# Patient Record
Sex: Female | Born: 1975
Health system: Southern US, Community
[De-identification: ages and names within clinical notes are randomized; demographics above are authoritative.]

## PROBLEM LIST (undated history)

## (undated) DIAGNOSIS — G43909 Migraine, unspecified, not intractable, without status migrainosus: Secondary | ICD-10-CM

## (undated) DIAGNOSIS — I509 Heart failure, unspecified: Secondary | ICD-10-CM

## (undated) DIAGNOSIS — K279 Peptic ulcer, site unspecified, unspecified as acute or chronic, without hemorrhage or perforation: Principal | ICD-10-CM

## (undated) DIAGNOSIS — E039 Hypothyroidism, unspecified: Secondary | ICD-10-CM

## (undated) DIAGNOSIS — J449 Chronic obstructive pulmonary disease, unspecified: Secondary | ICD-10-CM

## (undated) DIAGNOSIS — R569 Unspecified convulsions: Secondary | ICD-10-CM

## (undated) DIAGNOSIS — R5383 Other fatigue: Secondary | ICD-10-CM

## (undated) DIAGNOSIS — K5790 Diverticulosis of intestine, part unspecified, without perforation or abscess without bleeding: Secondary | ICD-10-CM

## (undated) DIAGNOSIS — F411 Generalized anxiety disorder: Secondary | ICD-10-CM

## (undated) DIAGNOSIS — D649 Anemia, unspecified: Secondary | ICD-10-CM

## (undated) DIAGNOSIS — E041 Nontoxic single thyroid nodule: Secondary | ICD-10-CM

## (undated) DIAGNOSIS — K219 Gastro-esophageal reflux disease without esophagitis: Secondary | ICD-10-CM

## (undated) DIAGNOSIS — Z9889 Other specified postprocedural states: Secondary | ICD-10-CM

## (undated) DIAGNOSIS — Z8639 Personal history of other endocrine, nutritional and metabolic disease: Secondary | ICD-10-CM

## (undated) DIAGNOSIS — I209 Angina pectoris, unspecified: Secondary | ICD-10-CM

## (undated) DIAGNOSIS — R112 Nausea with vomiting, unspecified: Secondary | ICD-10-CM

## (undated) DIAGNOSIS — R5381 Other malaise: Secondary | ICD-10-CM

## (undated) DIAGNOSIS — K760 Fatty (change of) liver, not elsewhere classified: Secondary | ICD-10-CM

## (undated) DIAGNOSIS — O903 Peripartum cardiomyopathy: Secondary | ICD-10-CM

## (undated) DIAGNOSIS — K5289 Other specified noninfective gastroenteritis and colitis: Secondary | ICD-10-CM

## (undated) DIAGNOSIS — N393 Stress incontinence (female) (male): Secondary | ICD-10-CM

## (undated) DIAGNOSIS — R0789 Other chest pain: Secondary | ICD-10-CM

## (undated) DIAGNOSIS — J45909 Unspecified asthma, uncomplicated: Secondary | ICD-10-CM

## (undated) DIAGNOSIS — E669 Obesity, unspecified: Secondary | ICD-10-CM

## (undated) DIAGNOSIS — R072 Precordial pain: Secondary | ICD-10-CM

## (undated) DIAGNOSIS — R609 Edema, unspecified: Secondary | ICD-10-CM

## (undated) DIAGNOSIS — Z720 Tobacco use: Secondary | ICD-10-CM

## (undated) DIAGNOSIS — M1990 Unspecified osteoarthritis, unspecified site: Secondary | ICD-10-CM

## (undated) DIAGNOSIS — R635 Abnormal weight gain: Secondary | ICD-10-CM

## (undated) HISTORY — DX: Generalized anxiety disorder: F41.1

## (undated) HISTORY — PX: APPENDECTOMY: SHX54

## (undated) HISTORY — DX: Migraine, unspecified, not intractable, without status migrainosus: G43.909

## (undated) HISTORY — DX: Edema, unspecified: R60.9

## (undated) HISTORY — DX: Unspecified convulsions: R56.9

## (undated) HISTORY — DX: Peripartum cardiomyopathy: O90.3

## (undated) HISTORY — PX: CHOLECYSTECTOMY: SHX55

## (undated) HISTORY — DX: Tobacco use: Z72.0

## (undated) HISTORY — DX: Other malaise: R53.81

## (undated) HISTORY — DX: Other fatigue: R53.83

## (undated) HISTORY — DX: Gastro-esophageal reflux disease without esophagitis: K21.9

## (undated) HISTORY — DX: Heart failure, unspecified: I50.9

## (undated) HISTORY — DX: Other specified noninfective gastroenteritis and colitis: K52.89

## (undated) HISTORY — PX: KNEE ARTHROSCOPY: SHX127

## (undated) HISTORY — DX: Abnormal weight gain: R63.5

## (undated) HISTORY — DX: Other chest pain: R07.89

## (undated) HISTORY — DX: Peptic ulcer, site unspecified, unspecified as acute or chronic, without hemorrhage or perforation: K27.9

## (undated) HISTORY — DX: Precordial pain: R07.2

## (undated) HISTORY — PX: ANKLE SURGERY: SHX546

---

## 1997-05-03 DIAGNOSIS — I509 Heart failure, unspecified: Secondary | ICD-10-CM

## 1997-05-03 HISTORY — DX: Heart failure, unspecified: I50.9

## 1997-08-12 ENCOUNTER — Inpatient Hospital Stay (HOSPITAL_COMMUNITY): Admission: AD | Admit: 1997-08-12 | Discharge: 1997-08-12 | Payer: Self-pay | Admitting: Obstetrics and Gynecology

## 1997-08-13 ENCOUNTER — Inpatient Hospital Stay (HOSPITAL_COMMUNITY): Admission: AD | Admit: 1997-08-13 | Discharge: 1997-08-15 | Payer: Self-pay | Admitting: Obstetrics and Gynecology

## 1997-09-01 ENCOUNTER — Inpatient Hospital Stay (HOSPITAL_COMMUNITY): Admission: AD | Admit: 1997-09-01 | Discharge: 1997-09-01 | Payer: Self-pay | Admitting: Obstetrics and Gynecology

## 1997-09-02 ENCOUNTER — Inpatient Hospital Stay (HOSPITAL_COMMUNITY): Admission: AD | Admit: 1997-09-02 | Discharge: 1997-09-10 | Payer: Self-pay | Admitting: *Deleted

## 1997-10-24 ENCOUNTER — Other Ambulatory Visit: Admission: RE | Admit: 1997-10-24 | Discharge: 1997-10-24 | Payer: Self-pay | Admitting: Obstetrics and Gynecology

## 1999-02-22 ENCOUNTER — Emergency Department (HOSPITAL_COMMUNITY): Admission: EM | Admit: 1999-02-22 | Discharge: 1999-02-22 | Payer: Self-pay | Admitting: Emergency Medicine

## 1999-02-27 ENCOUNTER — Encounter: Payer: Self-pay | Admitting: Cardiovascular Disease

## 1999-02-27 ENCOUNTER — Ambulatory Visit (HOSPITAL_COMMUNITY): Admission: RE | Admit: 1999-02-27 | Discharge: 1999-02-27 | Payer: Self-pay | Admitting: Cardiovascular Disease

## 1999-03-07 ENCOUNTER — Encounter: Payer: Self-pay | Admitting: Emergency Medicine

## 1999-03-08 ENCOUNTER — Inpatient Hospital Stay (HOSPITAL_COMMUNITY): Admission: EM | Admit: 1999-03-08 | Discharge: 1999-03-09 | Payer: Self-pay | Admitting: Emergency Medicine

## 2000-08-24 ENCOUNTER — Encounter: Payer: Self-pay | Admitting: Obstetrics and Gynecology

## 2000-08-24 ENCOUNTER — Encounter: Admission: RE | Admit: 2000-08-24 | Discharge: 2000-08-24 | Payer: Self-pay | Admitting: Obstetrics and Gynecology

## 2001-11-16 ENCOUNTER — Inpatient Hospital Stay (HOSPITAL_COMMUNITY): Admission: AD | Admit: 2001-11-16 | Discharge: 2001-11-16 | Payer: Self-pay

## 2002-01-25 ENCOUNTER — Ambulatory Visit (HOSPITAL_COMMUNITY): Admission: RE | Admit: 2002-01-25 | Discharge: 2002-01-25 | Payer: Self-pay

## 2002-02-15 ENCOUNTER — Ambulatory Visit (HOSPITAL_COMMUNITY): Admission: RE | Admit: 2002-02-15 | Discharge: 2002-02-15 | Payer: Self-pay

## 2002-05-31 ENCOUNTER — Inpatient Hospital Stay (HOSPITAL_COMMUNITY): Admission: AD | Admit: 2002-05-31 | Discharge: 2002-05-31 | Payer: Self-pay

## 2002-06-13 ENCOUNTER — Encounter (HOSPITAL_COMMUNITY): Admission: RE | Admit: 2002-06-13 | Discharge: 2002-06-20 | Payer: Self-pay

## 2002-06-27 ENCOUNTER — Inpatient Hospital Stay (HOSPITAL_COMMUNITY): Admission: AD | Admit: 2002-06-27 | Discharge: 2002-06-30 | Payer: Self-pay

## 2002-06-27 ENCOUNTER — Encounter (INDEPENDENT_AMBULATORY_CARE_PROVIDER_SITE_OTHER): Payer: Self-pay

## 2005-06-22 ENCOUNTER — Emergency Department (HOSPITAL_COMMUNITY): Admission: EM | Admit: 2005-06-22 | Discharge: 2005-06-22 | Payer: Self-pay | Admitting: Emergency Medicine

## 2005-06-23 ENCOUNTER — Emergency Department (HOSPITAL_COMMUNITY): Admission: EM | Admit: 2005-06-23 | Discharge: 2005-06-23 | Payer: Self-pay | Admitting: Emergency Medicine

## 2005-06-23 ENCOUNTER — Inpatient Hospital Stay (HOSPITAL_COMMUNITY): Admission: AD | Admit: 2005-06-23 | Discharge: 2005-06-25 | Payer: Self-pay | Admitting: Psychiatry

## 2005-06-24 ENCOUNTER — Ambulatory Visit: Payer: Self-pay | Admitting: Psychiatry

## 2005-07-07 ENCOUNTER — Ambulatory Visit (HOSPITAL_COMMUNITY): Payer: Self-pay | Admitting: Psychiatry

## 2005-09-16 ENCOUNTER — Emergency Department (HOSPITAL_COMMUNITY): Admission: EM | Admit: 2005-09-16 | Discharge: 2005-09-17 | Payer: Self-pay | Admitting: Emergency Medicine

## 2005-09-17 ENCOUNTER — Ambulatory Visit (HOSPITAL_COMMUNITY): Admission: RE | Admit: 2005-09-17 | Discharge: 2005-09-17 | Payer: Self-pay | Admitting: Family Medicine

## 2005-09-17 ENCOUNTER — Encounter: Payer: Self-pay | Admitting: Vascular Surgery

## 2005-09-24 ENCOUNTER — Ambulatory Visit: Payer: Self-pay | Admitting: Psychiatry

## 2005-09-24 ENCOUNTER — Inpatient Hospital Stay (HOSPITAL_COMMUNITY): Admission: RE | Admit: 2005-09-24 | Discharge: 2005-09-25 | Payer: Self-pay | Admitting: Psychiatry

## 2005-11-21 ENCOUNTER — Emergency Department (HOSPITAL_COMMUNITY): Admission: EM | Admit: 2005-11-21 | Discharge: 2005-11-21 | Payer: Self-pay | Admitting: Emergency Medicine

## 2006-07-28 ENCOUNTER — Ambulatory Visit (HOSPITAL_COMMUNITY): Payer: Self-pay | Admitting: Psychiatry

## 2006-08-12 ENCOUNTER — Ambulatory Visit (HOSPITAL_COMMUNITY): Payer: Self-pay | Admitting: Psychiatry

## 2006-10-14 ENCOUNTER — Ambulatory Visit (HOSPITAL_COMMUNITY): Payer: Self-pay | Admitting: Psychiatry

## 2006-11-12 ENCOUNTER — Emergency Department (HOSPITAL_COMMUNITY): Admission: EM | Admit: 2006-11-12 | Discharge: 2006-11-12 | Payer: Self-pay | Admitting: Emergency Medicine

## 2007-03-28 ENCOUNTER — Ambulatory Visit (HOSPITAL_COMMUNITY): Payer: Self-pay | Admitting: Psychiatry

## 2007-04-16 ENCOUNTER — Emergency Department (HOSPITAL_COMMUNITY): Admission: EM | Admit: 2007-04-16 | Discharge: 2007-04-16 | Payer: Self-pay | Admitting: Emergency Medicine

## 2007-04-18 ENCOUNTER — Ambulatory Visit (HOSPITAL_COMMUNITY): Payer: Self-pay | Admitting: Psychiatry

## 2007-05-18 ENCOUNTER — Ambulatory Visit (HOSPITAL_COMMUNITY): Payer: Self-pay | Admitting: Psychiatry

## 2007-06-14 ENCOUNTER — Ambulatory Visit (HOSPITAL_COMMUNITY): Payer: Self-pay | Admitting: Psychiatry

## 2007-09-28 ENCOUNTER — Ambulatory Visit: Payer: Self-pay | Admitting: Internal Medicine

## 2007-09-28 DIAGNOSIS — R5381 Other malaise: Secondary | ICD-10-CM

## 2007-09-28 DIAGNOSIS — R569 Unspecified convulsions: Secondary | ICD-10-CM

## 2007-09-28 DIAGNOSIS — R5383 Other fatigue: Secondary | ICD-10-CM

## 2007-09-28 DIAGNOSIS — K279 Peptic ulcer, site unspecified, unspecified as acute or chronic, without hemorrhage or perforation: Secondary | ICD-10-CM | POA: Insufficient documentation

## 2007-09-28 DIAGNOSIS — R635 Abnormal weight gain: Secondary | ICD-10-CM

## 2007-09-28 HISTORY — DX: Unspecified convulsions: R56.9

## 2007-09-28 HISTORY — DX: Other malaise: R53.81

## 2007-09-28 HISTORY — DX: Peptic ulcer, site unspecified, unspecified as acute or chronic, without hemorrhage or perforation: K27.9

## 2007-09-28 HISTORY — DX: Abnormal weight gain: R63.5

## 2007-09-28 LAB — CONVERTED CEMR LAB
Alkaline Phosphatase: 59 units/L (ref 39–117)
Bilirubin, Direct: 0.1 mg/dL (ref 0.0–0.3)
CO2: 27 meq/L (ref 19–32)
GFR calc Af Amer: 123 mL/min
Glucose, Bld: 83 mg/dL (ref 70–99)
Lymphocytes Relative: 26.4 % (ref 12.0–46.0)
Monocytes Absolute: 0.8 10*3/uL (ref 0.1–1.0)
Monocytes Relative: 11.8 % (ref 3.0–12.0)
Platelets: 238 10*3/uL (ref 150–400)
Potassium: 3.9 meq/L (ref 3.5–5.1)
RDW: 11.8 % (ref 11.5–14.6)
Sodium: 137 meq/L (ref 135–145)
Total Protein: 6.8 g/dL (ref 6.0–8.3)

## 2007-09-29 ENCOUNTER — Telehealth: Payer: Self-pay | Admitting: Internal Medicine

## 2007-10-04 ENCOUNTER — Ambulatory Visit: Payer: Self-pay | Admitting: Internal Medicine

## 2007-10-06 ENCOUNTER — Telehealth: Payer: Self-pay | Admitting: Internal Medicine

## 2007-10-12 ENCOUNTER — Ambulatory Visit: Payer: Self-pay | Admitting: Internal Medicine

## 2007-10-12 DIAGNOSIS — F419 Anxiety disorder, unspecified: Secondary | ICD-10-CM | POA: Insufficient documentation

## 2007-10-12 DIAGNOSIS — F411 Generalized anxiety disorder: Secondary | ICD-10-CM

## 2007-10-12 HISTORY — DX: Generalized anxiety disorder: F41.1

## 2007-10-12 LAB — CONVERTED CEMR LAB
BUN: 7 mg/dL (ref 6–23)
CO2: 29 meq/L (ref 19–32)
GFR calc Af Amer: 123 mL/min
Glucose, Bld: 88 mg/dL (ref 70–99)
Potassium: 3.6 meq/L (ref 3.5–5.1)

## 2008-05-12 ENCOUNTER — Emergency Department (HOSPITAL_COMMUNITY): Admission: EM | Admit: 2008-05-12 | Discharge: 2008-05-12 | Payer: Self-pay | Admitting: Emergency Medicine

## 2008-10-03 ENCOUNTER — Ambulatory Visit: Payer: Self-pay | Admitting: Internal Medicine

## 2008-10-03 DIAGNOSIS — K5289 Other specified noninfective gastroenteritis and colitis: Secondary | ICD-10-CM

## 2008-10-03 HISTORY — DX: Other specified noninfective gastroenteritis and colitis: K52.89

## 2009-12-03 ENCOUNTER — Ambulatory Visit: Payer: Self-pay | Admitting: Family Medicine

## 2010-01-30 ENCOUNTER — Encounter (INDEPENDENT_AMBULATORY_CARE_PROVIDER_SITE_OTHER): Payer: Self-pay | Admitting: Internal Medicine

## 2010-01-30 ENCOUNTER — Ambulatory Visit: Payer: Self-pay | Admitting: Cardiovascular Disease

## 2010-01-30 ENCOUNTER — Observation Stay (HOSPITAL_COMMUNITY): Admission: EM | Admit: 2010-01-30 | Discharge: 2010-01-30 | Payer: Self-pay | Admitting: Emergency Medicine

## 2010-02-02 ENCOUNTER — Encounter: Payer: Self-pay | Admitting: Cardiovascular Disease

## 2010-02-11 ENCOUNTER — Encounter: Payer: Self-pay | Admitting: Cardiology

## 2010-02-17 ENCOUNTER — Telehealth (INDEPENDENT_AMBULATORY_CARE_PROVIDER_SITE_OTHER): Payer: Self-pay | Admitting: *Deleted

## 2010-02-18 DIAGNOSIS — O903 Peripartum cardiomyopathy: Secondary | ICD-10-CM

## 2010-02-18 DIAGNOSIS — R0789 Other chest pain: Secondary | ICD-10-CM

## 2010-02-18 DIAGNOSIS — R609 Edema, unspecified: Secondary | ICD-10-CM

## 2010-02-18 HISTORY — DX: Edema, unspecified: R60.9

## 2010-02-18 HISTORY — DX: Peripartum cardiomyopathy: O90.3

## 2010-02-18 HISTORY — DX: Other chest pain: R07.89

## 2010-02-19 ENCOUNTER — Encounter: Payer: Self-pay | Admitting: Cardiovascular Disease

## 2010-02-19 ENCOUNTER — Ambulatory Visit: Payer: Self-pay | Admitting: Cardiovascular Disease

## 2010-02-19 DIAGNOSIS — R072 Precordial pain: Secondary | ICD-10-CM | POA: Insufficient documentation

## 2010-02-19 HISTORY — DX: Precordial pain: R07.2

## 2010-02-25 ENCOUNTER — Ambulatory Visit: Payer: Self-pay | Admitting: Cardiology

## 2010-02-25 ENCOUNTER — Telehealth: Payer: Self-pay | Admitting: Cardiovascular Disease

## 2010-02-25 ENCOUNTER — Inpatient Hospital Stay (HOSPITAL_COMMUNITY): Admission: EM | Admit: 2010-02-25 | Discharge: 2010-02-28 | Payer: Self-pay | Admitting: Emergency Medicine

## 2010-02-25 ENCOUNTER — Encounter: Payer: Self-pay | Admitting: Cardiovascular Disease

## 2010-03-02 ENCOUNTER — Ambulatory Visit (HOSPITAL_COMMUNITY): Admission: RE | Admit: 2010-03-02 | Discharge: 2010-03-02 | Payer: Self-pay | Admitting: Cardiology

## 2010-03-04 ENCOUNTER — Ambulatory Visit: Payer: Self-pay | Admitting: Internal Medicine

## 2010-03-04 DIAGNOSIS — G43909 Migraine, unspecified, not intractable, without status migrainosus: Secondary | ICD-10-CM

## 2010-03-04 HISTORY — DX: Migraine, unspecified, not intractable, without status migrainosus: G43.909

## 2010-03-06 ENCOUNTER — Encounter: Payer: Self-pay | Admitting: Internal Medicine

## 2010-03-12 ENCOUNTER — Inpatient Hospital Stay: Admission: RE | Admit: 2010-03-12 | Payer: Self-pay | Admitting: Cardiovascular Disease

## 2010-05-12 ENCOUNTER — Ambulatory Visit
Admission: RE | Admit: 2010-05-12 | Discharge: 2010-05-12 | Payer: Self-pay | Source: Home / Self Care | Attending: Licensed Clinical Social Worker | Admitting: Licensed Clinical Social Worker

## 2010-05-21 ENCOUNTER — Ambulatory Visit: Admit: 2010-05-21 | Payer: Self-pay | Admitting: Licensed Clinical Social Worker

## 2010-05-26 ENCOUNTER — Ambulatory Visit: Admit: 2010-05-26 | Payer: Self-pay | Admitting: Licensed Clinical Social Worker

## 2010-06-04 NOTE — Letter (Signed)
Summary: [employer] Description Form   [employer] Description Form   Imported By: Roderic Ovens 03/18/2010 11:39:20  _____________________________________________________________________  External Attachment:    Type:   Image     Comment:   External Document

## 2010-06-04 NOTE — Progress Notes (Signed)
Summary: pt having chest pressure  Phone Note Call from Patient Call back at Home Phone [phone]   Caller: Patient Reason for Call: Talk to Nurse, Talk to Doctor Summary of Call: pt is having pressure in her chest and has taken off work 3days and she is very dizzy pt also has tiggling in her hands pt has not taken any nitro Initial call taken by: Omer Jack,  February 25, 2010 10:46 AM  Follow-up for Phone Call        Pt having Chest Pressure on & off for the past 3 days radiating to lt.arm, upset stomach, dizziness usually when she stands up & she also has a bad H/A. She is stating she just doesn't "feel right." She does not have any SL Ntg. Dr. Clifton James saw her on Thursday for an abnormal stress test & is supposed to cath her on Nov.10th. I have advised pt. to go to the ER to get checked out. I have notified Trish & the ER. Whitney Maeola Sarah RN  February 25, 2010 10:59 AM  Follow-up by: Whitney Maeola Sarah RN,  February 25, 2010 10:54 AM

## 2010-06-04 NOTE — Letter (Signed)
Summary: ER Notification  [facility]ural [facility], Main Office  [address]   [place], [place] [address]   Phone: [phone]  Fax: [phone]    February 25, 2010 11:00 AM  [facility]  The above referenced patient has been advised to report directly to the Emergency Room. Please see below for more information:  Dx: CP x 3days, abnormal stress test   Private Vehicle  _______X________ or EMS:  ________________   Orders:  Yes ______ or No  ______X_   Notify upon arrival:     Trish [phone]- done         Thank you,    Forensic psychologist

## 2010-06-04 NOTE — Letter (Signed)
Summary: [facility]  [facility]   Imported By: Sherian Rein 03/17/2010 12:16:28  _____________________________________________________________________  External Attachment:    Type:   Image     Comment:   External Document

## 2010-06-04 NOTE — Assessment & Plan Note (Signed)
Summary: eph/per night message/lg   Visit Type:  Initial Consult/ eph Primary Provider:  Gordy Savers  MD  CC:  chest pressure and sob.  History of Present Illness: 35 yo WF with history GERD, famly history of CAD, tobacco abuse who was recently discharged from [facility] for chest pain. She ruled out for an MI with enzymes and had a normal echo. Her chest pain was felt to be secondary to GERD. She was discharged home with plans for an outpatient stress test. She went home and had recurrent chest pain and went to [facility] medical center where she was admitted for rule out. (records not available) She apparently had a stress echo that was normal. She was discharged to home with plans to follow up with me today. She continues to have left sided chest pressure with rest. No radiation of the pressure. No associated  diaphoresis, nausea. It does make her left fingers feel numb and makes her feel dyspneic. She overall feels fatigued.   Current Medications (verified): 1)  Aleve 220 Mg Tabs (Naproxen Sodium) .... 2 Tab As Needed  Allergies (verified): 1)  ! Ms Contin (Morphine Sulfate)  Past History:  Past Medical History: Pre-eclampsia in 2000 during pregnancy, pulmonary edema GERD Tobacco abuse  Past Surgical History: Reviewed history from 02/18/2010 and no changes required. gravida 3, para 3, abortus zero, with 3 C-sections Appendectomy Cholecystectomy Knee Arthroscopy Ankle surgery  Family History:  Father had a myocardial infarction at age  14 and recently died.   Mother is alive, had a cerebral aneurysm and  colon cancer.  2 sisters-both healthy 3 brothers-one died from muscular dystrophy, one died at age 65 and died from complications of diabetes, one died at age 37 from drunk diriver.   Social History: Divorced, 3 kids Works at [employer] Tobacco use- 1ppd for 20 years Alcohol Use - no Drug Use - no        Review of Systems       The patient  complains of fatigue, chest pain, and shortness of breath.  The patient denies malaise, fever, weight gain/loss, vision loss, decreased hearing, hoarseness, palpitations, prolonged cough, wheezing, sleep apnea, coughing up blood, abdominal pain, blood in stool, nausea, vomiting, diarrhea, heartburn, incontinence, blood in urine, muscle weakness, joint pain, leg swelling, rash, skin lesions, headache, fainting, dizziness, depression, anxiety, enlarged lymph nodes, easy bruising or bleeding, and environmental allergies.    Vital Signs:  Patient profile:   35 year old female Height:      66 inches Weight:      172 pounds BMI:     27.86 Pulse rate:   83 / minute BP sitting:   122 / 78  (left arm) Cuff size:   regular  Vitals Entered By: Burnett Kanaris, CNA (February 19, 2010 3:54 PM)  Physical Exam  General:  General: Well developed, well nourished, NAD HEENT: OP clear, mucus membranes moist SKIN: warm, dry Neuro: No focal deficits Musculoskeletal: Muscle strength 5/5 all ext Psychiatric: Mood and affect normal Neck: No JVD, no carotid bruits, no thyromegaly, no lymphadenopathy. Lungs:Clear bilaterally, no wheezes, rhonci, crackles CV: RRR no murmurs, gallops rubs Abdomen: soft, NT, ND, BS present Extremities: No edema, pulses 2+.    EKG  Procedure date:  02/19/2010  Findings:      Normal sinus rhythm  Echocardiogram  Procedure date:  01/30/2010  Findings:       Left ventricle: The cavity size was normal. Wall thickness was   normal.  Systolic function was normal. The estimated ejection   fraction was in the range of 60% to 65%. Wall motion was normal;   there were no regional wall motion abnormalities. Left ventricular   diastolic function parameters were normal.  Impression & Recommendations:  Problem # 1:  CHEST PAIN-PRECORDIAL (ICD-786.51) Her risk factors for CAD include tobacco abuse and a family history of CAD. She has had a recent stress echo at [facility] which showed no ischemia. Her chest pain is occurring daily, with rest and with exertion. I cannot exclude CAD without an angiogram. She is very interested in excluding CAD. I have arranged a diagnostic left heart cath for November 10th in the outpatient cath lab at [facility]. Risks and benefits reviewed. She wishes to proceed. Will check BMET, CBC and coags that week.   Tobacco cessation encouraged.   Other Orders: EKG w/ Interpretation (93000) Cardiac Catheterization (Cardiac Cath)  Patient Instructions: 1)  Your physician recommends that you schedule a follow-up appointment in: 5-6 weeks.  2)  Your physician recommends that you return for lab work on Nov.8th, 2011 @ any time after 8:30 am. 3)  Your physician recommends that you continue on your current medications as directed. Please refer to the Current Medication list given to you today. 4)  Your physician has requested that you have a cardiac catheterization on Nov.10th, 2011 @ 10:30am. Be there at 9:30am.  Cardiac catheterization is used to diagnose and/or treat various heart conditions. Doctors may recommend this procedure for a number of different reasons. The most common reason is to evaluate chest pain. Chest pain can be a symptom of coronary artery disease (CAD), and cardiac catheterization can show whether plaque is narrowing or blocking your heart's arteries. This procedure is also used to evaluate the valves, as well as measure the blood flow and oxygen levels in different parts of your heart.  For further information please visit [email or web address]  Please follow instruction sheet, as given.
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
## 2010-06-04 NOTE — Progress Notes (Signed)
  ROI faxed to [facility] @ [phone] [facility]  February 17, 2010 9:51 AM     Appended Document:  Received Stress form [facility] gave to [place] (covering for [place])

## 2010-06-04 NOTE — Letter (Signed)
Summary: [facility] - Exercise Stress Echo W/Doppler  [facility] - Exercise Stress Echo W/Doppler   Imported By: Marylou Mccoy 03/05/2010 13:08:47  _____________________________________________________________________  External Attachment:    Type:   Image     Comment:   External Document

## 2010-06-04 NOTE — Assessment & Plan Note (Signed)
Summary: post hos fup/njr   Vital Signs:  Patient profile:   35 year old female Weight:      168 pounds Temp:     98.2 degrees F oral BP sitting:   106 / 80  (left arm) Cuff size:   regular  Vitals Entered By: Duard Brady LPN (March 04, 2010 10:56 AM) CC: post hospital - heart Lisa Ryan - neuro still evaling - doing better Is Patient Diabetic? No   Primary Care Provider:  Gordy Savers  MD  CC:  post hospital - heart Lisa Ryan - neuro still evaling - doing better.  History of Present Illness: 35 -year-old patient who is seen today posthospital l discharge.  She was admitted for evaluation of atypical chest pain, and did have a heart catheterization, performed with normal results.  She also was seen by urology due to a severe headache and was felt to have a migraine syndrome.  Her headaches have largely resolved, and only has minor and improving headache pain.  She does have a family history of a first degree relative with cerebral  aneurysms and was quite concerned about this possibility.  A head CT without contrast was negative in an inpatient setting.  Subsequently, she has had a MRI without contrast that was fairly unremarkable.  She request a neurology follow up  Allergies: 1)  ! Ms Contin (Morphine Sulfate)  Past History:  Past Medical History: Pre-eclampsia in 2000 during pregnancy, pulmonary edema GERD Tobacco abuse migraine headaches atypical chest pain  Family History: Reviewed history from 02/19/2010 and no changes required.  Father had a myocardial infarction at age  52 and recently died.   Mother is alive, had a cerebral aneurysm and  colon cancer.  2 sisters-both healthy 3 brothers-one died from muscular dystrophy, one died at age 58 and died from complications of diabetes, one died at age 3 from drunk diriver.   Review of Systems       The patient complains of chest pain and headaches.  The patient denies anorexia, fever, weight loss, weight  gain, vision loss, decreased hearing, hoarseness, syncope, dyspnea on exertion, peripheral edema, prolonged cough, hemoptysis, abdominal pain, melena, hematochezia, severe indigestion/heartburn, hematuria, incontinence, genital sores, muscle weakness, suspicious skin lesions, transient blindness, difficulty walking, depression, unusual weight change, abnormal bleeding, enlarged lymph nodes, angioedema, and breast masses.    Physical Exam  General:  Well-developed,well-nourished,in no acute distress; alert,appropriate and cooperative throughout examination Head:  Normocephalic and atraumatic without obvious abnormalities. No apparent alopecia or balding. Mouth:  Oral mucosa and oropharynx without lesions or exudates.  Teeth in good repair. Neck:  No deformities, masses, or tenderness noted. Lungs:  Normal respiratory effort, chest expands symmetrically. Lungs are clear to auscultation, no crackles or wheezes. Heart:  Normal rate and regular rhythm. S1 and S2 normal without gallop, murmur, click, rub or other extra sounds. Psych:  anxious   Impression & Recommendations:  Problem # 1:  CHEST PAIN-PRECORDIAL (ICD-786.51)  Her updated medication list for this problem includes:    Aleve 220 Mg Tabs (Naproxen sodium) .Marland Kitchen... 2 tab as needed  Problem # 2:  MIGRAINE HEADACHE (ICD-346.90)  Her updated medication list for this problem includes:    Aleve 220 Mg Tabs (Naproxen sodium) .Marland Kitchen... 2 tab as needed  Problem # 3:  ANXIETY STATE, UNSPECIFIED (ICD-300.00)  Complete Medication List: 1)  Aleve 220 Mg Tabs (Naproxen sodium) .... 2 tab as needed 2)  Prednisone (pak) 5 Mg Tabs (Prednisone) .... As directed  Patient Instructions: 1)  Please schedule a follow-up appointment in 2 weeks. 2)  Neurology follow up as scheduled   Orders Added: 1)  Est. Patient Level IV [[address]]

## 2010-06-04 NOTE — Letter (Signed)
Summary: Cardiac Catheterization Instructions- [facility]  [employer], Main Office  [address]   [place], [place] [address]   Phone: [phone]  Fax: [phone]     02/19/2010 MRN: [id number]  [facility] [address] [place], [place]  [address]  Dear Ms. MCCRAY,   You are scheduled for a Cardiac Catheterization on Nov.10th, 2011 with Dr. Clifton James. Please arrive to the 1st floor of the Heart and Vascular Center at [facility] at 9:30 am on the day of your procedure. Please do not arrive before 6:30 a.m. Call the Heart and Vascular Center at [phone] if you are unable to make your appointmnet. The Code to get into the parking garage under the building is 2000. Take the elevators to the 1st floor. You must have someone to drive you home. Someone must be with you for the first 24 hours after you arrive home. Please wear clothes that are easy to get on and off and wear slip-on shoes. Do not eat or drink after midnight except water with your medications that morning. Bring all your medications and current insurance cards with you.  __X_ Make sure you take your aspirin.  _X__ You may take ALL of your medications with water that morning.   The usual length of stay after your procedure is 2 to 3 hours. This can vary.  If you have any questions, please call the office at the number listed above.   Whitney Maeola Sarah RN

## 2010-06-04 NOTE — Letter (Signed)
Summary: Out of Work  Adult nurse at [employer]  [address]   [place], [place] [address]   Phone: [phone]  Fax: [phone]    March 04, 2010   Employee:  KAAREN NASS [facility] [employer]    To Whom It May Concern:   For Medical reasons, please excuse the above named employee from work for the following dates:  Start:   02-23-10  End:   03-10-10  If you need additional information, please feel free to contact our office.         Sincerely,    Gordy Savers  MD

## 2010-07-15 LAB — CBC
HCT: 35.3 % — ABNORMAL LOW (ref 36.0–46.0)
HCT: 39.8 % (ref 36.0–46.0)
Hemoglobin: 12 g/dL (ref 12.0–15.0)
Hemoglobin: 13.6 g/dL (ref 12.0–15.0)
MCH: 33 pg (ref 26.0–34.0)
MCH: 33.2 pg (ref 26.0–34.0)
MCHC: 34 g/dL (ref 30.0–36.0)
MCHC: 34.2 g/dL (ref 30.0–36.0)
MCV: 97.1 fL (ref 78.0–100.0)

## 2010-07-15 LAB — CARDIAC PANEL(CRET KIN+CKTOT+MB+TROPI)
CK, MB: 0.6 ng/mL (ref 0.3–4.0)
Relative Index: INVALID (ref 0.0–2.5)
Total CK: 33 U/L (ref 7–177)
Total CK: 34 U/L (ref 7–177)
Troponin I: <0.01 ng/mL (ref 0.00–0.06)

## 2010-07-15 LAB — D-DIMER, QUANTITATIVE: D-Dimer, Quant: 0.22 ug/mL-FEU (ref 0.00–0.48)

## 2010-07-15 LAB — DIFFERENTIAL
Basophils Relative: 0 % (ref 0–1)
Eosinophils Absolute: 0.3 10*3/uL (ref 0.0–0.7)
Eosinophils Relative: 3 % (ref 0–5)
Neutrophils Relative %: 67 % (ref 43–77)

## 2010-07-15 LAB — BASIC METABOLIC PANEL
BUN: 7 mg/dL (ref 6–23)
Calcium: 8.5 mg/dL (ref 8.4–10.5)
GFR calc non Af Amer: >60 mL/min (ref >60)
Glucose, Bld: 84 mg/dL (ref 70–99)
Sodium: 139 mEq/L (ref 135–145)

## 2010-07-15 LAB — COMPREHENSIVE METABOLIC PANEL
ALT: 24 U/L (ref 0–35)
AST: 18 U/L (ref 0–37)
Alkaline Phosphatase: 80 U/L (ref 39–117)
CO2: 24 mEq/L (ref 19–32)
Chloride: 109 mEq/L (ref 96–112)
GFR calc non Af Amer: >60 mL/min (ref >60)
Glucose, Bld: 74 mg/dL (ref 70–99)
Potassium: 3.5 mEq/L (ref 3.5–5.1)
Sodium: 140 mEq/L (ref 135–145)
Total Bilirubin: 0.5 mg/dL (ref 0.3–1.2)

## 2010-07-15 LAB — CK TOTAL AND CKMB (NOT AT ARMC): Relative Index: INVALID (ref 0.0–2.5)

## 2010-07-15 LAB — PROTIME-INR: Prothrombin Time: 12.3 seconds (ref 11.6–15.2)

## 2010-07-16 LAB — CBC
HCT: 33.9 % — ABNORMAL LOW (ref 36.0–46.0)
MCH: 33 pg (ref 26.0–34.0)
MCHC: 33.9 g/dL (ref 30.0–36.0)
MCV: 94.7 fL (ref 78.0–100.0)
MCV: 95.5 fL (ref 78.0–100.0)
Platelets: 308 10*3/uL (ref 150–400)
RDW: 12.2 % (ref 11.5–15.5)
RDW: 12.3 % (ref 11.5–15.5)

## 2010-07-16 LAB — BASIC METABOLIC PANEL
GFR calc Af Amer: >60 mL/min (ref >60)
GFR calc non Af Amer: >60 mL/min (ref >60)
Potassium: 3.9 mEq/L (ref 3.5–5.1)
Sodium: 138 mEq/L (ref 135–145)

## 2010-07-16 LAB — COMPREHENSIVE METABOLIC PANEL
BUN: 5 mg/dL — ABNORMAL LOW (ref 6–23)
Calcium: 8.1 mg/dL — ABNORMAL LOW (ref 8.4–10.5)
Creatinine, Ser: 0.55 mg/dL (ref 0.4–1.2)
Glucose, Bld: 86 mg/dL (ref 70–99)
Sodium: 138 mEq/L (ref 135–145)
Total Protein: 5.1 g/dL — ABNORMAL LOW (ref 6.0–8.3)

## 2010-07-16 LAB — LIPID PANEL
Triglycerides: 45 mg/dL (ref <150)
VLDL: 9 mg/dL (ref 0–40)

## 2010-07-16 LAB — TROPONIN I: Troponin I: 0.01 ng/mL (ref 0.00–0.06)

## 2010-07-16 LAB — MAGNESIUM: Magnesium: 2 mg/dL (ref 1.5–2.5)

## 2010-07-16 LAB — POCT CARDIAC MARKERS
Myoglobin, poc: 45.7 ng/mL (ref 12–200)
Troponin i, poc: <0.05 ng/mL (ref 0.00–0.09)

## 2010-07-16 LAB — POCT I-STAT, CHEM 8
HCT: 42 % (ref 36.0–46.0)
Hemoglobin: 14.3 g/dL (ref 12.0–15.0)
Sodium: 141 mEq/L (ref 135–145)
TCO2: 27 mmol/L (ref 0–100)

## 2010-07-16 LAB — DIFFERENTIAL
Basophils Absolute: 0 10*3/uL (ref 0.0–0.1)
Basophils Relative: 0 % (ref 0–1)
Eosinophils Absolute: 0.4 10*3/uL (ref 0.0–0.7)
Eosinophils Relative: 4 % (ref 0–5)

## 2010-07-16 LAB — CARDIAC PANEL(CRET KIN+CKTOT+MB+TROPI)
CK, MB: 0.6 ng/mL (ref 0.3–4.0)
CK, MB: 0.6 ng/mL (ref 0.3–4.0)
Relative Index: INVALID (ref 0.0–2.5)
Total CK: 33 U/L (ref 7–177)
Total CK: 35 U/L (ref 7–177)
Troponin I: 0.01 ng/mL (ref 0.00–0.06)
Troponin I: 0.01 ng/mL (ref 0.00–0.06)
Troponin I: 0.02 ng/mL (ref 0.00–0.06)

## 2010-07-16 LAB — LIPASE, BLOOD: Lipase: 20 U/L (ref 11–59)

## 2010-07-16 LAB — CK TOTAL AND CKMB (NOT AT ARMC)
CK, MB: 0.7 ng/mL (ref 0.3–4.0)
Relative Index: INVALID (ref 0.0–2.5)

## 2010-09-18 NOTE — Op Note (Signed)
[name], Lisa Ryan                       ACCOUNT NO.:  [id number]   MEDICAL RECORD NO.:  [id number]                   PATIENT TYPE:  INP   LOCATION:  9105                                 FACILITY:  [facility]   PHYSICIAN:  Ronda Fairly. Galen Daft, M.D.              DATE OF BIRTH:  10/23/1975   DATE OF PROCEDURE:  DATE OF DISCHARGE:                                 OPERATIVE REPORT   PREOPERATIVE DIAGNOSES:  1. Prior cesarean section.  2. Desires elective sterilization.   POSTOPERATIVE DIAGNOSES:  1. Prior cesarean section.  2. Desires elective sterilization.   PROCEDURE:  1. Repeat low transverse cesarean section.  2. Bilateral tubal ligation, modified Pomeroy method.   SURGEON:  Ronda Fairly. Galen Daft, M.D.   ASSISTANT:  Rudy Jew. Ashley Royalty, M.D.   COMPLICATIONS:  None.   ANESTHESIA:  Epidural.   ESTIMATED BLOOD LOSS:  900 mL.   FINDINGS:  Female infant.  Weight of 8 pounds 4 ounces.  Vertex presentation.  There were midline scars on the uterus and normal tubes and ovaries.   PROCEDURE:  The patient was prepped and draped, identified, and Betadine  prep sterile technique was utilized.  Pfannenstiel incision was utilized for  abdominal access.  The abdomen was entered with care and caution.  There  were significant adhesions of the rectus muscle to the anterior uterine wall  at approximately midway up the uterus.  Otherwise, adhesions were relatively  free from any other structures of the uterus.  The adhesions had been lysed  and care was taken to avoid injury to bladder.  Bladder flap was developed  over the visceroperitoneum overlying the uterus and the uterus was entered  with a low cervical transverse uterine incision.  The baby was delivered in  a vertex presentation, 8 pound 4 ounce boy.  There were no complications.  The placenta was extracted and the uterus was swept free of any contents.  The uterus was then closed with 0 Vicryl in a running locking fashion  followed by an  imbrication with figure-of-eight suture.  There was complete  hemostasis noted.  Left tube was grasped with a Babcock, identified down to  its fimbriated end, ligated x2 using the 0 plain catgut.  The right tube was  identified in the same fashion.  Care was taken to avoid adjacent structures  and to identify the tubes down to their fimbriated end.  The ends that were  free were cauterized with Bovie cautery.  The areas were completely  hemostatic.  Irrigation and  suction was performed.  Uterus was placed back in the abdominal cavity and  the subfascial tissues were all hemostatic as the fascia was closed with  number 1 Vicryl.  All instrument, sponge, and needle counts were correct at  the end of the case.  The patient tolerated procedure well.  Left the  operating room in stable condition.  Ronda Fairly. Galen Daft, M.D.    NJT/MEDQ  D:  06/27/2002  T:  06/27/2002  Job:  [id number]   cc:   Fayrene Fearing A. Ashley Royalty, M.D.  [address]  [place], [place] [address]  Fax: [phone]   Artist Pais, M.D.  [address]  [place]  [place] [address]  Fax: [phone]
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
## 2010-09-18 NOTE — Discharge Summary (Signed)
   Lisa Ryan, Lisa Ryan                       ACCOUNT NO.:  [id number]   MEDICAL RECORD NO.:  [id number]                   PATIENT TYPE:  INP   LOCATION:  9105                                 FACILITY:  [facility]   PHYSICIAN:  Ronda Fairly. Galen Daft, M.D.              DATE OF BIRTH:  February 18, 1976   DATE OF ADMISSION:  06/27/2002  DATE OF DISCHARGE:  06/30/2002                                 DISCHARGE SUMMARY   ADMISSION DIAGNOSES:  Term pregnancy, prior cesarean section.   PRINCIPAL DIAGNOSIS:  Term pregnancy, prior cesarean section.  </COMPLICATIONS OF HOSPITALIZATION>  None.   PRINCIPAL PROCEDURE:  Repeat cesarean section.   SECONDARY PROCEDURE:  Bilateral sterilization elective.   CONDITION ON DISCHARGE:  Improved.   FINAL DIAGNOSES:  1. Term pregnancy.  2. Prior cesarean section.  3. Elective sterilization, desires sterilization, multiparity.   HOSPITAL COURSE:  The patient was admitted for repeat cesarean section and  bilateral tubal ligation which was carried out without difficulty.  The  estimated blood loss was 900 cc.  Surgery was performed on 06/27/02.  On the  first postoperative day she was doing very well, she had increased her diet  and she had some trouble with some p.o. analgesics, therefore we continue  with the epidural analgesic for the first day, this worked out very well for  her.  Her hemoglobin on the first postoperative day was 8.8 grams with a  white count of 8100.  She was doing very well on the second postoperative  day and everything was intact on her examination.  On the third  postoperative day she was given full instructions regarding activity limits,  wound care, follow up in the office and medications.   DISCHARGE MEDICATIONS:  She was discharged with prescriptions for Stadol  nasal spray and iron tablets and prenatal vitamins.   FOLLOW UP:  Follow up in the office in six weeks or earlier if there are any  problems.                   Ronda Fairly. Galen Daft, M.D.    NJT/MEDQ  D:  07/27/2002  T:  07/28/2002  Job:  [id number]
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
## 2011-02-16 ENCOUNTER — Telehealth: Payer: Self-pay | Admitting: Internal Medicine

## 2011-02-16 ENCOUNTER — Other Ambulatory Visit: Payer: Self-pay | Admitting: *Deleted

## 2011-02-16 ENCOUNTER — Ambulatory Visit (INDEPENDENT_AMBULATORY_CARE_PROVIDER_SITE_OTHER): Payer: 59 | Admitting: Internal Medicine

## 2011-02-16 ENCOUNTER — Encounter: Payer: Self-pay | Admitting: Internal Medicine

## 2011-02-16 DIAGNOSIS — K279 Peptic ulcer, site unspecified, unspecified as acute or chronic, without hemorrhage or perforation: Secondary | ICD-10-CM

## 2011-02-16 DIAGNOSIS — R109 Unspecified abdominal pain: Secondary | ICD-10-CM

## 2011-02-16 MED ORDER — OMEPRAZOLE 40 MG PO CPDR: 40.0000 mg | DELAYED_RELEASE_CAPSULE | Freq: Every day | ORAL | Status: DC

## 2011-02-16 NOTE — Patient Instructions (Signed)
Avoids foods high in acid such as tomatoes citrus juices, and spicy foods.  Avoid eating within two hours of lying down or before exercising.  Do not overheat.  Try smaller more frequent meals.  If symptoms persist, elevate the head of her bed 12 inches while sleeping.  Smoking tobacco is very bad for your health. You should stop smoking immediately.Peptic Ulcers Ulcers are small, open, and painful sores that develop in the stomach (gastric ulcer) or upper part of the small intestine (duodenal ulcer). The terms peptic ulcer describes both types of ulcers. They cause sharp or burning stomach pain. HOME CARE  Quit smoking if you smoke.   Avoid alcohol, aspirin, and other drugs that lessen puffiness and soreness (anti-inflammatory drugs).   Eat regular, healthy meals.   Avoid foods that bother you.   Take all medicine as directed by your doctor. If your doctor prescribed an antacid, do not switch brands without your doctor's approval.  GET HELP RIGHT AWAY IF:  You see bright red blood in your throw up (vomit).   You have bloody or tarry, black looking poop (stool).   You feel weak, tired, or pass out (faint).   You have sudden, intense belly (abdominal) pain.   You have intense pain and throw up repeatedly.  MAKE SURE YOU:    Understand these instructions.   Will watch your condition.   Will get help right away if you are not doing well or get worse.  Document Released: 07/14/2009   [facility] Patient Information 2011 [place], [place].

## 2011-02-16 NOTE — Telephone Encounter (Signed)
Pt would like samples of prilosec

## 2011-02-16 NOTE — Telephone Encounter (Signed)
Pt has been having epigastric pain x 3-4 days, and could not sleep last night due to the increased pain.  Will work in with Dr. Kirtland Bouchard today.

## 2011-02-16 NOTE — Progress Notes (Signed)
  Subjective:    Patient ID: Lisa Ryan, female    DOB: 12/05/1975, 35 y.o.   MRN: [id number]  HPI  35 year old patient who presents with a three-day history of crampy abdominal pain. Pain is maximally epigastric and left upper quadrant. Pain occasionally awakens her from sleep during the night. Pain is aggravated by deep inspiration. Associated symptoms include some nausea but no change in her bowel habits. No vomiting no fever or diarrhea. She has taking Zantac Pepto-Bismol and Mylanta without much benefit. She has a history of peptic ulcer disease in her 37s.    Review of Systems  Gastrointestinal: Positive for abdominal pain.       Objective:   Physical Exam  Constitutional: She is oriented to person, place, and time. She appears well-developed and well-nourished.  HENT:  Head: Normocephalic.  Right Ear: External ear normal.  Left Ear: External ear normal.  Mouth/Throat: Oropharynx is clear and moist.  Eyes: Conjunctivae and EOM are normal. Pupils are equal, round, and reactive to light.  Neck: Normal range of motion. Neck supple. No thyromegaly present.  Cardiovascular: Normal rate, regular rhythm, normal heart sounds and intact distal pulses.   Pulmonary/Chest: Effort normal and breath sounds normal.  Abdominal: Soft. Bowel sounds are normal. She exhibits no mass. There is tenderness.       Epigastric and left upper quadrant tenderness. No organomegaly  Musculoskeletal: Normal range of motion.  Lymphadenopathy:    She has no cervical adenopathy.  Neurological: She is alert and oriented to person, place, and time.  Skin: Skin is warm and dry. No rash noted.  Psychiatric: She has a normal mood and affect. Her behavior is normal.          Assessment & Plan:   Abdominal pain. History of peptic ulcer disease. Will place on a bland diet and PPI therapy for 30 days. If she fails to improve will need upper endoscopy Cessation of tobacco encouraged

## 2011-02-17 NOTE — Telephone Encounter (Signed)
Pt walked in requesting samples no samples available

## 2011-02-18 ENCOUNTER — Telehealth: Payer: Self-pay | Admitting: Internal Medicine

## 2011-02-18 NOTE — Telephone Encounter (Signed)
Patient was seen by Dr Amador Cunas on 02/16/11.  She was started on PPI for epigastric pain. She says pain is worsening.  She wants to be seen today.  I have advised her that I can schedule her with Mike Gip PA tomorrow am at 9:30.  She is asked to arrive at 9:15.  Patient states she has seen Dr Juanda Chance in the very remote past.  I have asked her to arrive at 9:15

## 2011-02-19 ENCOUNTER — Ambulatory Visit (HOSPITAL_COMMUNITY)
Admission: RE | Admit: 2011-02-19 | Discharge: 2011-02-19 | Disposition: A | Discharge status: Home / Self Care | Payer: 59 | Source: Ambulatory Visit | Attending: Physician Assistant | Admitting: Physician Assistant

## 2011-02-19 ENCOUNTER — Other Ambulatory Visit (INDEPENDENT_AMBULATORY_CARE_PROVIDER_SITE_OTHER): Payer: 59

## 2011-02-19 ENCOUNTER — Ambulatory Visit (INDEPENDENT_AMBULATORY_CARE_PROVIDER_SITE_OTHER): Payer: 59 | Admitting: Physician Assistant

## 2011-02-19 ENCOUNTER — Encounter: Payer: Self-pay | Admitting: Physician Assistant

## 2011-02-19 VITALS — BP 100/80 | HR 88 | Ht 67.5 in | Wt 160.0 lb

## 2011-02-19 DIAGNOSIS — R109 Unspecified abdominal pain: Secondary | ICD-10-CM

## 2011-02-19 DIAGNOSIS — R1010 Upper abdominal pain, unspecified: Secondary | ICD-10-CM

## 2011-02-19 DIAGNOSIS — N83209 Unspecified ovarian cyst, unspecified side: Secondary | ICD-10-CM | POA: Insufficient documentation

## 2011-02-19 DIAGNOSIS — K7689 Other specified diseases of liver: Secondary | ICD-10-CM | POA: Insufficient documentation

## 2011-02-19 DIAGNOSIS — E278 Other specified disorders of adrenal gland: Secondary | ICD-10-CM | POA: Insufficient documentation

## 2011-02-19 DIAGNOSIS — K275 Chronic or unspecified peptic ulcer, site unspecified, with perforation: Secondary | ICD-10-CM

## 2011-02-19 DIAGNOSIS — Q619 Cystic kidney disease, unspecified: Secondary | ICD-10-CM | POA: Insufficient documentation

## 2011-02-19 LAB — COMPREHENSIVE METABOLIC PANEL
ALT: 19 U/L (ref 0–35)
AST: 17 U/L (ref 0–37)
Calcium: 9 mg/dL (ref 8.4–10.5)
Chloride: 105 mEq/L (ref 96–112)
Creatinine, Ser: 0.6 mg/dL (ref 0.4–1.2)

## 2011-02-19 LAB — CBC WITH DIFFERENTIAL/PLATELET
Basophils Absolute: 0 10*3/uL (ref 0.0–0.1)
Eosinophils Absolute: 0.3 10*3/uL (ref 0.0–0.7)
Hemoglobin: 13.5 g/dL (ref 12.0–15.0)
Lymphocytes Relative: 28 % (ref 12.0–46.0)
MCHC: 34.2 g/dL (ref 30.0–36.0)
Neutro Abs: 3.8 10*3/uL (ref 1.4–7.7)
Neutrophils Relative %: 59.6 % (ref 43.0–77.0)
RDW: 12.9 % (ref 11.5–14.6)

## 2011-02-19 LAB — H. PYLORI ANTIBODY, IGG: H Pylori IgG: NEGATIVE

## 2011-02-19 MED ORDER — IOHEXOL 300 MG/ML  SOLN: 100.0000 mL | Freq: Once | INTRAMUSCULAR | Status: AC | PRN

## 2011-02-19 NOTE — Progress Notes (Signed)
  Subjective:    Patient ID: Lisa Ryan, female    DOB: Jul 20, 1975, 35 y.o.   MRN: [id number]  HPI Lisa Ryan is a 35 year old white female known remotely to Dr. Lina Sar from the 1980s at which time she had a perforated ulcer. By her report she had this oversewed. She is also status post appendectomy and cholecystectomy.. Patient comes in today as an urgent add-on with the onset of upper abdominal pain on Sunday-6 days ago. She says the pain has gradually gotten worse this week and is described as crampy and burning. This is located in the left upper quadrant and epigastrium radiating across her abdomen. She says the pain seems to be worse at night and is waking her from sleep. She has not had any fever or chills no diarrhea no melena or hematochezia she has been nauseated but has not been vomiting and her appetite has been poor. She started on Prilosec 2 days ago. She has not tried any over-the-counter medication for pain. She does take Aleve intermittently for headaches ,but she reports no more than a couple of times per week. She has not been on any recent antibiotics.    Review of Systems  Constitutional: Positive for appetite change.  HENT: Negative.   Eyes: Negative.   Respiratory: Negative.   Cardiovascular: Negative.   Gastrointestinal: Positive for nausea and abdominal pain.  Genitourinary: Negative.   Musculoskeletal: Negative.   Skin: Negative.   Neurological: Negative.   Hematological: Negative.   Psychiatric/Behavioral: Negative.    Outpatient Prescriptions Prior to Visit  Medication Sig Dispense Refill  . omeprazole (PRILOSEC) 40 MG capsule Take 1 capsule (40 mg total) by mouth daily.  30 capsule  1   No facility-administered medications prior to visit.       Allergies  Allergen Reactions  . Morphine    Patient Active Problem List  Diagnoses  . ANXIETY STATE, UNSPECIFIED  . MIGRAINE HEADACHE  . PEPTIC ULCER DISEASE  . SEIZURE DISORDER  . PEDAL EDEMA  .  WEIGHT GAIN    Objective:   Physical Exam Well-developed white female in no acute distress, pleasant, alert oriented x3 Blood pressure 100/80, pulse 88, HEENT ; normocephalic EOMI, PERRLA, sclera, anicteric,Neck; Supple no JVD, Cardiovascular; regular rate and rhythm with S1-S2 no murmur or gallop, Pulmonary; clear bilaterally, Abdomen; soft, tender in the left upper quadrant and epigastrium no guarding no rebound, no palpable mass or hepatosplenomegaly, bowel sounds present, Rectal; not done, extremities ;no clubbing cyanosis or edema; Skin warm and dry. Psych; mood and affect normal and appropriate        Assessment & Plan:  #42 35 year old female with a six-day history of left upper quadrant and epigastric pain ascribed as cramping and burning and associated with nausea. Rule out gastroenteritis. versus gastritis. versus recurrent peptic ulcer disease.  #2 Remote history of perforated ulcer 1980s requiring oversewing  #3 Status post appendectomy and cholecystectomy  Plan; Labs today, CBC ,CMET, amylase and lipase Schedule for CT scan of the abdomen and pelvis today Continue Prilosec 40 mg twice daily x2 weeks and then  decrease to 40 mg once daily  Depending on results of labs and CT, she may need upper endoscopy.

## 2011-02-19 NOTE — Patient Instructions (Addendum)
You have been given a separate informational sheet regarding your tobacco use, the importance of quitting and local resources to help you quit.  Please go to the basement level to have your labs drawn.   We have scheduled the CT scan at [facility].  Directions and contrast given.

## 2011-02-19 NOTE — Progress Notes (Signed)
I AGREE WITH ASSESSMENT AND PLANS...DRP

## 2011-02-22 ENCOUNTER — Telehealth: Payer: Self-pay | Admitting: Internal Medicine

## 2011-02-22 DIAGNOSIS — R1010 Upper abdominal pain, unspecified: Secondary | ICD-10-CM

## 2011-02-22 NOTE — Telephone Encounter (Signed)
Patient saw Mike Gip, PA on 02/19/11 for LUQ abdominal pain. She had a CT abd- negative and labs that were normal. She is calling to report she is continuing to have LUQ pain and nausea off and on. She could not work today. Wants to know if she can have an EGD. Please, advise.

## 2011-02-24 NOTE — Telephone Encounter (Signed)
OK to schedule EGD with me for next week.Lisa Ryan DB

## 2011-02-25 ENCOUNTER — Telehealth: Payer: Self-pay | Admitting: *Deleted

## 2011-02-25 NOTE — Telephone Encounter (Signed)
Spoke with patient and scheduled her EGD on 03/04/11 3:00 PM arrival for 4:00 PM. Pre visit scheduled on 03/01/11 at 8:00 AM.

## 2011-02-26 NOTE — Telephone Encounter (Signed)
Opened in error

## 2011-03-01 ENCOUNTER — Ambulatory Visit (AMBULATORY_SURGERY_CENTER): Payer: 59

## 2011-03-01 VITALS — Ht 67.0 in | Wt 160.0 lb

## 2011-03-01 DIAGNOSIS — R109 Unspecified abdominal pain: Secondary | ICD-10-CM

## 2011-03-04 ENCOUNTER — Encounter: Payer: Self-pay | Admitting: Internal Medicine

## 2011-03-04 ENCOUNTER — Ambulatory Visit (AMBULATORY_SURGERY_CENTER): Payer: 59 | Admitting: Internal Medicine

## 2011-03-04 VITALS — BP 110/82 | HR 91 | Temp 98.8°F | Resp 18 | Ht 67.0 in | Wt 160.0 lb

## 2011-03-04 DIAGNOSIS — K2940 Chronic atrophic gastritis without bleeding: Secondary | ICD-10-CM

## 2011-03-04 DIAGNOSIS — K2970 Gastritis, unspecified, without bleeding: Secondary | ICD-10-CM

## 2011-03-04 DIAGNOSIS — R109 Unspecified abdominal pain: Secondary | ICD-10-CM

## 2011-03-04 MED ORDER — DICYCLOMINE HCL 20 MG PO TABS: 20.0000 mg | ORAL_TABLET | Freq: Three times a day (TID) | ORAL | Status: DC | PRN

## 2011-03-04 MED ORDER — SODIUM CHLORIDE 0.9 % IV SOLN: 500.0000 mL | INTRAVENOUS | Status: DC

## 2011-03-04 NOTE — Progress Notes (Signed)
Around 1638, pt. C/o nausea. Never vomited. Zofran 4mg  IV ordered by Dr. Juanda Chance verbally. Within 5 minutes, patient was relaxed and verbalized relief.

## 2011-03-05 ENCOUNTER — Telehealth: Payer: Self-pay | Admitting: *Deleted

## 2011-03-05 NOTE — Telephone Encounter (Signed)
No answer, no ID on answering machine. No message left. 

## 2011-03-09 ENCOUNTER — Encounter: Payer: Self-pay | Admitting: Internal Medicine

## 2011-03-16 ENCOUNTER — Telehealth: Payer: Self-pay | Admitting: Internal Medicine

## 2011-03-16 NOTE — Telephone Encounter (Signed)
Patient states she has some issues with her FMLA paperwork and needs them fixed.  She will bring by the paperwork and the e-mail from her administrator with the corrections that need to be made.

## 2011-03-18 NOTE — Telephone Encounter (Signed)
Called patient and let her know paper work is completed. Faxed as requested by patient to [phone] Attn Keli Holtmeyer. Originals mailed to patient.

## 2011-03-22 ENCOUNTER — Telehealth: Payer: Self-pay | Admitting: Internal Medicine

## 2011-03-22 NOTE — Telephone Encounter (Signed)
Spoke with patient and she states her company read the FMLA form as she might be out every month with one episode for 2-5 consectitve days. Called and left a message for [facility] at [phone] that Dr. Juanda Chance meant 2-5 days/month. Patient aware that message was left with clarification

## 2011-03-22 NOTE — Telephone Encounter (Signed)
Unable to reach patient.

## 2011-04-03 HISTORY — PX: MOUTH LESION EXCISIONAL BIOPSY: SUR470

## 2011-07-06 ENCOUNTER — Telehealth: Payer: Self-pay | Admitting: Internal Medicine

## 2011-07-06 ENCOUNTER — Encounter: Payer: Self-pay | Admitting: Physician Assistant

## 2011-07-06 ENCOUNTER — Ambulatory Visit (INDEPENDENT_AMBULATORY_CARE_PROVIDER_SITE_OTHER): Payer: 59 | Admitting: Physician Assistant

## 2011-07-06 VITALS — BP 100/60 | HR 64 | Ht 67.5 in | Wt 144.8 lb

## 2011-07-06 DIAGNOSIS — K2970 Gastritis, unspecified, without bleeding: Secondary | ICD-10-CM | POA: Insufficient documentation

## 2011-07-06 DIAGNOSIS — K2990 Gastroduodenitis, unspecified, without bleeding: Secondary | ICD-10-CM

## 2011-07-06 DIAGNOSIS — Z800 Family history of malignant neoplasm of digestive organs: Secondary | ICD-10-CM | POA: Insufficient documentation

## 2011-07-06 MED ORDER — OMEPRAZOLE MAGNESIUM 20 MG PO TBEC: 20.0000 mg | DELAYED_RELEASE_TABLET | Freq: Every day | ORAL | Status: DC

## 2011-07-06 MED ORDER — GLYCOPYRROLATE 2 MG PO TABS: 2.0000 mg | ORAL_TABLET | Freq: Two times a day (BID) | ORAL | Status: DC

## 2011-07-06 MED ORDER — ALIGN 4 MG PO CAPS: 1.0000 | ORAL_CAPSULE | Freq: Every day | ORAL | Status: DC

## 2011-07-06 NOTE — Progress Notes (Signed)
Subjective:    Patient ID: Lisa Ryan, female    DOB: [birth year], 36 y.o.   MRN: [id number]  HPI Uyen is a 36 year old white female known to Dr. Lina Sar who has history of remote perforated ulcer in 1995 for which she underwent exploratory lap. She is status post cholecystectomy. She has history of gastritis and bile reflux. She did undergo upper endoscopy in November of 2012 after she had come in with complaints of upper abdominal pain and cramping in the setting of NSAID use. She was found to have evidence of gastritis and some bile reflux. She was treated with Bentyl twice daily and continued on her Prilosec.  Patient says that the bentyl did  not help and actually caused more abdominal pain. She has  continued on the Prilosec daily and very rarely takes an Advil or Aleve. She says over the past couple of months she has had mild flares of her symptoms which may last for a day or 2 with abdominal discomfort cramping and sometimes loose stools. She says usually she can manage these and may occasionally miss a day of work but over the past week she has had increased symptoms. She says she went out to eat on Sunday evening and ate a prime rib that was covered with a Peppersauce and then a couple of hours later developed more intense upper abdominal pain and what she describes as spasms and cramps. She says is bothered her for several hours at night and then started up again Monday morning. She describes it as a "cutting" type pain. She's had some mild nausea which she thinks is related to the pain no vomiting no reflux symptoms no burning. She has had some loose stools over the past couple of days as well as is usually Schiavone feels better for a little while after a bowel movement and then her symptoms increase again.  Patient has had prior colonoscopy which was done about 10 years ago. Today she mentions that she does have family history of colon cancer in her mother, maternal aunt ,and  maternal grandmother all diagnosed at age 42 or less    Review of Systems  Constitutional: Negative.   HENT: Negative.   Eyes: Negative.   Respiratory: Negative.   Cardiovascular: Negative.   Gastrointestinal: Positive for abdominal pain and diarrhea.  Genitourinary: Negative.   Musculoskeletal: Negative.   Neurological: Negative.   Hematological: Negative.   Psychiatric/Behavioral: Negative.    Outpatient Prescriptions Prior to Visit  Medication Sig Dispense Refill  . naproxen sodium (ANAPROX) 220 MG tablet Take 220 mg by mouth. Aleve as needed      . omeprazole (PRILOSEC) 40 MG capsule Take 1 capsule (40 mg total) by mouth daily.  30 capsule  1  . topiramate (TOPAMAX) 50 MG tablet 50 mg 2 (two) times daily.       Marland Kitchen dicyclomine (BENTYL) 20 MG tablet Take 1 tablet (20 mg total) by mouth 3 (three) times daily as needed.  60 tablet  1   Allergies  Allergen Reactions  . Morphine     hallunations       Patient Active Problem List  Diagnoses  . ANXIETY STATE, UNSPECIFIED  . MIGRAINE HEADACHE  . PEPTIC ULCER DISEASE  . SEIZURE DISORDER  . PEDAL EDEMA  . WEIGHT GAIN  . Gastritis    Objective:   Physical Exam well-developed white female in no acute distress, pleasant. Blood pressure 100/60 pulse 64. HEENT; nontraumatic, normocephalic, EOMI, PERRLA sclera anicteric Neck;,  Supple no JVD, Cardiovascular; regular rate and rhythm with S1-S2 no murmur gallop, Pulmonary; clear bilaterally Abdomen; soft she is tender in the epigastrium and left upper quadrant no guarding no rebound no palpable masses or hepatosplenomegaly bowel sounds are active, Rectal;not done, Extremities; no clubbing cyanosis or edema skin warm dry, Psych; mood and affect normal and appropriate        Assessment & Plan:  #80  36 year old female with history of gastritis and probably underlying IBS with periodic episodes of abdominal cramping and loose stools with exacerbation over the past few days. #2 family hx  of colon cancer  Plan; increase Prilosec to twice daily x2 weeks Avoid NSAIDs Add Align one daily  Will give a trial of Robinul Forte 2 mg by mouth twice daily. Patient needs a followup colonoscopy, given her family history and also to further evaluate her current GI symptoms. Patient and she was traumatized by a remote flexible sigmoidoscopy that she had without sedation and says it is very difficult for her to think about having a colonoscopy. I assured her that she could be deeply sedated with propofol with no chance of being awake during the procedure. She wants to think about this and will call back. I strongly encouraged her to call back and schedule colonoscopy given her significant family history of more than 1 relative with colon cancer.

## 2011-07-06 NOTE — Patient Instructions (Signed)
We sent a prescriptions to [facility] for the [employer]. We have given you samples of Align and Prilosec OTC.   Take the Prilosec OTC twice daily for 2 weeks.  Call us back to schedule the colonoscopy.

## 2011-07-06 NOTE — Telephone Encounter (Signed)
Spoke with patient and she states she has had stomach cramping for 2 days. States the cramping/spasms are below belly button. She is taking Omeprazole daily and Zantac prn. She states she has not taken Bentyl because it made her stomach upset. States she is having some diarrhea. Denies nausea, vomiting, reflux or fever. Last EGD 03/04/11- mild gastritis, bile reflux. Patient states she wants to be seen. Scheduled patient with Mike Gip, PA  Today at 1:30 PM.

## 2011-07-07 NOTE — Progress Notes (Signed)
I agree with assessment and plan.

## 2011-08-07 ENCOUNTER — Other Ambulatory Visit: Payer: Self-pay | Admitting: Internal Medicine

## 2011-08-24 ENCOUNTER — Telehealth: Payer: Self-pay | Admitting: Internal Medicine

## 2011-08-24 NOTE — Telephone Encounter (Signed)
Spoke with patient and

## 2011-08-24 NOTE — Telephone Encounter (Signed)
Spoke with patient and Karen Kitchens has called her and forms are ready for pick up

## 2011-11-01 ENCOUNTER — Other Ambulatory Visit: Payer: Self-pay | Admitting: Internal Medicine

## 2011-11-01 NOTE — Telephone Encounter (Signed)
I have advised patient that I will put a couple samples of Align up front for her. I have asked her again about scheduling colonoscopy (patient was to have this several months ago due to family history of colon cancer). She states that she will have it done sometime after August. I have advised her that we will send her a reminder letter.

## 2011-11-03 ENCOUNTER — Ambulatory Visit (INDEPENDENT_AMBULATORY_CARE_PROVIDER_SITE_OTHER): Payer: 59 | Admitting: Internal Medicine

## 2011-11-03 ENCOUNTER — Encounter: Payer: Self-pay | Admitting: Internal Medicine

## 2011-11-03 VITALS — BP 110/76

## 2011-11-03 DIAGNOSIS — R109 Unspecified abdominal pain: Secondary | ICD-10-CM

## 2011-11-03 DIAGNOSIS — R52 Pain, unspecified: Secondary | ICD-10-CM

## 2011-11-03 LAB — POCT URINALYSIS DIPSTICK
Bilirubin, UA: NEGATIVE
Blood, UA: NEGATIVE
Glucose, UA: NEGATIVE
Nitrite, UA: NEGATIVE

## 2011-11-03 NOTE — Patient Instructions (Signed)
Call or return to clinic prn if these symptoms worsen or fail to improve as anticipated.

## 2011-11-03 NOTE — Progress Notes (Signed)
  Subjective:    Patient ID: Lisa Ryan, female    DOB: 16-Jul-1975, 36 y.o.   MRN: [id number]  HPI  36 year old patient who presents with a 3 to four-day history of right lower cause her pain and diarrhea. She does have a history of IBS. She has had a remote appendectomy approximately 18 years ago. No nausea vomiting. Pain is minor; no urinary dysuria. No history of kidney stones. She did have an abdominal and pelvic CT scan in October that revealed very small left ovarian cysts. Gallbladder and appendix were surgically absent    Review of Systems  Constitutional: Negative.   HENT: Negative for hearing loss, congestion, sore throat, rhinorrhea, dental problem, sinus pressure and tinnitus.   Eyes: Negative for pain, discharge and visual disturbance.  Respiratory: Negative for cough and shortness of breath.   Cardiovascular: Negative for chest pain, palpitations and leg swelling.  Gastrointestinal: Positive for abdominal pain. Negative for nausea, vomiting, diarrhea, constipation, blood in stool and abdominal distention.  Genitourinary: Negative for dysuria, urgency, frequency, hematuria, flank pain, vaginal bleeding, vaginal discharge, difficulty urinating, vaginal pain and pelvic pain.  Musculoskeletal: Negative for joint swelling, arthralgias and gait problem.  Skin: Negative for rash.  Neurological: Negative for dizziness, syncope, speech difficulty, weakness, numbness and headaches.  Hematological: Negative for adenopathy.  Psychiatric/Behavioral: Negative for behavioral problems, dysphoric mood and agitation. The patient is not nervous/anxious.        Objective:   Physical Exam  Constitutional: She is oriented to person, place, and time. She appears well-developed and well-nourished.  HENT:  Head: Normocephalic.  Right Ear: External ear normal.  Left Ear: External ear normal.  Mouth/Throat: Oropharynx is clear and moist.  Eyes: Conjunctivae and EOM are normal. Pupils are  equal, round, and reactive to light.  Neck: Normal range of motion. Neck supple. No thyromegaly present.  Cardiovascular: Normal rate, regular rhythm, normal heart sounds and intact distal pulses.   Pulmonary/Chest: Effort normal and breath sounds normal.  Abdominal: Soft. Bowel sounds are normal. She exhibits no mass. There is tenderness.       Very mild right lower quadrant tenderness to deep palpation. No guarding or rebound. Bowel sounds normal  Musculoskeletal: Normal range of motion.  Lymphadenopathy:    She has no cervical adenopathy.  Neurological: She is alert and oriented to person, place, and time.  Skin: Skin is warm and dry. No rash noted.  Psychiatric: She has a normal mood and affect. Her behavior is normal.          Assessment & Plan:   Nonspecific abdominal pain diarrhea. We'll continue her present medical regimen and simply monitor at this time. She will report any worsening or new symptoms

## 2011-11-19 ENCOUNTER — Other Ambulatory Visit: Payer: Self-pay | Admitting: Physician Assistant

## 2012-03-07 ENCOUNTER — Ambulatory Visit (INDEPENDENT_AMBULATORY_CARE_PROVIDER_SITE_OTHER): Payer: 59 | Admitting: Internal Medicine

## 2012-03-07 ENCOUNTER — Telehealth: Payer: Self-pay | Admitting: Internal Medicine

## 2012-03-07 ENCOUNTER — Ambulatory Visit (INDEPENDENT_AMBULATORY_CARE_PROVIDER_SITE_OTHER)
Admission: RE | Admit: 2012-03-07 | Discharge: 2012-03-07 | Disposition: A | Discharge status: Home / Self Care | Payer: 59 | Source: Ambulatory Visit | Attending: Internal Medicine | Admitting: Internal Medicine

## 2012-03-07 ENCOUNTER — Encounter: Payer: Self-pay | Admitting: Internal Medicine

## 2012-03-07 VITALS — BP 110/72 | Temp 98.2°F | Wt 142.0 lb

## 2012-03-07 DIAGNOSIS — R232 Flushing: Secondary | ICD-10-CM

## 2012-03-07 DIAGNOSIS — R0789 Other chest pain: Secondary | ICD-10-CM | POA: Insufficient documentation

## 2012-03-07 DIAGNOSIS — R079 Chest pain, unspecified: Secondary | ICD-10-CM

## 2012-03-07 LAB — BASIC METABOLIC PANEL
CO2: 22 mEq/L (ref 19–32)
Chloride: 108 mEq/L (ref 96–112)
Potassium: 3.5 mEq/L (ref 3.5–5.1)
Sodium: 138 mEq/L (ref 135–145)

## 2012-03-07 LAB — CBC WITH DIFFERENTIAL/PLATELET
Basophils Relative: 0.5 % (ref 0.0–3.0)
Eosinophils Absolute: 0.1 10*3/uL (ref 0.0–0.7)
Eosinophils Relative: 0.8 % (ref 0.0–5.0)
HCT: 39.9 % (ref 36.0–46.0)
Hemoglobin: 13.3 g/dL (ref 12.0–15.0)
MCHC: 33.3 g/dL (ref 30.0–36.0)
MCV: 101 fl — ABNORMAL HIGH (ref 78.0–100.0)
Monocytes Absolute: 0.5 10*3/uL (ref 0.1–1.0)
Neutro Abs: 5.7 10*3/uL (ref 1.4–7.7)
RBC: 3.95 Mil/uL (ref 3.87–5.11)
WBC: 8.3 10*3/uL (ref 4.5–10.5)

## 2012-03-07 LAB — T4, FREE: Free T4: 1.05 ng/dL (ref 0.60–1.60)

## 2012-03-07 NOTE — Assessment & Plan Note (Signed)
36 year old white female complains of severe facial flushing of unclear etiology. We discussed small possibility of carcinoid syndrome. Obtain 24-hour urine collection for 5-HIAA.  Use over-the-counter Zyrtec 10 mg once daily for now.  Also check thyroid studies, LH, and FSH.

## 2012-03-07 NOTE — Telephone Encounter (Signed)
Caller: Cassidy/Patient; Patient Name: Demaris Callander; PCP: Eleonore Chiquito [facility]); Best Callback Phone Number: [phone]; Reason for call: Chest Pain/Chest Discomort; Onset 03/07/12 at approximately 9:00am. Reports mild chest pains to the right chest, flushed hot face, body is cold. Reports that the chest pain is mild and radiates to the right shoulder and neck. Reports these pains are intermittent and do not last longer than a few minutes each time. Triaged per Chest Pain Guideline, disposition: see provider within 24 hours for "One or more occurrences of unexplained pain in shoulders, neck, jaw, in one or both arms, stomach or back lasting more than a few minutes that has not been evaluated by a healthcare provider and has risk factors for cardiovascular disease." Offered appointment with Dr. Amador Cunas at 2:30pm, but patient refused. She requested to be seen as soon as possible by another provider. Appointment scheduled at 1:15pm 03/07/12 with Dr. Artist Pais. Care advice and strict call back parameters given per guideline. Patient verbalized understanding.

## 2012-03-07 NOTE — Patient Instructions (Addendum)
Take zyrtec 10 mg OTC once daily Follow up with your PCP within 1 week We will contact you re: blood test and chest x ray results.

## 2012-03-07 NOTE — Progress Notes (Signed)
Subjective:    Patient ID: Lisa Ryan, female    DOB: Sep 03, 1975, 36 y.o.   MRN: [id number]  HPI  36 year old white female with history of migraine headache and atypical chest pain complains of severe facial flushing. Her symptoms started this morning at work. There is no trigger for her symptoms. She felt like all her face was flushed and hot her body felt cold. She describes "internal heat" sensation. She also complains of mild right upper chest pain described as "indigestion pain". Severity is 3/10. She did not have any associated shortness of breath. No nausea. She has history of intermittent diarrhea. She denies any new medications or supplements. No unusual food intake yesterday.  Chart review performed. On September 2011, patient admitted for atypical chest pain. Echocardiogram noted to be normal with ejection fraction of 60-65%. She later had cardiac cath which showed normal coronary angiography and left ventricular motion. She has history of reflux she has been seen by Dr. Juanda Chance in the past. Her last EGD was 03/04/2011. Mild gastritis was found.  Review of Systems  Negative for shortness of breath,  Negative for epigastric pain  Past Medical History  Diagnosis Date  . Anxiety state, unspecified 10/12/2007  . CHEST PAIN, ATYPICAL 02/18/2010  . CHEST PAIN-PRECORDIAL 02/19/2010  . FATIGUE 09/28/2007  . GASTROENTERITIS 10/03/2008  . MIGRAINE HEADACHE 03/04/2010  . PEDAL EDEMA 02/18/2010  . PEPTIC ULCER DISEASE 09/28/2007  . 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 SEIZURE DISORDER 09/28/2007  . WEIGHT GAIN 09/28/2007  . Tobacco abuse   . GERD (gastroesophageal reflux disease)   . CHF (congestive heart failure)     During Pregnancy    History   Social History  . Marital Status: Divorced    Spouse Name: N/A    Number of Children: 3  . Years of Education: N/A   Occupational History  .  [employer]   Social History Main Topics  . Smoking status:  Current Every Day Smoker -- 1.0 packs/day    Types: Cigarettes  . Smokeless tobacco: Never Used     Comment: Gave pt sheet to quit smoking  . Alcohol Use: No  . Drug Use: No  . Sexually Active: Not on file   Other Topics Concern  . Not on file   Social History Narrative  . No narrative on file    Past Surgical History  Procedure Date  . Cholecystectomy   . Appendectomy   . Ankle surgery     left  . Knee arthroscopy     left  . Cesarean section     x3  . Mouth lesion excisional biopsy 04/2011    pre-cancerous, pallet of mouth    Family History  Problem Relation Age of Onset  . Cerebral aneurysm Mother   . Colon cancer Mother   . Heart disease Father   . Diabetes Brother 1    died age 35  . Liver disease Father   . Kidney disease Maternal Grandmother   . Colon cancer Maternal Aunt   . Colon cancer Maternal Grandmother   . Diabetes Sister     Allergies  Allergen Reactions  . Morphine     hallunations    Current Outpatient Prescriptions on File Prior to Visit  Medication Sig Dispense Refill  . glycopyrrolate (ROBINUL) 2 MG tablet TAKE 1 TABLET BY MOUTH TWICE DAILY  60 tablet  0  . naproxen sodium (ANAPROX) 220 MG tablet Take 220 mg by mouth. Aleve as needed      .  Probiotic Product (ALIGN) 4 MG CAPS Take 1 capsule by mouth daily.  21 capsule  0  . topiramate (TOPAMAX) 50 MG tablet 50 mg 2 (two) times daily.       . [DISCONTINUED] omeprazole (PRILOSEC) 40 MG capsule Take 1 capsule (40 mg total) by mouth daily.  30 capsule  1    BP 110/72  Temp 98.2 F (36.8 C) (Oral)  Wt 142 lb (64.411 kg)       Objective:   Physical Exam  Constitutional: She is oriented to person, place, and time. She appears well-developed and well-nourished.  HENT:  Head: Normocephalic and atraumatic.  Right Ear: External ear normal.  Left Ear: External ear normal.  Mouth/Throat: Oropharynx is clear and moist.       No facial flushing  Eyes: EOM are normal. Pupils are equal,  round, and reactive to light.  Neck: Neck supple. No thyromegaly present.  Cardiovascular: Normal rate, regular rhythm and normal heart sounds.   Pulmonary/Chest: Effort normal and breath sounds normal. She has no wheezes.       No dullness to percussion.  Mild chest wall tenderness right upper chest  Abdominal: Soft. Bowel sounds are normal. She exhibits no mass. There is no tenderness.  Musculoskeletal: She exhibits no edema.  Neurological: She is alert and oriented to person, place, and time. No cranial nerve deficit.  Skin: Skin is warm and dry.  Psychiatric:       Slightly anxious          Assessment & Plan:

## 2012-03-07 NOTE — Assessment & Plan Note (Signed)
Patient has mild unexplained right upper chest pain. Symptoms may be related to referred pain from gastritis. Obtain chest x-ray to rule out pulmonary lesion considering smoking history. EKG showed normal sinus rhythm at 81 beats her minute.

## 2012-03-08 ENCOUNTER — Other Ambulatory Visit: Payer: Self-pay | Admitting: Internal Medicine

## 2012-03-08 DIAGNOSIS — E0590 Thyrotoxicosis, unspecified without thyrotoxic crisis or storm: Secondary | ICD-10-CM

## 2012-03-17 LAB — 5 HIAA, QUANTITATIVE, URINE, 24 HOUR: 5-HIAA, 24 Hr Urine: 3.1 mg/24 h (ref <=6.0)

## 2012-03-22 ENCOUNTER — Ambulatory Visit (INDEPENDENT_AMBULATORY_CARE_PROVIDER_SITE_OTHER): Payer: 59 | Admitting: Internal Medicine

## 2012-03-22 ENCOUNTER — Encounter: Payer: Self-pay | Admitting: Internal Medicine

## 2012-03-22 VITALS — BP 100/62 | HR 76 | Temp 97.7°F | Resp 18 | Wt 140.0 lb

## 2012-03-22 DIAGNOSIS — R232 Flushing: Secondary | ICD-10-CM

## 2012-03-22 DIAGNOSIS — G4700 Insomnia, unspecified: Secondary | ICD-10-CM

## 2012-03-22 DIAGNOSIS — F411 Generalized anxiety disorder: Secondary | ICD-10-CM

## 2012-03-22 DIAGNOSIS — R5381 Other malaise: Secondary | ICD-10-CM

## 2012-03-22 MED ORDER — ZOLPIDEM TARTRATE 10 MG PO TABS: 10.0000 mg | ORAL_TABLET | Freq: Every evening | ORAL | Status: DC | PRN

## 2012-03-22 NOTE — Progress Notes (Signed)
Subjective:    Patient ID: Lisa Ryan, female    DOB: 10/14/1975, 36 y.o.   MRN: [id number]  HPI 36 year old patient who was seen earlier with complaints of flushing and atypical chest pain. Evaluation included thyroid tests in the revealed a suppressed TSH and a normal T4 -free. She is scheduled for thyroid scan and uptake. Today she complains of fatigue and severe insomnia she states that she is exhausted and has not been able to work over the past 7 days. She is asleep and poorly for several weeks. She also describes a weight loss from 175-140 in spite of a good appetite. She states that she will need short-term disability forms completed. She works for [employer]. She complains of some mild situational stress but nothing out of the ordinary  Past Medical History  Diagnosis Date  . Anxiety state, unspecified 10/12/2007  . CHEST PAIN, ATYPICAL 02/18/2010  . CHEST PAIN-PRECORDIAL 02/19/2010  . FATIGUE 09/28/2007  . GASTROENTERITIS 10/03/2008  . MIGRAINE HEADACHE 03/04/2010  . PEDAL EDEMA 02/18/2010  . PEPTIC ULCER DISEASE 09/28/2007  . 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 SEIZURE DISORDER 09/28/2007  . WEIGHT GAIN 09/28/2007  . Tobacco abuse   . GERD (gastroesophageal reflux disease)   . CHF (congestive heart failure)     During Pregnancy    History   Social History  . Marital Status: Divorced    Spouse Name: N/A    Number of Children: 3  . Years of Education: N/A   Occupational History  .  [employer]   Social History Main Topics  . Smoking status: Former Smoker -- 1.0 packs/day for 20 years    Types: Cigarettes    Quit date: 12/03/2011  . Smokeless tobacco: Never Used     Comment: Gave pt sheet to quit smoking  . Alcohol Use: No  . Drug Use: No  . Sexually Active: Not on file   Other Topics Concern  . Not on file   Social History Narrative  . No narrative on file    Past Surgical History  Procedure Date  . Cholecystectomy     . Appendectomy   . Ankle surgery     left  . Knee arthroscopy     left  . Cesarean section     x3  . Mouth lesion excisional biopsy 04/2011    pre-cancerous, pallet of mouth    Family History  Problem Relation Age of Onset  . Cerebral aneurysm Mother   . Colon cancer Mother   . Heart disease Father   . Diabetes Brother 1    died age 72  . Liver disease Father   . Kidney disease Maternal Grandmother   . Colon cancer Maternal Aunt   . Colon cancer Maternal Grandmother   . Diabetes Sister     Allergies  Allergen Reactions  . Morphine     hallunations    Current Outpatient Prescriptions on File Prior to Visit  Medication Sig Dispense Refill  . glycopyrrolate (ROBINUL) 2 MG tablet TAKE 1 TABLET BY MOUTH TWICE DAILY  60 tablet  0  . topiramate (TOPAMAX) 50 MG tablet 50 mg 2 (two) times daily.       Marland Kitchen omeprazole (PRILOSEC) 40 MG capsule Take 40 mg by mouth daily.        BP 100/62  Pulse 76  Temp 97.7 F (36.5 C) (Oral)  Resp 18  Wt 140 lb (63.504 kg)  LMP 03/19/2012  Review of Systems  HENT: Negative for hearing loss, congestion, sore throat, rhinorrhea, dental problem, sinus pressure and tinnitus.   Eyes: Negative for pain, discharge and visual disturbance.  Respiratory: Negative for cough and shortness of breath.   Cardiovascular: Negative for chest pain, palpitations and leg swelling.  Gastrointestinal: Negative for nausea, vomiting, abdominal pain, diarrhea, constipation, blood in stool and abdominal distention.  Genitourinary: Negative for dysuria, urgency, frequency, hematuria, flank pain, vaginal bleeding, vaginal discharge, difficulty urinating, vaginal pain and pelvic pain.  Musculoskeletal: Negative for joint swelling, arthralgias and gait problem.  Skin: Negative for rash.  Neurological: Positive for weakness. Negative for dizziness, syncope, speech difficulty, numbness and headaches.  Hematological: Negative for adenopathy.   Psychiatric/Behavioral: Positive for sleep disturbance and decreased concentration. Negative for behavioral problems, dysphoric mood and agitation. The patient is nervous/anxious.        Objective:   Physical Exam  Constitutional: She is oriented to person, place, and time. She appears well-developed and well-nourished.  HENT:  Head: Normocephalic.  Right Ear: External ear normal.  Left Ear: External ear normal.  Mouth/Throat: Oropharynx is clear and moist.  Eyes: Conjunctivae normal and EOM are normal. Pupils are equal, round, and reactive to light.  Neck: Normal range of motion. Neck supple. No thyromegaly present.       Normal thyroid exam  Cardiovascular: Normal rate, regular rhythm, normal heart sounds and intact distal pulses.   Pulmonary/Chest: Effort normal and breath sounds normal.  Abdominal: Soft. Bowel sounds are normal. She exhibits no mass. There is no tenderness.  Musculoskeletal: Normal range of motion.  Lymphadenopathy:    She has no cervical adenopathy.  Neurological: She is alert and oriented to person, place, and time.  Skin: Skin is warm and dry. No rash noted.  Psychiatric: She has a normal mood and affect. Her behavior is normal.       Mildly anxious no acute distress          Assessment & Plan:  Insomnia. Will let treat aggressively with medications and improve sleep hygiene. We'll recheck in 2 weeks Suppressed TSH. Insomnia and weight loss suggestive of hyperthyroidism but doubt. History of anxiety disorder

## 2012-03-22 NOTE — Patient Instructions (Addendum)

## 2012-03-27 ENCOUNTER — Encounter (HOSPITAL_COMMUNITY): Payer: 59

## 2012-03-28 ENCOUNTER — Encounter (HOSPITAL_COMMUNITY): Payer: 59

## 2012-04-03 ENCOUNTER — Ambulatory Visit (HOSPITAL_COMMUNITY): Payer: 59

## 2012-04-03 ENCOUNTER — Encounter (HOSPITAL_COMMUNITY)
Admission: RE | Admit: 2012-04-03 | Discharge: 2012-04-03 | Disposition: A | Discharge status: Home / Self Care | Payer: 59 | Source: Ambulatory Visit | Attending: Internal Medicine | Admitting: Internal Medicine

## 2012-04-03 ENCOUNTER — Encounter (HOSPITAL_COMMUNITY): Payer: Self-pay

## 2012-04-03 DIAGNOSIS — E0590 Thyrotoxicosis, unspecified without thyrotoxic crisis or storm: Secondary | ICD-10-CM

## 2012-04-03 DIAGNOSIS — R634 Abnormal weight loss: Secondary | ICD-10-CM | POA: Insufficient documentation

## 2012-04-03 MED ORDER — SODIUM IODIDE I 131 CAPSULE
10.0000 | Freq: Once | INTRAVENOUS | Status: AC | PRN
  Administered 2012-04-03: 10 via ORAL

## 2012-04-04 ENCOUNTER — Encounter (HOSPITAL_COMMUNITY): Payer: Self-pay

## 2012-04-04 ENCOUNTER — Other Ambulatory Visit (HOSPITAL_COMMUNITY): Payer: 59

## 2012-04-04 ENCOUNTER — Encounter (HOSPITAL_COMMUNITY)
Admission: RE | Admit: 2012-04-04 | Discharge: 2012-04-04 | Disposition: A | Discharge status: Home / Self Care | Payer: 59 | Source: Ambulatory Visit | Attending: Internal Medicine | Admitting: Internal Medicine

## 2012-04-04 MED ORDER — SODIUM PERTECHNETATE TC 99M INJECTION
10.0000 | Freq: Once | INTRAVENOUS | Status: AC | PRN
  Administered 2012-04-04: 9.5 via INTRAVENOUS

## 2012-04-05 ENCOUNTER — Encounter: Payer: Self-pay | Admitting: Internal Medicine

## 2012-04-05 ENCOUNTER — Other Ambulatory Visit: Payer: Self-pay | Admitting: Internal Medicine

## 2012-04-05 ENCOUNTER — Ambulatory Visit (INDEPENDENT_AMBULATORY_CARE_PROVIDER_SITE_OTHER): Payer: 59 | Admitting: Internal Medicine

## 2012-04-05 VITALS — BP 110/70 | HR 80 | Temp 98.2°F | Resp 18 | Wt 142.0 lb

## 2012-04-05 DIAGNOSIS — R634 Abnormal weight loss: Secondary | ICD-10-CM

## 2012-04-05 DIAGNOSIS — R232 Flushing: Secondary | ICD-10-CM

## 2012-04-05 DIAGNOSIS — E041 Nontoxic single thyroid nodule: Secondary | ICD-10-CM

## 2012-04-05 DIAGNOSIS — F411 Generalized anxiety disorder: Secondary | ICD-10-CM

## 2012-04-05 DIAGNOSIS — G43909 Migraine, unspecified, not intractable, without status migrainosus: Secondary | ICD-10-CM

## 2012-04-05 DIAGNOSIS — R5381 Other malaise: Secondary | ICD-10-CM

## 2012-04-05 MED ORDER — TRAZODONE HCL 100 MG PO TABS: 100.0000 mg | ORAL_TABLET | Freq: Every day | ORAL | Status: DC

## 2012-04-05 NOTE — Progress Notes (Signed)
Subjective:    Patient ID: Lisa Ryan, female    DOB: Dec 02, 1975, 35 y.o.   MRN: [id number]  HPI  36 year old patient who is seen today in followup. She has had a recent thyroid scan and uptake do to a slightly suppressed TSH which was normal.  A small left thyroid nodule cannot be excluded and a thyroid ultrasound was suggested which has been ordered. The patient continues to complain of fatigue and extreme insomnia and general sense of well-being. She was given a trial of Ambien 10 which was not helpful.  Her chief complaint continues to be what she describes as extreme warmth involving primarily the facial area. The face apparently does not become red or warm to touch. There has been some significant weight loss with a peak late in the 170 range. This has been stable over the past month She has a history of migraine headaches and has been on Topamax prophylaxis for a number of years. She is on no new medications. Her periods remain essentially normal but are perhaps slightly heavier than normal. She does have a history of anxiety disorder in the past but she is quite adamant  that  this is not a behavioral health issue. She denies any undue stress and feels her life is in order. She states she is thankful for her job the health of her children; she is engaged and in a happy relationship.   She remains quite concerned about a medical problem especially thyroid disorder. She remains on disability leave and states she is unable to work.   Wt Readings from Last 3 Encounters:  04/05/12 142 lb (64.411 kg)  03/22/12 140 lb (63.504 kg)  03/07/12 142 lb (64.411 kg)    Past Medical History  Diagnosis Date  . Anxiety state, unspecified 10/12/2007  . CHEST PAIN, ATYPICAL 02/18/2010  . CHEST PAIN-PRECORDIAL 02/19/2010  . FATIGUE 09/28/2007  . GASTROENTERITIS 10/03/2008  . MIGRAINE HEADACHE 03/04/2010  . PEDAL EDEMA 02/18/2010  . PEPTIC ULCER DISEASE 09/28/2007  . 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 SEIZURE DISORDER 09/28/2007  . WEIGHT GAIN 09/28/2007  . Tobacco abuse   . GERD (gastroesophageal reflux disease)   . CHF (congestive heart failure)     During Pregnancy    History   Social History  . Marital Status: Divorced    Spouse Name: N/A    Number of Children: 3  . Years of Education: N/A   Occupational History  .  [employer]   Social History Main Topics  . Smoking status: Former Smoker -- 1.0 packs/day for 20 years    Types: Cigarettes    Quit date: 12/03/2011  . Smokeless tobacco: Never Used     Comment: Gave pt sheet to quit smoking  . Alcohol Use: No  . Drug Use: No  . Sexually Active: Not on file   Other Topics Concern  . Not on file   Social History Narrative  . No narrative on file    Past Surgical History  Procedure Date  . Cholecystectomy   . Appendectomy   . Ankle surgery     left  . Knee arthroscopy     left  . Cesarean section     x3  . Mouth lesion excisional biopsy 04/2011    pre-cancerous, pallet of mouth    Family History  Problem Relation Age of Onset  . Cerebral aneurysm Mother   . Colon cancer Mother   . Heart disease Father   .  Diabetes Brother 1    died age 54  . Liver disease Father   . Kidney disease Maternal Grandmother   . Colon cancer Maternal Aunt   . Colon cancer Maternal Grandmother   . Diabetes Sister     Allergies  Allergen Reactions  . Morphine     hallunations    Current Outpatient Prescriptions on File Prior to Visit  Medication Sig Dispense Refill  . glycopyrrolate (ROBINUL) 2 MG tablet TAKE 1 TABLET BY MOUTH TWICE DAILY  60 tablet  0  . omeprazole (PRILOSEC) 40 MG capsule Take 40 mg by mouth daily.      Marland Kitchen topiramate (TOPAMAX) 50 MG tablet 50 mg 2 (two) times daily.       . traZODone (DESYREL) 100 MG tablet Take 1 tablet (100 mg total) by mouth at bedtime.  60 tablet  2  . zolpidem (AMBIEN) 10 MG tablet Take 1 tablet (10 mg total) by mouth at bedtime as  needed for sleep.  15 tablet  1   Current Facility-Administered Medications on File Prior to Visit  Medication Dose Route Frequency Provider Last Rate Last Dose  . [COMPLETED] sodium pertechnetate (26mTc04) injection 10 milli Curie  10 milli Curie Intravenous Once PRN Medication Radiologist, MD   9.5 milli Curie at 04/04/12 1315    BP 110/70  Pulse 80  Temp 98.2 F (36.8 C) (Oral)  Resp 18  Wt 142 lb (64.411 kg)  LMP 03/19/2012     Review of Systems  Constitutional: Positive for activity change, appetite change (appetite is described as poor) and fatigue.  HENT: Negative for hearing loss, congestion, sore throat, rhinorrhea, dental problem, sinus pressure and tinnitus.   Eyes: Negative for pain, discharge and visual disturbance.  Respiratory: Negative for cough and shortness of breath.   Cardiovascular: Negative for chest pain, palpitations and leg swelling.  Gastrointestinal: Negative for nausea, vomiting, abdominal pain, diarrhea, constipation, blood in stool and abdominal distention.  Genitourinary: Negative for dysuria, urgency, frequency, hematuria, flank pain, vaginal bleeding, vaginal discharge, difficulty urinating, vaginal pain and pelvic pain.  Musculoskeletal: Negative for joint swelling, arthralgias and gait problem.  Skin: Negative for rash.  Neurological: Positive for weakness. Negative for dizziness, syncope, speech difficulty, numbness and headaches.  Hematological: Negative for adenopathy.  Psychiatric/Behavioral: Positive for sleep disturbance. Negative for behavioral problems, dysphoric mood and agitation. The patient is not nervous/anxious.        Objective:   Physical Exam  Constitutional: She is oriented to person, place, and time. She appears well-developed and well-nourished.  HENT:  Head: Normocephalic.  Right Ear: External ear normal.  Left Ear: External ear normal.  Mouth/Throat: Oropharynx is clear and moist.  Eyes: Conjunctivae normal and EOM are  normal. Pupils are equal, round, and reactive to light.  Neck: Normal range of motion. Neck supple. No thyromegaly present.       No thyroid abnormality  Cardiovascular: Normal rate, regular rhythm, normal heart sounds and intact distal pulses.   Pulmonary/Chest: Effort normal and breath sounds normal.  Abdominal: Soft. Bowel sounds are normal. She exhibits no mass. There is no tenderness.  Musculoskeletal: Normal range of motion.  Lymphadenopathy:    She has no cervical adenopathy.  Neurological: She is alert and oriented to person, place, and time.       Reflexes normal  No tremor  Skin: Skin is warm and dry. No rash noted.       No rash or hyperpigmentation  Psychiatric: She has a normal mood and  affect. Her behavior is normal.          Assessment & Plan:   Excessive fatigue history of weight loss insomnia. Symptom complex includes a chief complaint of excessive facial warmth.  Laboratory workup has been unremarkable except for a slightly depressed TSH with a normal free T4. Thyroid uptake was normal. A thyroid ultrasound will be performed to exclude a small thyroid nodule. Options were discussed with the patient and she is quite adamant that this does not represent a behavioral health issue. We'll obtain consultation with endocrinology. We'll also check a sedimentation rate and cortisol level for completeness.

## 2012-04-05 NOTE — Patient Instructions (Signed)
Endocrinology consultation as discussed   

## 2012-04-06 DIAGNOSIS — Z0279 Encounter for issue of other medical certificate: Secondary | ICD-10-CM

## 2012-04-11 ENCOUNTER — Ambulatory Visit
Admission: RE | Admit: 2012-04-11 | Discharge: 2012-04-11 | Disposition: A | Discharge status: Home / Self Care | Payer: 59 | Source: Ambulatory Visit | Attending: Internal Medicine | Admitting: Internal Medicine

## 2012-04-11 DIAGNOSIS — E041 Nontoxic single thyroid nodule: Secondary | ICD-10-CM

## 2012-04-18 ENCOUNTER — Encounter: Payer: Self-pay | Admitting: Endocrinology

## 2012-04-18 ENCOUNTER — Ambulatory Visit (INDEPENDENT_AMBULATORY_CARE_PROVIDER_SITE_OTHER): Payer: 59 | Admitting: Endocrinology

## 2012-04-18 VITALS — BP 116/78 | HR 78 | Temp 97.0°F | Wt 142.0 lb

## 2012-04-18 DIAGNOSIS — E079 Disorder of thyroid, unspecified: Secondary | ICD-10-CM | POA: Insufficient documentation

## 2012-04-18 LAB — TSH: TSH: 0.461 u[IU]/mL (ref 0.350–4.500)

## 2012-04-18 NOTE — Progress Notes (Signed)
Subjective:    Patient ID: Lisa Ryan, female    DOB: 1976/03/28, 36 y.o.   MRN: [id number]  HPI Pt states 1 month of moderate flushing of the face, and assoc weight loss.   Past Medical History  Diagnosis Date  . Anxiety state, unspecified 10/12/2007  . CHEST PAIN, ATYPICAL 02/18/2010  . CHEST PAIN-PRECORDIAL 02/19/2010  . FATIGUE 09/28/2007  . GASTROENTERITIS 10/03/2008  . MIGRAINE HEADACHE 03/04/2010  . PEDAL EDEMA 02/18/2010  . PEPTIC ULCER DISEASE 09/28/2007  . 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 SEIZURE DISORDER 09/28/2007  . WEIGHT GAIN 09/28/2007  . Tobacco abuse   . GERD (gastroesophageal reflux disease)   . CHF (congestive heart failure)     During Pregnancy    Past Surgical History  Procedure Date  . Cholecystectomy   . Appendectomy   . Ankle surgery     left  . Knee arthroscopy     left  . Cesarean section     x3  . Mouth lesion excisional biopsy 04/2011    pre-cancerous, pallet of mouth    History   Social History  . Marital Status: Divorced    Spouse Name: N/A    Number of Children: 3  . Years of Education: N/A   Occupational History  .  [employer]   Social History Main Topics  . Smoking status: Former Smoker -- 1.0 packs/day for 20 years    Types: Cigarettes    Quit date: 12/03/2011  . Smokeless tobacco: Never Used     Comment: Gave pt sheet to quit smoking  . Alcohol Use: No  . Drug Use: No  . Sexually Active: Not on file   Other Topics Concern  . Not on file   Social History Narrative  . No narrative on file    Current Outpatient Prescriptions on File Prior to Visit  Medication Sig Dispense Refill  . topiramate (TOPAMAX) 50 MG tablet 50 mg 2 (two) times daily.       Marland Kitchen glycopyrrolate (ROBINUL) 2 MG tablet TAKE 1 TABLET BY MOUTH TWICE DAILY  60 tablet  0  . omeprazole (PRILOSEC) 40 MG capsule Take 40 mg by mouth daily.      . traZODone (DESYREL) 100 MG tablet Take 1 tablet (100 mg total) by mouth at  bedtime.  60 tablet  2  . zolpidem (AMBIEN) 10 MG tablet Take 1 tablet (10 mg total) by mouth at bedtime as needed for sleep.  15 tablet  1    Allergies  Allergen Reactions  . Morphine     hallunations    Family History  Problem Relation Age of Onset  . Cerebral aneurysm Mother   . Colon cancer Mother   . Heart disease Father   . Diabetes Brother 1    died age 63  . Liver disease Father   . Kidney disease Maternal Grandmother   . Colon cancer Maternal Aunt   . Colon cancer Maternal Grandmother   . Diabetes Sister   mother had thyroid cancer, uncertain type  BP 116/78  Pulse 78  Temp 97 F (36.1 C) (Oral)  Wt 142 lb (64.411 kg)  SpO2 98%  LMP 03/19/2012    Review of Systems denies fever, hoarseness, double vision, palpitations, sob, polyuria, excessive diaphoresis, numbness, hypoglycemia, and rhinorrhea.  She has insomnia, anxiety, easy bruising, sight tremor, neck, posterior neck pain, myalgias, diarrhea, difficulty with concentration, and heat intolerance. She says headaches are more often than usual recently.  Objective:   Physical Exam VS: see vs page GEN: no distress HEAD: head: no deformity eyes: no periorbital swelling, no proptosis external nose and ears are normal mouth: no lesion seen NECK: thyroid is not enlarged.  i cannot feel any thyroid nodule. CHEST WALL: no deformity LUNGS:  Clear to auscultation. CV: reg rate and rhythm, no murmur ABD: abdomen is soft, nontender.  no hepatosplenomegaly.  not distended.  no hernia. MUSCULOSKELETAL: muscle bulk and strength are grossly normal.  no obvious joint swelling.  gait is normal and steady.   EXTEMITIES: no deformity.  no ulcer on the feet.  feet are of normal color and temp.  no edema.   PULSES: dorsalis pedis intact bilat.  NEURO:  cn 2-12 grossly intact.   readily moves all 4's.  sensation is intact to touch on the feet.  No tremor. SKIN:  Normal texture and temperature.  No rash or suspicious lesion  is visible.   NODES:  None palpable at the neck.   PSYCH: alert, oriented x3.  Does not appear anxious nor depressed.  Lab Results  Component Value Date   TSH 0.461 04/18/2012      Assessment & Plan:  Mild hyperthyroidism, uncertain etiology, resolved.  In this situation, pts are at risk for recurrence of thyroid dysfunction. Migraine.  This limits interpretation of sxs. Insomnia, not thyroid-related.  i have done FMLA form until 05/01/12.  Pt is advised to see dr Kirtland Bouchard before then, to address sxs. Flushing, uncertain etiology.  She could have 24-hr urines for catecholamines, but pheo is unlikely dx.

## 2012-04-18 NOTE — Patient Instructions (Addendum)
Let's repeat the blood tests today.  We'll contact you with results. If your thyroid is overactive again, i'll prescribe for you a mediation to normalize the thyroid level. Please come back for a follow-up appointment for 1 month. if ever you have fever while taking methimazole, stop it and call us, because of the risk of a rare side-effect Let's also plan to recheck the ultrasound in 6-12 months.

## 2012-04-19 ENCOUNTER — Telehealth: Payer: Self-pay | Admitting: *Deleted

## 2012-04-19 ENCOUNTER — Encounter (HOSPITAL_COMMUNITY): Payer: 59

## 2012-04-19 NOTE — Telephone Encounter (Signed)
FORMS FOR FMLA FAXED TO [employer], [employer]

## 2012-04-20 ENCOUNTER — Encounter (HOSPITAL_COMMUNITY): Payer: 59

## 2012-04-27 ENCOUNTER — Encounter: Payer: Self-pay | Admitting: Endocrinology

## 2012-04-28 ENCOUNTER — Encounter: Payer: Self-pay | Admitting: Internal Medicine

## 2012-05-04 ENCOUNTER — Ambulatory Visit: Payer: 59 | Admitting: Internal Medicine

## 2012-05-05 ENCOUNTER — Encounter: Payer: Self-pay | Admitting: Internal Medicine

## 2012-05-05 ENCOUNTER — Ambulatory Visit: Payer: 59 | Admitting: Internal Medicine

## 2012-05-05 ENCOUNTER — Telehealth: Payer: Self-pay | Admitting: Endocrinology

## 2012-05-05 ENCOUNTER — Ambulatory Visit (INDEPENDENT_AMBULATORY_CARE_PROVIDER_SITE_OTHER): Payer: 59 | Admitting: Internal Medicine

## 2012-05-05 VITALS — BP 102/60 | HR 80 | Temp 97.4°F | Resp 18 | Wt 150.0 lb

## 2012-05-05 DIAGNOSIS — R5381 Other malaise: Secondary | ICD-10-CM

## 2012-05-05 DIAGNOSIS — E079 Disorder of thyroid, unspecified: Secondary | ICD-10-CM

## 2012-05-05 DIAGNOSIS — R5383 Other fatigue: Secondary | ICD-10-CM

## 2012-05-05 DIAGNOSIS — R232 Flushing: Secondary | ICD-10-CM

## 2012-05-05 DIAGNOSIS — G43909 Migraine, unspecified, not intractable, without status migrainosus: Secondary | ICD-10-CM

## 2012-05-05 NOTE — Telephone Encounter (Signed)
Huntley Dec from [employer] (fmla/short term disability) called to inquire why patient was written out of work through 05/01/12.  Huntley Dec from [employer] has reviewed the patient's office note and she needs clarification on why the patient was given out of work extension through 05/01/12. Please call Huntley Dec at [phone].

## 2012-05-05 NOTE — Patient Instructions (Signed)
It is important that you exercise regularly, at least 20 minutes 3 to 4 times per week.  If you develop chest pain or shortness of breath seek  medical attention.  Return in 3 months for follow-up  
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
## 2012-05-08 NOTE — Telephone Encounter (Signed)
The previous absence was to give time for pt to make this appt.  The 05/01/12 was to give pt time to get back to dr Amador Cunas.

## 2012-05-23 ENCOUNTER — Ambulatory Visit: Payer: 59 | Admitting: Endocrinology

## 2012-05-23 DIAGNOSIS — Z0289 Encounter for other administrative examinations: Secondary | ICD-10-CM

## 2012-06-17 ENCOUNTER — Other Ambulatory Visit: Payer: Self-pay

## 2012-08-03 ENCOUNTER — Ambulatory Visit: Payer: 59 | Admitting: Internal Medicine

## 2012-08-03 DIAGNOSIS — Z0289 Encounter for other administrative examinations: Secondary | ICD-10-CM

## 2012-08-10 ENCOUNTER — Encounter: Payer: Self-pay | Admitting: Internal Medicine

## 2012-10-22 ENCOUNTER — Other Ambulatory Visit: Payer: Self-pay | Admitting: Diagnostic Neuroimaging

## 2012-11-09 ENCOUNTER — Telehealth: Payer: Self-pay | Admitting: Internal Medicine

## 2012-11-09 MED ORDER — LORAZEPAM 0.5 MG PO TABS: 0.5000 mg | ORAL_TABLET | Freq: Two times a day (BID) | ORAL | Status: DC | PRN

## 2012-11-09 NOTE — Telephone Encounter (Signed)
Verdis  Call follows Recent office visit, anxiety is worse, using the prescription for sleep; wants prescription fro anxiety  called in to [facility].  Please call.

## 2012-11-09 NOTE — Telephone Encounter (Signed)
Lorazepam 0.5 #60 one twice a day when necessary 

## 2012-11-09 NOTE — Telephone Encounter (Signed)
Spoke to pt told her Rx for Lorazepam was called into pharmacy for her. Pt verbalized understanding.

## 2012-11-21 ENCOUNTER — Other Ambulatory Visit: Payer: Self-pay | Admitting: Orthopedic Surgery

## 2012-11-21 DIAGNOSIS — M25562 Pain in left knee: Secondary | ICD-10-CM

## 2012-11-25 ENCOUNTER — Other Ambulatory Visit: Payer: 59

## 2012-11-25 ENCOUNTER — Ambulatory Visit
Admission: RE | Admit: 2012-11-25 | Discharge: 2012-11-25 | Disposition: A | Discharge status: Home / Self Care | Payer: 59 | Source: Ambulatory Visit | Attending: Orthopedic Surgery | Admitting: Orthopedic Surgery

## 2012-11-25 DIAGNOSIS — M25562 Pain in left knee: Secondary | ICD-10-CM

## 2012-12-03 ENCOUNTER — Other Ambulatory Visit: Payer: Self-pay | Admitting: Internal Medicine

## 2012-12-29 ENCOUNTER — Other Ambulatory Visit: Payer: Self-pay | Admitting: Internal Medicine

## 2013-01-15 ENCOUNTER — Ambulatory Visit: Payer: 59 | Admitting: Internal Medicine

## 2013-01-15 ENCOUNTER — Telehealth: Payer: Self-pay | Admitting: Internal Medicine

## 2013-01-15 ENCOUNTER — Ambulatory Visit: Payer: Self-pay | Admitting: Internal Medicine

## 2013-01-15 NOTE — Telephone Encounter (Signed)
Patient Information:  Caller Name: Freeda  Phone: [phone]  Patient: Lisa Ryan, Lisa Ryan  Gender: Female  DOB: 03/30/1976  Age: 37 Years  PCP: Eleonore Chiquito (Family Practice > 86yrs old)  Pregnant: No  Office Follow Up:  Does the office need to follow up with this patient?: No  Instructions For The Office: N/A  RN Note:  Needs at least 1 hour to get to office.  No appointments remain within 4 hours at [facility] office so scheduled for 1015 on 01/15/13 with Dr Yetta Barre at [place] office.   Symptoms  Reason For Call & Symptoms: Productive cough with green/yellow phlem.  Reviewed Health History In EMR: Yes  Reviewed Medications In EMR: Yes  Reviewed Allergies In EMR: Yes  Reviewed Surgeries / Procedures: Yes  Date of Onset of Symptoms: 01/08/2013  Treatments Tried: Dayquil, Nyquil, Zyrtec, Benadryl  Treatments Tried Worked: No OB / GYN:  LMP: 01/01/2013  Guideline(s) Used:  Cough  Disposition Per Guideline:   Go to Office Now  Reason For Disposition Reached:   Wheezing is present  Advice Given:  Reassurance  Coughing is the way that our lungs remove irritants and mucus. It helps protect our lungs from getting pneumonia.  Cough Medicines:  Home Remedy - Honey: This old home remedy has been shown to help decrease coughing at night. The adult dosage is 2 teaspoons (10 ml) at bedtime. Honey should not be given to infants under one year of age.  Coughing Spasms:  Drink warm fluids. Inhale warm mist (Reason: both relax the airway and loosen up the phlegm).  Suck on cough drops or hard candy to coat the irritated throat.  Prevent Dehydration:  Drink adequate liquids.  This will help soothe an irritated or dry throat and loosen up the phlegm.  Call Back If:  Difficulty breathing  Cough lasts more than 3 weeks  Fever lasts > 3 days  You become worse.  Patient Will Follow Care Advice:  YES

## 2013-03-08 ENCOUNTER — Other Ambulatory Visit: Payer: Self-pay

## 2013-04-05 ENCOUNTER — Ambulatory Visit
Admission: RE | Admit: 2013-04-05 | Discharge: 2013-04-05 | Disposition: A | Discharge status: Home / Self Care | Payer: BC Managed Care – PPO | Source: Ambulatory Visit | Attending: Chiropractic Medicine | Admitting: Chiropractic Medicine

## 2013-04-05 ENCOUNTER — Other Ambulatory Visit: Payer: Self-pay | Admitting: Chiropractic Medicine

## 2013-04-05 DIAGNOSIS — S39012A Strain of muscle, fascia and tendon of lower back, initial encounter: Secondary | ICD-10-CM

## 2013-05-03 HISTORY — PX: CARDIAC CATHETERIZATION: SHX172

## 2013-07-07 ENCOUNTER — Other Ambulatory Visit: Payer: Self-pay | Admitting: Internal Medicine

## 2013-07-08 ENCOUNTER — Emergency Department (HOSPITAL_COMMUNITY): Payer: BC Managed Care – PPO

## 2013-07-08 ENCOUNTER — Encounter (HOSPITAL_COMMUNITY): Payer: Self-pay | Admitting: Emergency Medicine

## 2013-07-08 ENCOUNTER — Emergency Department (HOSPITAL_COMMUNITY)
Admission: EM | Admit: 2013-07-08 | Discharge: 2013-07-08 | Disposition: A | Discharge status: Home / Self Care | Payer: BC Managed Care – PPO | Attending: Emergency Medicine | Admitting: Emergency Medicine

## 2013-07-08 DIAGNOSIS — J9801 Acute bronchospasm: Secondary | ICD-10-CM | POA: Insufficient documentation

## 2013-07-08 DIAGNOSIS — Z87891 Personal history of nicotine dependence: Secondary | ICD-10-CM | POA: Insufficient documentation

## 2013-07-08 DIAGNOSIS — K219 Gastro-esophageal reflux disease without esophagitis: Secondary | ICD-10-CM | POA: Insufficient documentation

## 2013-07-08 DIAGNOSIS — G40909 Epilepsy, unspecified, not intractable, without status epilepticus: Secondary | ICD-10-CM | POA: Insufficient documentation

## 2013-07-08 DIAGNOSIS — R0789 Other chest pain: Secondary | ICD-10-CM | POA: Insufficient documentation

## 2013-07-08 DIAGNOSIS — I509 Heart failure, unspecified: Secondary | ICD-10-CM | POA: Insufficient documentation

## 2013-07-08 DIAGNOSIS — Z8711 Personal history of peptic ulcer disease: Secondary | ICD-10-CM | POA: Insufficient documentation

## 2013-07-08 DIAGNOSIS — Z79899 Other long term (current) drug therapy: Secondary | ICD-10-CM | POA: Insufficient documentation

## 2013-07-08 DIAGNOSIS — G43909 Migraine, unspecified, not intractable, without status migrainosus: Secondary | ICD-10-CM | POA: Insufficient documentation

## 2013-07-08 DIAGNOSIS — F411 Generalized anxiety disorder: Secondary | ICD-10-CM | POA: Insufficient documentation

## 2013-07-08 DIAGNOSIS — R079 Chest pain, unspecified: Secondary | ICD-10-CM

## 2013-07-08 LAB — COMPREHENSIVE METABOLIC PANEL
ALT: 20 U/L (ref 0–35)
AST: 16 U/L (ref 0–37)
Albumin: 3.6 g/dL (ref 3.5–5.2)
Alkaline Phosphatase: 80 U/L (ref 39–117)
BUN: 12 mg/dL (ref 6–23)
CALCIUM: 9.2 mg/dL (ref 8.4–10.5)
CO2: 23 meq/L (ref 19–32)
Chloride: 100 mEq/L (ref 96–112)
Creatinine, Ser: 0.63 mg/dL (ref 0.50–1.10)
GFR calc Af Amer: >90 mL/min (ref >90)
GFR calc non Af Amer: >90 mL/min (ref >90)
Glucose, Bld: 93 mg/dL (ref 70–99)
Potassium: 3.9 mEq/L (ref 3.7–5.3)
SODIUM: 135 meq/L — AB (ref 137–147)
TOTAL PROTEIN: 6.4 g/dL (ref 6.0–8.3)
Total Bilirubin: <0.2 mg/dL — ABNORMAL LOW (ref 0.3–1.2)

## 2013-07-08 LAB — CBC WITH DIFFERENTIAL/PLATELET
BASOS ABS: 0.1 10*3/uL (ref 0.0–0.1)
Basophils Relative: 1 % (ref 0–1)
EOS ABS: 0.4 10*3/uL (ref 0.0–0.7)
EOS PCT: 4 % (ref 0–5)
HCT: 37.9 % (ref 36.0–46.0)
Hemoglobin: 13.2 g/dL (ref 12.0–15.0)
LYMPHS PCT: 29 % (ref 12–46)
Lymphs Abs: 3 10*3/uL (ref 0.7–4.0)
MCH: 33.2 pg (ref 26.0–34.0)
MCHC: 34.8 g/dL (ref 30.0–36.0)
MCV: 95.2 fL (ref 78.0–100.0)
Monocytes Absolute: 0.8 10*3/uL (ref 0.1–1.0)
Monocytes Relative: 7 % (ref 3–12)
Neutro Abs: 6.2 10*3/uL (ref 1.7–7.7)
Neutrophils Relative %: 59 % (ref 43–77)
PLATELETS: 297 10*3/uL (ref 150–400)
RBC: 3.98 MIL/uL (ref 3.87–5.11)
RDW: 12.6 % (ref 11.5–15.5)
WBC: 10.5 10*3/uL (ref 4.0–10.5)

## 2013-07-08 LAB — TROPONIN I: Troponin I: <0.3 ng/mL (ref <0.30)

## 2013-07-08 MED ORDER — ALBUTEROL SULFATE (2.5 MG/3ML) 0.083% IN NEBU
5.0000 mg | INHALATION_SOLUTION | Freq: Once | RESPIRATORY_TRACT | Status: AC
  Administered 2013-07-08: 5 mg via RESPIRATORY_TRACT
  Filled 2013-07-08: qty 6

## 2013-07-08 MED ORDER — IPRATROPIUM BROMIDE 0.02 % IN SOLN
0.5000 mg | Freq: Once | RESPIRATORY_TRACT | Status: AC
  Administered 2013-07-08: 0.5 mg via RESPIRATORY_TRACT
  Filled 2013-07-08: qty 2.5

## 2013-07-08 MED ORDER — PREDNISONE 20 MG PO TABS
60.0000 mg | ORAL_TABLET | Freq: Once | ORAL | Status: AC
  Administered 2013-07-08: 60 mg via ORAL
  Filled 2013-07-08: qty 3

## 2013-07-08 MED ORDER — ALBUTEROL SULFATE HFA 108 (90 BASE) MCG/ACT IN AERS
2.0000 | INHALATION_SPRAY | Freq: Once | RESPIRATORY_TRACT | Status: AC
  Administered 2013-07-08: 2 via RESPIRATORY_TRACT
  Filled 2013-07-08: qty 6.7

## 2013-07-08 MED ORDER — PREDNISONE 20 MG PO TABS: ORAL_TABLET | ORAL | Status: DC

## 2013-07-08 NOTE — Discharge Instructions (Signed)
Bronchospasm, Adult A bronchospasm is a spasm or tightening of the airways going into the lungs. During a bronchospasm breathing becomes more difficult because the airways get smaller. When this happens there can be coughing, a whistling sound when breathing (wheezing), and difficulty breathing. Bronchospasm is often associated with asthma, but not all patients who experience a bronchospasm have asthma. CAUSES  A bronchospasm is caused by inflammation or irritation of the airways. The inflammation or irritation may be triggered by:   Allergies (such as to animals, pollen, food, or mold). Allergens that cause bronchospasm may cause wheezing immediately after exposure or many hours later.   Infection. Viral infections are believed to be the most common cause of bronchospasm.   Exercise.   Irritants (such as pollution, cigarette smoke, strong odors, aerosol sprays, and paint fumes).   Weather changes. Winds increase molds and pollens in the air. Rain refreshes the air by washing irritants out. Cold air may cause inflammation.   Stress and emotional upset.  SIGNS AND SYMPTOMS   Wheezing.   Excessive nighttime coughing.   Frequent or severe coughing with a simple cold.   Chest tightness.   Shortness of breath.  DIAGNOSIS  Bronchospasm is usually diagnosed through a history and physical exam. Tests, such as chest X-rays, are sometimes done to look for other conditions. TREATMENT   Inhaled medicines can be given to open up your airways and help you breathe. The medicines can be given using either an inhaler or a nebulizer machine.  Corticosteroid medicines may be given for severe bronchospasm, usually when it is associated with asthma. HOME CARE INSTRUCTIONS   Always have a plan prepared for seeking medical care. Know when to call your health care provider and local emergency services (911 in the [place] Know where you can access local emergency care.  Only take medicines as  directed by your health care provider.  If you were prescribed an inhaler or nebulizer machine, ask your health care provider to explain how to use it correctly. Always use a spacer with your inhaler if you were given one.  It is necessary to remain calm during an attack. Try to relax and breathe more slowly.  Control your home environment in the following ways:   Change your heating and air conditioning filter at least once a month.   Limit your use of fireplaces and wood stoves.  Do not smoke and do not allow smoking in your home.   Avoid exposure to perfumes and fragrances.   Get rid of pests (such as roaches and mice) and their droppings.   Throw away plants if you see mold on them.   Keep your house clean and dust free.   Replace carpet with wood, tile, or vinyl flooring. Carpet can trap dander and dust.   Use allergy-proof pillows, mattress covers, and box spring covers.   Wash bed sheets and blankets every week in hot water and dry them in a dryer.   Use blankets that are made of polyester or cotton.   Wash hands frequently. SEEK MEDICAL CARE IF:   You have muscle aches.   You have chest pain.   The sputum changes from clear or white to yellow, green, gray, or bloody.   The sputum you cough up gets thicker.   There are problems that may be related to the medicine you are given, such as a rash, itching, swelling, or trouble breathing.  SEEK IMMEDIATE MEDICAL CARE IF:   You have worsening wheezing and coughing  even after taking your prescribed medicines.   You have increased difficulty breathing.   You develop severe chest pain. MAKE SURE YOU:   Understand these instructions.  Will watch your condition.  Will get help right away if you are not doing well or get worse. Document Released: 04/22/2003 Document Revised: 12/20/2012 Document Reviewed: 10/09/2012 Exit[employer] Patient Information 2014 [employer].  

## 2013-07-08 NOTE — ED Notes (Signed)
Patient is alert and orientedx4.  Patient was explained discharge instructions and they understood them with no questions.   

## 2013-07-08 NOTE — ED Provider Notes (Signed)
CSN: [id number]     Arrival date & time 07/08/13  0106 History   First MD Initiated Contact with Patient 07/08/13 0448     Chief Complaint  Patient presents with  . Chest Pain     (Consider location/radiation/quality/duration/timing/severity/associated sxs/prior Treatment) HPI Patient is a 38 yo woman with a history of atypical chest pain, approx 30 pack year smoking history, history of peripartum cardiomyopathy.   She presents after developing chest tightness with non productive cough, wheezing and SOB. This began just prior to her arrival to the ED. Sx started while she was watching TV. Patient says she had significant SOB when she arrived to the ED via POV.  She was treated with an Albuterol SVN which, she says, completely resolved her sx.   No recent fever. Chest discomfort resolved with Albuterol SVN. Patient reports that she has no history of CAD and had a cardiac cath performed in 2013 which was negative for CAD. She was a 2 ppd smoker until she quit 2 years ago.   Past Medical History  Diagnosis Date  . Anxiety state, unspecified 10/12/2007  . CHEST PAIN, ATYPICAL 02/18/2010  . CHEST PAIN-PRECORDIAL 02/19/2010  . FATIGUE 09/28/2007  . GASTROENTERITIS 10/03/2008  . MIGRAINE HEADACHE 03/04/2010  . PEDAL EDEMA 02/18/2010  . PEPTIC ULCER DISEASE 09/28/2007  . 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 SEIZURE DISORDER 09/28/2007  . WEIGHT GAIN 09/28/2007  . Tobacco abuse   . GERD (gastroesophageal reflux disease)   . CHF (congestive heart failure)     During Pregnancy   Past Surgical History  Procedure Laterality Date  . Cholecystectomy    . Appendectomy    . Ankle surgery      left  . Knee arthroscopy      left  . Cesarean section      x3  . Mouth lesion excisional biopsy  04/2011    pre-cancerous, pallet of mouth   Family History  Problem Relation Age of Onset  . Cerebral aneurysm Mother   . Colon cancer Mother   . Heart disease Father   . Diabetes  Brother 1    died age 75  . Liver disease Father   . Kidney disease Maternal Grandmother   . Colon cancer Maternal Aunt   . Colon cancer Maternal Grandmother   . Diabetes Sister    History  Substance Use Topics  . Smoking status: Former Smoker -- 1.00 packs/day for 20 years    Types: Cigarettes    Quit date: 12/03/2011  . Smokeless tobacco: Never Used     Comment: Gave pt sheet to quit smoking  . Alcohol Use: No   OB History   Grav Para Term Preterm Abortions TAB SAB Ect Mult Living                 Review of Systems Ten point review of symptoms performed and is negative with the exception of symptoms noted above.     Allergies  Morphine  Home Medications   Current Outpatient Rx  Name  Route  Sig  Dispense  Refill  . glycopyrrolate (ROBINUL) 2 MG tablet      TAKE 1 TABLET BY MOUTH TWICE DAILY   60 tablet   0   . LORazepam (ATIVAN) 0.5 MG tablet      TAKE 1 TABLET BY MOUTH TWICE A DAY AS NEEDED FOR ANXIETY   60 tablet   0   . EXPIRED: omeprazole (PRILOSEC) 40 MG capsule   Oral  Take 40 mg by mouth daily.         Marland Kitchen topiramate (TOPAMAX) 50 MG tablet      TAKE 1 TABLET BY MOUTH EVERY MORNING AND 2 TABLETS BY MOUTH EVERY EVENING   90 tablet   0     PLEASE SCHEDULE APPT   . traZODone (DESYREL) 100 MG tablet   Oral   Take 1 tablet (100 mg total) by mouth at bedtime.   60 tablet   2    BP 106/60  Pulse 76  Temp(Src) 97.5 F (36.4 C) (Oral)  Resp 23  Ht 5\' 7"  (1.702 m)  Wt 170 lb (77.111 kg)  BMI 26.62 kg/m2  SpO2 100%  LMP 06/13/2013 Physical Exam Gen: well developed and well nourished appearing Head: NCAT Eyes: PERL, EOMI Nose: no epistaixis or rhinorrhea Mouth/throat: mucosa is moist and pink Neck: supple, no stridor Lungs: CTA B, no wheezing, rhonchi or rales CV: RRR, no murmur, extremities appear well perfused.  Abd: soft, notender, nondistended Back: no ttp, no cva ttp Skin: warm and dry Ext: normal to inspection, no dependent  edema Neuro: CN ii-xii grossly intact, no focal deficits Psyche; normal affect,  calm and cooperative.   ED Course  Procedures (including critical care time) Labs Review  Results for orders placed during the hospital encounter of 07/08/13 (from the past 24 hour(s))  CBC WITH DIFFERENTIAL     Status: None   Collection Time    07/08/13  1:21 AM      Result Value Ref Range   WBC 10.5  4.0 - 10.5 K/uL   RBC 3.98  3.87 - 5.11 MIL/uL   Hemoglobin 13.2  12.0 - 15.0 g/dL   HCT 26.7  12.4 - 58.0 %   MCV 95.2  78.0 - 100.0 fL   MCH 33.2  26.0 - 34.0 pg   MCHC 34.8  30.0 - 36.0 g/dL   RDW 99.8  33.8 - 25.0 %   Platelets 297  150 - 400 K/uL   Neutrophils Relative % 59  43 - 77 %   Neutro Abs 6.2  1.7 - 7.7 K/uL   Lymphocytes Relative 29  12 - 46 %   Lymphs Abs 3.0  0.7 - 4.0 K/uL   Monocytes Relative 7  3 - 12 %   Monocytes Absolute 0.8  0.1 - 1.0 K/uL   Eosinophils Relative 4  0 - 5 %   Eosinophils Absolute 0.4  0.0 - 0.7 K/uL   Basophils Relative 1  0 - 1 %   Basophils Absolute 0.1  0.0 - 0.1 K/uL  COMPREHENSIVE METABOLIC PANEL     Status: Abnormal   Collection Time    07/08/13  1:21 AM      Result Value Ref Range   Sodium 135 (*) 137 - 147 mEq/L   Potassium 3.9  3.7 - 5.3 mEq/L   Chloride 100  96 - 112 mEq/L   CO2 23  19 - 32 mEq/L   Glucose, Bld 93  70 - 99 mg/dL   BUN 12  6 - 23 mg/dL   Creatinine, Ser 5.39  0.50 - 1.10 mg/dL   Calcium 9.2  8.4 - 76.7 mg/dL   Total Protein 6.4  6.0 - 8.3 g/dL   Albumin 3.6  3.5 - 5.2 g/dL   AST 16  0 - 37 U/L   ALT 20  0 - 35 U/L   Alkaline Phosphatase 80  39 - 117 U/L   Total  Bilirubin <0.2 (*) 0.3 - 1.2 mg/dL   GFR calc non Af Amer >90  >90 mL/min   GFR calc Af Amer >90  >90 mL/min  TROPONIN I     Status: None   Collection Time    07/08/13  1:21 AM      Result Value Ref Range   Troponin I <0.30  <0.30 ng/mL     Imaging Review Dg Chest 2 View  07/08/2013   CLINICAL DATA:  Chest pain  EXAM: CHEST  2 VIEW  COMPARISON:  Prior  radiograph from 03/07/2012  FINDINGS: The cardiac and mediastinal silhouettes are stable in size and contour, and remain within normal limits.  The lungs are normally inflated. No airspace consolidation, pleural effusion, or pulmonary edema is identified. There is no pneumothorax.  No acute osseous abnormality identified.  IMPRESSION: No active cardiopulmonary disease.   Electronically Signed   By: Jeannine Boga M.D.   On: 07/08/2013 03:59    EKG: nsr, no acute ischemic changes, normal intervals, normal axis, normal qrs complex  MDM   DDX: pneumonia, bronchospasm, acs, pleural effusion, ptx.    Patient's sx resolved after single Albuterol neb and she has remained asymptomatic. Her ED work up is non-diagnostic. She is feeling better and wants to go home. We will tx with prednisone prior to discharge.     Elyn Peers, MD 07/08/13 [phone]

## 2013-07-08 NOTE — ED Notes (Signed)
The pt is c/o chest tightness with wheezing.  She has had pneumonia oct 2014.  Her symptoms started around 2-3 hours ago

## 2013-07-19 ENCOUNTER — Telehealth: Payer: Self-pay | Admitting: Internal Medicine

## 2013-07-19 NOTE — Telephone Encounter (Signed)
Pt would like to switch to dr Regis Bill. Pt stated she prefer a female

## 2013-07-19 NOTE — Telephone Encounter (Signed)
ok 

## 2013-08-02 NOTE — Telephone Encounter (Signed)
Ok to change

## 2013-08-02 NOTE — Telephone Encounter (Signed)
lmom for pt to sch appt °

## 2013-08-07 NOTE — Telephone Encounter (Signed)
lmom for pt to sch appt with dr Regis Bill

## 2013-10-05 ENCOUNTER — Encounter (HOSPITAL_COMMUNITY): Payer: Self-pay | Admitting: Emergency Medicine

## 2013-10-05 ENCOUNTER — Inpatient Hospital Stay (HOSPITAL_COMMUNITY)
Admission: EM | Admit: 2013-10-05 | Discharge: 2013-10-09 | DRG: 192 | Disposition: A | Discharge status: Home / Self Care | Payer: Commercial Managed Care - PPO | Attending: Internal Medicine | Admitting: Internal Medicine

## 2013-10-05 ENCOUNTER — Emergency Department (HOSPITAL_COMMUNITY): Payer: Commercial Managed Care - PPO

## 2013-10-05 DIAGNOSIS — R609 Edema, unspecified: Secondary | ICD-10-CM

## 2013-10-05 DIAGNOSIS — J45909 Unspecified asthma, uncomplicated: Secondary | ICD-10-CM

## 2013-10-05 DIAGNOSIS — K279 Peptic ulcer, site unspecified, unspecified as acute or chronic, without hemorrhage or perforation: Secondary | ICD-10-CM

## 2013-10-05 DIAGNOSIS — K2970 Gastritis, unspecified, without bleeding: Secondary | ICD-10-CM

## 2013-10-05 DIAGNOSIS — E079 Disorder of thyroid, unspecified: Secondary | ICD-10-CM

## 2013-10-05 DIAGNOSIS — R05 Cough: Secondary | ICD-10-CM

## 2013-10-05 DIAGNOSIS — R059 Cough, unspecified: Secondary | ICD-10-CM

## 2013-10-05 DIAGNOSIS — Z800 Family history of malignant neoplasm of digestive organs: Secondary | ICD-10-CM

## 2013-10-05 DIAGNOSIS — R0789 Other chest pain: Secondary | ICD-10-CM

## 2013-10-05 DIAGNOSIS — K219 Gastro-esophageal reflux disease without esophagitis: Secondary | ICD-10-CM | POA: Diagnosis present

## 2013-10-05 DIAGNOSIS — R569 Unspecified convulsions: Secondary | ICD-10-CM | POA: Diagnosis present

## 2013-10-05 DIAGNOSIS — J40 Bronchitis, not specified as acute or chronic: Secondary | ICD-10-CM

## 2013-10-05 DIAGNOSIS — R9431 Abnormal electrocardiogram [ECG] [EKG]: Secondary | ICD-10-CM

## 2013-10-05 DIAGNOSIS — Z87891 Personal history of nicotine dependence: Secondary | ICD-10-CM

## 2013-10-05 DIAGNOSIS — J441 Chronic obstructive pulmonary disease with (acute) exacerbation: Principal | ICD-10-CM | POA: Diagnosis present

## 2013-10-05 DIAGNOSIS — R000 Tachycardia, unspecified: Secondary | ICD-10-CM

## 2013-10-05 DIAGNOSIS — Z79899 Other long term (current) drug therapy: Secondary | ICD-10-CM

## 2013-10-05 DIAGNOSIS — R635 Abnormal weight gain: Secondary | ICD-10-CM

## 2013-10-05 DIAGNOSIS — R232 Flushing: Secondary | ICD-10-CM

## 2013-10-05 DIAGNOSIS — J45901 Unspecified asthma with (acute) exacerbation: Principal | ICD-10-CM

## 2013-10-05 DIAGNOSIS — F411 Generalized anxiety disorder: Secondary | ICD-10-CM | POA: Diagnosis present

## 2013-10-05 DIAGNOSIS — G43909 Migraine, unspecified, not intractable, without status migrainosus: Secondary | ICD-10-CM

## 2013-10-05 LAB — CBC WITH DIFFERENTIAL/PLATELET
Basophils Absolute: 0 10*3/uL (ref 0.0–0.1)
Basophils Relative: 0 % (ref 0–1)
Eosinophils Absolute: 0 10*3/uL (ref 0.0–0.7)
Eosinophils Relative: 0 % (ref 0–5)
HEMATOCRIT: 40.6 % (ref 36.0–46.0)
Hemoglobin: 13.6 g/dL (ref 12.0–15.0)
LYMPHS PCT: 11 % — AB (ref 12–46)
Lymphs Abs: 0.8 10*3/uL (ref 0.7–4.0)
MCH: 32.5 pg (ref 26.0–34.0)
MCHC: 33.5 g/dL (ref 30.0–36.0)
MCV: 96.9 fL (ref 78.0–100.0)
MONO ABS: 0.2 10*3/uL (ref 0.1–1.0)
Monocytes Relative: 3 % (ref 3–12)
NEUTROS PCT: 86 % — AB (ref 43–77)
Neutro Abs: 5.9 10*3/uL (ref 1.7–7.7)
Platelets: 275 10*3/uL (ref 150–400)
RBC: 4.19 MIL/uL (ref 3.87–5.11)
RDW: 12.8 % (ref 11.5–15.5)
WBC: 6.8 10*3/uL (ref 4.0–10.5)

## 2013-10-05 LAB — BASIC METABOLIC PANEL
BUN: 13 mg/dL (ref 6–23)
CO2: 17 meq/L — AB (ref 19–32)
CREATININE: 0.58 mg/dL (ref 0.50–1.10)
Calcium: 9.7 mg/dL (ref 8.4–10.5)
Chloride: 101 mEq/L (ref 96–112)
GFR calc Af Amer: >90 mL/min (ref >90)
GFR calc non Af Amer: >90 mL/min (ref >90)
Glucose, Bld: 176 mg/dL — ABNORMAL HIGH (ref 70–99)
Potassium: 4.3 mEq/L (ref 3.7–5.3)
Sodium: 140 mEq/L (ref 137–147)

## 2013-10-05 LAB — TROPONIN I: Troponin I: <0.3 ng/mL (ref <0.30)

## 2013-10-05 LAB — D-DIMER, QUANTITATIVE (NOT AT ARMC): D-Dimer, Quant: 0.27 ug/mL-FEU (ref 0.00–0.48)

## 2013-10-05 LAB — PRO B NATRIURETIC PEPTIDE: PRO B NATRI PEPTIDE: 117.1 pg/mL (ref 0–125)

## 2013-10-05 MED ORDER — ONDANSETRON HCL 4 MG/2ML IJ SOLN
4.0000 mg | Freq: Once | INTRAMUSCULAR | Status: AC
  Administered 2013-10-05: 4 mg via INTRAVENOUS
  Filled 2013-10-05: qty 2

## 2013-10-05 MED ORDER — HYDROMORPHONE HCL PF 1 MG/ML IJ SOLN
1.0000 mg | Freq: Once | INTRAMUSCULAR | Status: AC
  Administered 2013-10-05: 1 mg via INTRAVENOUS
  Filled 2013-10-05: qty 1

## 2013-10-05 MED ORDER — ENOXAPARIN SODIUM 40 MG/0.4ML ~~LOC~~ SOLN
40.0000 mg | SUBCUTANEOUS | Status: DC
  Administered 2013-10-05 – 2013-10-08 (×4): 40 mg via SUBCUTANEOUS
  Filled 2013-10-05 (×5): qty 0.4

## 2013-10-05 MED ORDER — DIPHENHYDRAMINE HCL 25 MG PO CAPS
25.0000 mg | ORAL_CAPSULE | Freq: Every evening | ORAL | Status: DC | PRN
  Administered 2013-10-05: 50 mg via ORAL
  Filled 2013-10-05: qty 2

## 2013-10-05 MED ORDER — SODIUM CHLORIDE 0.9 % IV SOLN
INTRAVENOUS | Status: DC
  Administered 2013-10-05 – 2013-10-06 (×2): via INTRAVENOUS

## 2013-10-05 MED ORDER — IPRATROPIUM-ALBUTEROL 0.5-2.5 (3) MG/3ML IN SOLN
3.0000 mL | Freq: Once | RESPIRATORY_TRACT | Status: AC
  Administered 2013-10-05: 3 mL via RESPIRATORY_TRACT
  Filled 2013-10-05: qty 3

## 2013-10-05 MED ORDER — NAPROXEN 250 MG PO TABS
250.0000 mg | ORAL_TABLET | Freq: Two times a day (BID) | ORAL | Status: DC | PRN
  Administered 2013-10-05: 250 mg via ORAL
  Filled 2013-10-05: qty 1

## 2013-10-05 MED ORDER — LORAZEPAM 1 MG PO TABS
1.0000 mg | ORAL_TABLET | Freq: Four times a day (QID) | ORAL | Status: DC | PRN
  Administered 2013-10-05 – 2013-10-07 (×6): 1 mg via ORAL
  Filled 2013-10-05 (×6): qty 1

## 2013-10-05 MED ORDER — LEVALBUTEROL HCL 0.63 MG/3ML IN NEBU
0.6300 mg | INHALATION_SOLUTION | RESPIRATORY_TRACT | Status: DC | PRN
  Administered 2013-10-05: 0.63 mg via RESPIRATORY_TRACT
  Filled 2013-10-05 (×2): qty 3

## 2013-10-05 MED ORDER — PREDNISONE 20 MG PO TABS
60.0000 mg | ORAL_TABLET | Freq: Once | ORAL | Status: AC
  Administered 2013-10-05: 60 mg via ORAL
  Filled 2013-10-05: qty 3

## 2013-10-05 MED ORDER — ACETAMINOPHEN 325 MG PO TABS: 650.0000 mg | ORAL_TABLET | Freq: Four times a day (QID) | ORAL | Status: DC | PRN

## 2013-10-05 MED ORDER — LEVALBUTEROL HCL 0.63 MG/3ML IN NEBU
0.6300 mg | INHALATION_SOLUTION | RESPIRATORY_TRACT | Status: DC | PRN
  Administered 2013-10-05 – 2013-10-08 (×11): 0.63 mg via RESPIRATORY_TRACT
  Filled 2013-10-05 (×9): qty 3

## 2013-10-05 MED ORDER — ALBUTEROL (5 MG/ML) CONTINUOUS INHALATION SOLN
15.0000 mg | INHALATION_SOLUTION | Freq: Once | RESPIRATORY_TRACT | Status: AC
  Administered 2013-10-05: 15 mg via RESPIRATORY_TRACT
  Filled 2013-10-05: qty 20

## 2013-10-05 MED ORDER — LEVALBUTEROL HCL 0.63 MG/3ML IN NEBU
0.6300 mg | INHALATION_SOLUTION | Freq: Four times a day (QID) | RESPIRATORY_TRACT | Status: DC
  Administered 2013-10-05 – 2013-10-06 (×2): 0.63 mg via RESPIRATORY_TRACT
  Filled 2013-10-05 (×6): qty 3

## 2013-10-05 MED ORDER — ACETAMINOPHEN 650 MG RE SUPP: 650.0000 mg | Freq: Four times a day (QID) | RECTAL | Status: DC | PRN

## 2013-10-05 MED ORDER — SODIUM CHLORIDE 0.9 % IV SOLN: INTRAVENOUS | Status: DC

## 2013-10-05 MED ORDER — NAPROXEN 500 MG PO TABS
500.0000 mg | ORAL_TABLET | Freq: Two times a day (BID) | ORAL | Status: DC | PRN
  Filled 2013-10-05: qty 1

## 2013-10-05 MED ORDER — MORPHINE SULFATE 4 MG/ML IJ SOLN
4.0000 mg | Freq: Once | INTRAMUSCULAR | Status: DC
  Filled 2013-10-05: qty 1

## 2013-10-05 MED ORDER — ONDANSETRON HCL 4 MG/2ML IJ SOLN
4.0000 mg | Freq: Four times a day (QID) | INTRAMUSCULAR | Status: DC | PRN
  Administered 2013-10-05 – 2013-10-09 (×9): 4 mg via INTRAVENOUS
  Filled 2013-10-05 (×9): qty 2

## 2013-10-05 MED ORDER — METHYLPREDNISOLONE SODIUM SUCC 125 MG IJ SOLR
60.0000 mg | Freq: Two times a day (BID) | INTRAMUSCULAR | Status: DC
  Administered 2013-10-05: 60 mg via INTRAVENOUS
  Filled 2013-10-05 (×4): qty 0.96

## 2013-10-05 MED ORDER — NAPROXEN 250 MG PO TABS: 250.0000 mg | ORAL_TABLET | Freq: Two times a day (BID) | ORAL | Status: DC | PRN

## 2013-10-05 MED ORDER — IPRATROPIUM-ALBUTEROL 0.5-2.5 (3) MG/3ML IN SOLN
3.0000 mL | Freq: Four times a day (QID) | RESPIRATORY_TRACT | Status: DC
  Administered 2013-10-05: 3 mL via RESPIRATORY_TRACT
  Filled 2013-10-05 (×2): qty 3

## 2013-10-05 MED ORDER — LEVOFLOXACIN IN D5W 500 MG/100ML IV SOLN
500.0000 mg | INTRAVENOUS | Status: DC
  Administered 2013-10-05: 500 mg via INTRAVENOUS
  Filled 2013-10-05 (×2): qty 100

## 2013-10-05 MED ORDER — ONDANSETRON HCL 4 MG PO TABS
4.0000 mg | ORAL_TABLET | Freq: Four times a day (QID) | ORAL | Status: DC | PRN
  Administered 2013-10-07: 4 mg via ORAL
  Filled 2013-10-05: qty 1

## 2013-10-05 MED ORDER — GUAIFENESIN ER 600 MG PO TB12
600.0000 mg | ORAL_TABLET | Freq: Two times a day (BID) | ORAL | Status: DC
  Administered 2013-10-05 – 2013-10-08 (×6): 600 mg via ORAL
  Filled 2013-10-05 (×9): qty 1

## 2013-10-05 MED ORDER — TOPIRAMATE 100 MG PO TABS
100.0000 mg | ORAL_TABLET | Freq: Two times a day (BID) | ORAL | Status: DC
  Administered 2013-10-05 – 2013-10-09 (×8): 100 mg via ORAL
  Filled 2013-10-05 (×11): qty 1

## 2013-10-05 MED ORDER — HYDROCODONE-ACETAMINOPHEN 5-325 MG PO TABS
1.0000 | ORAL_TABLET | ORAL | Status: DC | PRN
  Administered 2013-10-05 – 2013-10-08 (×9): 1 via ORAL
  Filled 2013-10-05 (×11): qty 1

## 2013-10-05 MED ORDER — LORAZEPAM 2 MG/ML IJ SOLN
1.0000 mg | Freq: Once | INTRAMUSCULAR | Status: AC
  Administered 2013-10-05: 1 mg via INTRAVENOUS
  Filled 2013-10-05: qty 1

## 2013-10-05 NOTE — ED Notes (Signed)
Transporting [id number]

## 2013-10-05 NOTE — H&P (Addendum)
[facility] History and Physical  Lisa Ryan YDX:[id number] DOB: [birth year] DOA: 10/05/2013  Referring physician: EDP PCP: Nyoka Cowden, MD   Chief Complaint: shortness of breath and cough  HPI: Lisa Ryan is a 38 y.o. female with PMH of recurrent bronchitis, Migraine, former heavy smoker (2-2.5PPD for >20years) presents to the ER with the above complaints. She reports sore throat and sneezing from Monday, this progressed to cough, congestion, wheezing and shortness of breath. She went to the urgent care yesterday and was given a steroid shot and started on on levaquin and cough medicines, felt a little better after taking this yesterday but this morning started having severe bouts of cough with shortness of breath and came to the ER, noted to be tachypneic and tachycardic.   Review of Systems:  Constitutional:  No weight loss, night sweats, Fevers, chills, fatigue.  HEENT:  No headaches, Difficulty swallowing,Tooth/dental problems,Sore throat,  No sneezing, itching, ear ache, nasal congestion, post nasal drip,  Cardio-vascular:  No chest pain, Orthopnea, PND, swelling in lower extremities, anasarca, dizziness, palpitations  GI:  No heartburn, indigestion, abdominal pain, nausea, vomiting, diarrhea, change in bowel habits, loss of appetite  Resp:  No shortness of breath with exertion or at rest. No excess mucus, no productive cough, No non-productive cough, No coughing up of blood.No change in color of mucus.No wheezing.No chest wall deformity  Skin:  no rash or lesions.  GU:  no dysuria, change in color of urine, no urgency or frequency. No flank pain.  Musculoskeletal:  No joint pain or swelling. No decreased range of motion. No back pain.  Psych:  No change in mood or affect. No depression or anxiety. No memory loss.   Past Medical History  Diagnosis Date  . Anxiety state, unspecified 10/12/2007  . CHEST PAIN, ATYPICAL 02/18/2010  . CHEST  PAIN-PRECORDIAL 02/19/2010  . FATIGUE 09/28/2007  . GASTROENTERITIS 10/03/2008  . MIGRAINE HEADACHE 03/04/2010  . PEDAL EDEMA 02/18/2010  . PEPTIC ULCER DISEASE 09/28/2007  . 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 SEIZURE DISORDER 09/28/2007  . WEIGHT GAIN 09/28/2007  . Tobacco abuse   . GERD (gastroesophageal reflux disease)   . CHF (congestive heart failure)     During Pregnancy   Past Surgical History  Procedure Laterality Date  . Cholecystectomy    . Appendectomy    . Ankle surgery      left  . Knee arthroscopy      left  . Cesarean section      x3  . Mouth lesion excisional biopsy  04/2011    pre-cancerous, pallet of mouth   Social History:  reports that she quit smoking about 22 months ago. Her smoking use included Cigarettes. She has a 20 pack-year smoking history. She has never used smokeless tobacco. She reports that she does not drink alcohol or use illicit drugs.  Allergies  Allergen Reactions  . Morphine     hallunations    Family History  Problem Relation Age of Onset  . Cerebral aneurysm Mother   . Colon cancer Mother   . Heart disease Father   . Diabetes Brother 1    died age 52  . Liver disease Father   . Kidney disease Maternal Grandmother   . Colon cancer Maternal Aunt   . Colon cancer Maternal Grandmother   . Diabetes Sister      Prior to Admission medications   Medication Sig Start Date End Date Taking? Authorizing Provider  benzonatate (TESSALON) 100 MG  capsule Take 100 mg by mouth 3 (three) times daily as needed for cough.   Yes Historical Provider, MD  fluticasone-salmeterol (ADVAIR HFA) 115-21 MCG/ACT inhaler Inhale 2 puffs into the lungs 2 (two) times daily.   Yes Historical Provider, MD  levofloxacin (LEVAQUIN) 500 MG tablet Take 500 mg by mouth daily.   Yes Historical Provider, MD   Physical Exam: Filed Vitals:   10/05/13 1220  BP: 105/66  Pulse: 124  Temp: 98.2 F (36.8 C)  Resp: 20    BP 105/66  Pulse 124   Temp(Src) 98.2 F (36.8 C) (Oral)  Resp 20  Wt 81.647 kg (180 lb)  SpO2 98%  LMP 09/28/2013  General:  Appears calm and comfortable Eyes: PERRL, normal lids, irises & conjunctiva ENT: grossly normal hearing, lips & tongue Neck: no LAD, masses or thyromegaly Cardiovascular: RRR, no m/r/g. No LE edema. Respiratory: poor air movement B/l, scattered exp wheezes in upper lung zones Abdomen: soft, ntnd Skin: no rash or induration seen on limited exam Musculoskeletal: grossly normal tone BUE/BLE Psychiatric: grossly normal mood and affect, speech fluent and appropriate Neurologic: grossly non-focal.          Labs on Admission:  Basic Metabolic Panel:  Recent Labs Lab 10/05/13 0730  NA 140  K 4.3  CL 101  CO2 17*  GLUCOSE 176*  BUN 13  CREATININE 0.58  CALCIUM 9.7   Liver Function Tests: No results found for this basename: AST, ALT, ALKPHOS, BILITOT, PROT, ALBUMIN,  in the last 168 hours No results found for this basename: LIPASE, AMYLASE,  in the last 168 hours No results found for this basename: AMMONIA,  in the last 168 hours CBC:  Recent Labs Lab 10/05/13 0730  WBC 6.8  NEUTROABS 5.9  HGB 13.6  HCT 40.6  MCV 96.9  PLT 275   Cardiac Enzymes:  Recent Labs Lab 10/05/13 0730  TROPONINI <0.30    BNP (last 3 results)  Recent Labs  10/05/13 0730  PROBNP 117.1   CBG: No results found for this basename: GLUCAP,  in the last 168 hours  Radiological Exams on Admission: Dg Chest Port 1 View  10/05/2013   CLINICAL DATA:  pain  EXAM: PORTABLE CHEST - 1 VIEW  COMPARISON:  Two view chest dated 07/08/2013  FINDINGS: Low lung volumes. The heart size and mediastinal contours are within normal limits. Both lungs are clear. The visualized skeletal structures are unremarkable.  IMPRESSION: No active disease.   Electronically Signed   By: Margaree Mackintosh M.D.   On: 10/05/2013 08:04    EKG: Independently reviewed. NSR, minimal ST depression diffuse  leads  Assessment/Plan   1. Bronchitis/ Reactive airway disease -suspect she may have underling COPD, smoked 2-2.5PPD for over 20years, quit 2 years back, needs outpt PFTS, this was d/w pt -CXR clear -IV solumedrol, nebs, levaquin -mucinex  2. Nonspecific T wave changes on EKG -no chest pain -will cycle cardiac enzymes  DVT proph: lovenox  Code Status: Full Code Family Communication: none at bedside Disposition Plan: home tomorrow  Time spent: 74min  Tashawna Thom [facility] Pager [phone]  **Disclaimer: This note may have been dictated with voice recognition software. Similar sounding words can inadvertently be transcribed and this note may contain transcription errors which may not have been corrected upon publication of note.**

## 2013-10-05 NOTE — Progress Notes (Signed)
RT called to pts bedside at 1515 for neb tx. Rn did not say pt had increased WOB. RT came to eval pt for neb tx. RT pulled scheduled med duoneb. RT eval RR 28, Dry NPC, RA 98%, BBS clr, HR 130. Pt states that ED MD was going to order xopenex. RT logged in to chk order and returned duoneb and got the xopenex PRN neb. Pt became very agitated and told family member to get out of room due to  The ED MD didn't make her schedule nebs xopenex. Pt tol well . ReN aware of pts agitation-ativan given. Pt and family members say that they told five people about needing a neb tx before RT was called. Xopenex neb given with not complications. BBS clr, NPC, sats 96 on RA. Pt in no distress. Pt states she feels better now. She states she is sorry for getting agitated with RN and RT at bedside. RT will cont to follow.

## 2013-10-05 NOTE — ED Notes (Signed)
RT attempted peak flow check.  Pt was unable to perform procedure.  Notified Dr Eulis Foster.

## 2013-10-05 NOTE — Progress Notes (Signed)
Text page MD Broadus John to let let her know patient has arrived to floor.

## 2013-10-05 NOTE — ED Provider Notes (Signed)
CSN: [id number]     Arrival date & time 10/05/13  1572 History   First MD Initiated Contact with Patient 10/05/13 0701     Chief Complaint  Patient presents with  . Shortness of Breath     (Consider location/radiation/quality/duration/timing/severity/associated sxs/prior Treatment) HPI  Lisa Ryan is a 38 y.o. female who complains of shortness of breath for several days, associated with cough, productive of green sputum. This is similar to problems. She has had numerous times in the past. She saw her primary care provider yesterday and was started on treatment for bronchitis. Her symptoms worsened today, as she was showering, so she came here for evaluation. She is using her medicines as prescribed, without relief. She history of CHF during pregnancy, but does not have chronic cardiac problems and has had a cardiac catheterization. She denies sick contacts. She has not had a documented fever. She does not have ongoing chest pain. She denies nausea, vomiting, back, pain, weakness, or dizziness. There are no other known modifying factors.   Past Medical History  Diagnosis Date  . Anxiety state, unspecified 10/12/2007  . CHEST PAIN, ATYPICAL 02/18/2010  . CHEST PAIN-PRECORDIAL 02/19/2010  . FATIGUE 09/28/2007  . GASTROENTERITIS 10/03/2008  . MIGRAINE HEADACHE 03/04/2010  . PEDAL EDEMA 02/18/2010  . PEPTIC ULCER DISEASE 09/28/2007  . 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 SEIZURE DISORDER 09/28/2007  . WEIGHT GAIN 09/28/2007  . Tobacco abuse   . GERD (gastroesophageal reflux disease)   . CHF (congestive heart failure)     During Pregnancy   Past Surgical History  Procedure Laterality Date  . Cholecystectomy    . Appendectomy    . Ankle surgery      left  . Knee arthroscopy      left  . Cesarean section      x3  . Mouth lesion excisional biopsy  04/2011    pre-cancerous, pallet of mouth   Family History  Problem Relation Age of Onset  . Cerebral aneurysm  Mother   . Colon cancer Mother   . Heart disease Father   . Diabetes Brother 1    died age 38  . Liver disease Father   . Kidney disease Maternal Grandmother   . Colon cancer Maternal Aunt   . Colon cancer Maternal Grandmother   . Diabetes Sister    History  Substance Use Topics  . Smoking status: Former Smoker -- 1.00 packs/day for 20 years    Types: Cigarettes    Quit date: 12/03/2011  . Smokeless tobacco: Never Used     Comment: Gave pt sheet to quit smoking  . Alcohol Use: No   OB History   Grav Para Term Preterm Abortions TAB SAB Ect Mult Living                 Review of Systems  All other systems reviewed and are negative.     Allergies  Morphine  Home Medications   Prior to Admission medications   Medication Sig Start Date End Date Taking? Authorizing Provider  benzonatate (TESSALON) 100 MG capsule Take 100 mg by mouth 3 (three) times daily as needed for cough.   Yes Historical Provider, MD  fluticasone-salmeterol (ADVAIR HFA) 115-21 MCG/ACT inhaler Inhale 2 puffs into the lungs 2 (two) times daily.   Yes Historical Provider, MD  levofloxacin (LEVAQUIN) 500 MG tablet Take 500 mg by mouth daily.   Yes Historical Provider, MD   BP 118/52  Pulse 147  Temp(Src) 97.9  F (36.6 C) (Oral)  Resp 34  Wt 180 lb (81.647 kg)  SpO2 97%  LMP 09/28/2013 Physical Exam  Nursing note and vitals reviewed. Constitutional: She is oriented to person, place, and time. She appears well-developed and well-nourished.  HENT:  Head: Normocephalic and atraumatic.  Eyes: Conjunctivae and EOM are normal. Pupils are equal, round, and reactive to light.  Neck: Normal range of motion and phonation normal. Neck supple.  Cardiovascular: Normal rate, regular rhythm and intact distal pulses.   Pulmonary/Chest: Effort normal. She exhibits no tenderness.  Persistent cough. Decreased air movement bilaterally. Few scattered wheezes.  Abdominal: Soft. She exhibits no distension. There is no  tenderness. There is no guarding.  Musculoskeletal: Normal range of motion. She exhibits no edema and no tenderness.  Neurological: She is alert and oriented to person, place, and time. She exhibits normal muscle tone.  Skin: Skin is warm and dry.  Psychiatric: She has a normal mood and affect. Her behavior is normal. Judgment and thought content normal.    ED Course  Procedures (including critical care time)  Initial examination reveals abdominal exam, so suspect as respiratory as primary entity. Will evaluate for cardiac and metabolic abnormalities. R/O PE.  Medications  ipratropium-albuterol (DUONEB) 0.5-2.5 (3) MG/3ML nebulizer solution 3 mL (3 mLs Nebulization Given 10/05/13 0706)  predniSONE (DELTASONE) tablet 60 mg (60 mg Oral Given 10/05/13 0723)  albuterol (PROVENTIL,VENTOLIN) solution continuous neb (15 mg Nebulization Given 10/05/13 0721)    Patient Vitals for the past 24 hrs:  BP Temp Temp src Pulse Resp SpO2 Weight  10/05/13 0949 118/52 mmHg - - 147 34 97 % -  10/05/13 0930 118/52 mmHg - - 120 30 97 % -  10/05/13 0915 122/62 mmHg - - 124 20 96 % -  10/05/13 0900 131/62 mmHg - - 119 28 95 % -  10/05/13 0845 120/59 mmHg - - 115 26 96 % -  10/05/13 0830 121/57 mmHg - - 121 31 96 % -  10/05/13 0815 121/61 mmHg - - 120 19 99 % -  10/05/13 0800 119/56 mmHg - - 116 29 98 % -  10/05/13 0721 - - - - - 98 % -  10/05/13 0715 112/61 mmHg - - 110 37 100 % -  10/05/13 0700 129/69 mmHg - - 113 33 99 % -  10/05/13 0653 120/57 mmHg 97.9 F (36.6 C) Oral 108 21 98 % 180 lb (81.647 kg)    10:30 AM Reevaluation with update and discussion. After initial assessment and treatment, an updated evaluation reveals on ambulation trial, she was unable to stand up because of significant coughing, and elevation of heart rate to 140, with the effort of sitting. [place] Review Labs Reviewed  CBC WITH DIFFERENTIAL - Abnormal; Notable for the following:    Neutrophils Relative % 86 (*)     Lymphocytes Relative 11 (*)    All other components within normal limits  BASIC METABOLIC PANEL - Abnormal; Notable for the following:    CO2 17 (*)    Glucose, Bld 176 (*)    All other components within normal limits  PRO B NATRIURETIC PEPTIDE  TROPONIN I  D-DIMER, QUANTITATIVE    Imaging Review Dg Chest Port 1 View  10/05/2013   CLINICAL DATA:  pain  EXAM: PORTABLE CHEST - 1 VIEW  COMPARISON:  Two view chest dated 07/08/2013  FINDINGS: Low lung volumes. The heart size and mediastinal contours are within normal limits. Both lungs are clear.  The visualized skeletal structures are unremarkable.  IMPRESSION: No active disease.   Electronically Signed   By: Margaree Mackintosh M.D.   On: 10/05/2013 08:04     EKG Interpretation   Date/Time:  Friday October 05 2013 08:10:59 EDT Ventricular Rate:  122 PR Interval:  128 QRS Duration: 75 QT Interval:  344 QTC Calculation: 490 R Axis:   75 Text Interpretation:  Sinus tachycardia Ventricular premature complex  Minimal ST depression, diffuse leads Borderline prolonged QT interval  Since last tracing QT has lengthened and  Nonspecific ST abnormality is  new Confirmed by Deryl Ports  MD, Bhavya Eschete ([id number]) on 10/05/2013 8:52:51 AM      MDM   Final diagnoses:  Bronchitis  Abnormal EKG  Tachycardia    Bronchitis, without evidence for pneumonia or PE. Patient has significant distress, and cannot be managed as an outpatient, at this time. Abnormal EKG, is not indicative of a specific cardiac abnormality, but may need followup, as an outpatient.  Nursing Notes Reviewed/ Care Coordinated, and agree without changes. Applicable Imaging Reviewed.  Interpretation of Laboratory Data incorporated into ED treatment  Plan: Admit  Richarda Blade, MD 10/06/13 2354

## 2013-10-05 NOTE — ED Notes (Signed)
Unable to ambulate patient at this time.  Sat at side of bed and coughing uncontrollably.  Heart rate up to 147.

## 2013-10-05 NOTE — ED Notes (Signed)
Pt arrives from home with c/o [facility]. DX with bronchitis yesterday, had breathing tx, steroid shot and advair inhaler. Levofloxacin PO QD. Got up this morning for work and couldn't breathe

## 2013-10-05 NOTE — ED Notes (Signed)
Family at bedside.pt.undress in gown,&on monitor five lead

## 2013-10-05 NOTE — Progress Notes (Signed)
Pt attempted peak flow, frequent coughing and unable to register meter.

## 2013-10-05 NOTE — Progress Notes (Signed)
Patient from home with husband. Patient oriented to room and unit policy. Patient bed alarm set due to just receiving Dilaudid in ED.

## 2013-10-06 DIAGNOSIS — J45909 Unspecified asthma, uncomplicated: Secondary | ICD-10-CM

## 2013-10-06 DIAGNOSIS — G43909 Migraine, unspecified, not intractable, without status migrainosus: Secondary | ICD-10-CM

## 2013-10-06 LAB — CBC
HCT: 36.6 % (ref 36.0–46.0)
Hemoglobin: 12 g/dL (ref 12.0–15.0)
MCH: 32.2 pg (ref 26.0–34.0)
MCHC: 32.8 g/dL (ref 30.0–36.0)
MCV: 98.1 fL (ref 78.0–100.0)
PLATELETS: 250 10*3/uL (ref 150–400)
RBC: 3.73 MIL/uL — AB (ref 3.87–5.11)
RDW: 13.1 % (ref 11.5–15.5)
WBC: 14.8 10*3/uL — ABNORMAL HIGH (ref 4.0–10.5)

## 2013-10-06 LAB — BASIC METABOLIC PANEL
BUN: 11 mg/dL (ref 6–23)
CO2: 20 meq/L (ref 19–32)
CREATININE: 0.64 mg/dL (ref 0.50–1.10)
Calcium: 9.1 mg/dL (ref 8.4–10.5)
Chloride: 108 mEq/L (ref 96–112)
GFR calc Af Amer: >90 mL/min (ref >90)
Glucose, Bld: 146 mg/dL — ABNORMAL HIGH (ref 70–99)
Potassium: 4.3 mEq/L (ref 3.7–5.3)
Sodium: 140 mEq/L (ref 137–147)

## 2013-10-06 MED ORDER — METHYLPREDNISOLONE SODIUM SUCC 125 MG IJ SOLR
60.0000 mg | Freq: Three times a day (TID) | INTRAMUSCULAR | Status: DC
  Administered 2013-10-06 – 2013-10-08 (×6): 60 mg via INTRAVENOUS
  Filled 2013-10-06 (×2): qty 0.96
  Filled 2013-10-06: qty 2
  Filled 2013-10-06 (×3): qty 0.96
  Filled 2013-10-06: qty 2
  Filled 2013-10-06 (×4): qty 0.96

## 2013-10-06 MED ORDER — HYDROCOD POLST-CHLORPHEN POLST 10-8 MG/5ML PO LQCR
5.0000 mL | Freq: Two times a day (BID) | ORAL | Status: DC
  Administered 2013-10-06 – 2013-10-07 (×2): 5 mL via ORAL
  Filled 2013-10-06 (×2): qty 5

## 2013-10-06 MED ORDER — OXYMETAZOLINE HCL 0.05 % NA SOLN
1.0000 | Freq: Two times a day (BID) | NASAL | Status: AC
  Administered 2013-10-06 – 2013-10-08 (×6): 1 via NASAL
  Filled 2013-10-06: qty 15

## 2013-10-06 MED ORDER — LORATADINE 10 MG PO TABS
10.0000 mg | ORAL_TABLET | Freq: Every day | ORAL | Status: DC
  Administered 2013-10-06 – 2013-10-08 (×3): 10 mg via ORAL
  Filled 2013-10-06 (×3): qty 1

## 2013-10-06 MED ORDER — FLUTICASONE PROPIONATE 50 MCG/ACT NA SUSP
2.0000 | Freq: Every day | NASAL | Status: DC
Start: 2013-10-06 — End: 2013-10-09
  Administered 2013-10-06 – 2013-10-09 (×4): 2 via NASAL
  Filled 2013-10-06: qty 16

## 2013-10-06 MED ORDER — KETOROLAC TROMETHAMINE 30 MG/ML IJ SOLN
30.0000 mg | Freq: Four times a day (QID) | INTRAMUSCULAR | Status: DC | PRN
  Administered 2013-10-06 – 2013-10-08 (×4): 30 mg via INTRAVENOUS
  Filled 2013-10-06 (×4): qty 1

## 2013-10-06 MED ORDER — LEVOFLOXACIN IN D5W 750 MG/150ML IV SOLN
750.0000 mg | INTRAVENOUS | Status: DC
  Administered 2013-10-06 – 2013-10-08 (×3): 750 mg via INTRAVENOUS
  Filled 2013-10-06 (×3): qty 150

## 2013-10-06 MED ORDER — BENZONATATE 100 MG PO CAPS
200.0000 mg | ORAL_CAPSULE | Freq: Three times a day (TID) | ORAL | Status: DC | PRN
  Administered 2013-10-07 – 2013-10-08 (×2): 200 mg via ORAL
  Filled 2013-10-06 (×2): qty 2

## 2013-10-06 MED ORDER — PANTOPRAZOLE SODIUM 40 MG PO TBEC
40.0000 mg | DELAYED_RELEASE_TABLET | Freq: Every day | ORAL | Status: DC
  Administered 2013-10-06 – 2013-10-08 (×3): 40 mg via ORAL
  Filled 2013-10-06 (×3): qty 1

## 2013-10-06 MED ORDER — LEVALBUTEROL HCL 0.63 MG/3ML IN NEBU
0.6300 mg | INHALATION_SOLUTION | Freq: Four times a day (QID) | RESPIRATORY_TRACT | Status: DC
  Administered 2013-10-06 – 2013-10-08 (×9): 0.63 mg via RESPIRATORY_TRACT
  Filled 2013-10-06 (×17): qty 3

## 2013-10-06 MED ORDER — HYDROCOD POLST-CHLORPHEN POLST 10-8 MG/5ML PO LQCR
5.0000 mL | Freq: Two times a day (BID) | ORAL | Status: DC | PRN
  Administered 2013-10-06 – 2013-10-08 (×3): 5 mL via ORAL
  Filled 2013-10-06 (×4): qty 5

## 2013-10-06 NOTE — Progress Notes (Signed)
PATIENT DETAILS Name: Lisa Ryan Age: 38 y.o. Sex: female Date of Birth: [birth year] Admit Date: 10/05/2013 Admitting Physician Domenic Polite, MD GEZ:[id number],KPTWS Pilar Plate, MD  Subjective: Still coughing-SOB essentially unchanged  Assessment/Plan: Acute Asthmatic Bronchitis -essentially unchanged since admission.Moving air-but wheezing-but not in acute distress -change IV Solumedrol to 60 mg TID, change Levaquin to 750 mg. Change nebs to every 6 hours -claims to have nasal congestion-?provoking symptoms-start Afrin/Flonase and Claritin -c/w supportive care with Mucinex, add Tussionex prn -anxiety playing a role in her symptoms as well-c/w Lorazepam  Migraine headache -claims "stress" provoking headaches-prn Toradol, c/w Topamax  Non specific ST T changes in EKG -trop's negative, no chest pain, SOB from above.  Disposition: Remain inpatient  DVT Prophylaxis: Prophylactic Lovenox   Code Status: Full code   Family Communication Husband at bedside  Procedures:  None  CONSULTS:  None  Time spent 40 minutes-which includes 50% of the time with face-to-face with patient/ family and coordinating care related to the above assessment and plan.    MEDICATIONS: Scheduled Meds: . chlorpheniramine-HYDROcodone  5 mL Oral Q12H  . enoxaparin (LOVENOX) injection  40 mg Subcutaneous Q24H  . fluticasone  2 spray Each Nare Daily  . guaiFENesin  600 mg Oral BID  . levalbuterol  0.63 mg Nebulization Q6H  . levofloxacin (LEVAQUIN) IV  750 mg Intravenous Q24H  . loratadine  10 mg Oral Daily  . methylPREDNISolone (SOLU-MEDROL) injection  60 mg Intravenous 3 times per day  . oxymetazoline  1 spray Each Nare BID  . pantoprazole  40 mg Oral Q1200  . topiramate  100 mg Oral BID   Continuous Infusions:  PRN Meds:.acetaminophen, acetaminophen, benzonatate, HYDROcodone-acetaminophen, ketorolac, levalbuterol, LORazepam, ondansetron (ZOFRAN) IV,  ondansetron  Antibiotics: Anti-infectives   Start     Dose/Rate Route Frequency Ordered Stop   10/06/13 1400  levofloxacin (LEVAQUIN) IVPB 750 mg     750 mg 100 mL/hr over 90 Minutes Intravenous Every 24 hours 10/06/13 0850     10/05/13 1400  levofloxacin (LEVAQUIN) IVPB 500 mg  Status:  Discontinued     500 mg 100 mL/hr over 60 Minutes Intravenous Every 24 hours 10/05/13 1215 10/06/13 0850       PHYSICAL EXAM: Vital signs in last 24 hours: Filed Vitals:   10/05/13 2105 10/06/13 0500 10/06/13 0853 10/06/13 1306  BP: 112/72 103/65    Pulse: 98 83    Temp: 98.1 F (36.7 C) 97.9 F (36.6 C)    TempSrc: Oral Oral    Resp: 25 18    Height:      Weight:      SpO2: 97% 98% 97% 98%    Weight change:  Filed Weights   10/05/13 0653  Weight: 81.647 kg (180 lb)   Body mass index is 29.07 kg/(m^2).   Gen Exam: Awake and alert with clear speech.   Neck: Supple, No JVD.   Chest: Moving air bilaterally-+rhonchi  CVS: S1 S2 Regular, no murmurs.  Abdomen: soft, BS +, non tender, non distended.  Extremities: no edema, lower extremities warm to touch. Neurologic: Non Focal.   Skin: No Rash.   Wounds: N/A.   Intake/Output from previous day:  Intake/Output Summary (Last 24 hours) at 10/06/13 1318 Last data filed at 10/06/13 0952  Gross per 24 hour  Intake    360 ml  Output    600 ml  Net   -240 ml     LAB RESULTS: CBC  Recent Labs  Lab 10/05/13 0730 10/06/13 0600  WBC 6.8 14.8*  HGB 13.6 12.0  HCT 40.6 36.6  PLT 275 250  MCV 96.9 98.1  MCH 32.5 32.2  MCHC 33.5 32.8  RDW 12.8 13.1  LYMPHSABS 0.8  --   MONOABS 0.2  --   EOSABS 0.0  --   BASOSABS 0.0  --     Chemistries   Recent Labs Lab 10/05/13 0730 10/06/13 0600  NA 140 140  K 4.3 4.3  CL 101 108  CO2 17* 20  GLUCOSE 176* 146*  BUN 13 11  CREATININE 0.58 0.64  CALCIUM 9.7 9.1    CBG: No results found for this basename: GLUCAP,  in the last 168 hours  GFR Estimated Creatinine Clearance:  100.6 ml/min (by C-G formula based on Cr of 0.64).  Coagulation profile No results found for this basename: INR, PROTIME,  in the last 168 hours  Cardiac Enzymes  Recent Labs Lab 10/05/13 0730 10/05/13 1240 10/05/13 1842  TROPONINI <0.30 <0.30 <0.30    No components found with this basename: POCBNP,   Recent Labs  10/05/13 0730  DDIMER 0.27   No results found for this basename: HGBA1C,  in the last 72 hours No results found for this basename: CHOL, HDL, LDLCALC, TRIG, CHOLHDL, LDLDIRECT,  in the last 72 hours No results found for this basename: TSH, T4TOTAL, FREET3, T3FREE, THYROIDAB,  in the last 72 hours No results found for this basename: VITAMINB12, FOLATE, FERRITIN, TIBC, IRON, RETICCTPCT,  in the last 72 hours No results found for this basename: LIPASE, AMYLASE,  in the last 72 hours  Urine Studies No results found for this basename: UACOL, UAPR, USPG, UPH, UTP, UGL, UKET, UBIL, UHGB, UNIT, UROB, ULEU, UEPI, UWBC, URBC, UBAC, CAST, CRYS, UCOM, BILUA,  in the last 72 hours  MICROBIOLOGY: No results found for this or any previous visit (from the past 240 hour(s)).  RADIOLOGY STUDIES/RESULTS: Dg Chest Port 1 View  10/05/2013   CLINICAL DATA:  pain  EXAM: PORTABLE CHEST - 1 VIEW  COMPARISON:  Two view chest dated 07/08/2013  FINDINGS: Low lung volumes. The heart size and mediastinal contours are within normal limits. Both lungs are clear. The visualized skeletal structures are unremarkable.  IMPRESSION: No active disease.   Electronically Signed   By: Margaree Mackintosh M.D.   On: 10/05/2013 08:04    Shaun Zuccaro Kristeen Mans, MD  [facility] Pager:[phone]  If 7PM-7AM, please contact night-coverage [email or web address] Password [email or web address] 10/06/2013, 1:18 PM   LOS: 1 day   **Disclaimer: This note may have been dictated with voice recognition software. Similar sounding words can inadvertently be transcribed and this note may contain transcription errors which may not have been  corrected upon publication of note.**

## 2013-10-06 NOTE — Progress Notes (Signed)
RN observed that when pt has activity she had coughing episodes and HR increases to >100bpm. Given PRN meds per MD order. O2 sat stable above >95%. We will continue to monitor.

## 2013-10-07 ENCOUNTER — Inpatient Hospital Stay (HOSPITAL_COMMUNITY): Payer: Commercial Managed Care - PPO

## 2013-10-07 DIAGNOSIS — F411 Generalized anxiety disorder: Secondary | ICD-10-CM

## 2013-10-07 LAB — BASIC METABOLIC PANEL
BUN: 17 mg/dL (ref 6–23)
CALCIUM: 9.2 mg/dL (ref 8.4–10.5)
CO2: 20 mEq/L (ref 19–32)
CREATININE: 0.78 mg/dL (ref 0.50–1.10)
Chloride: 108 mEq/L (ref 96–112)
GFR calc Af Amer: >90 mL/min (ref >90)
Glucose, Bld: 150 mg/dL — ABNORMAL HIGH (ref 70–99)
POTASSIUM: 4.7 meq/L (ref 3.7–5.3)
Sodium: 140 mEq/L (ref 137–147)

## 2013-10-07 LAB — PRO B NATRIURETIC PEPTIDE: Pro B Natriuretic peptide (BNP): 1087 pg/mL — ABNORMAL HIGH (ref 0–125)

## 2013-10-07 MED ORDER — ALPRAZOLAM 0.25 MG PO TABS
0.2500 mg | ORAL_TABLET | Freq: Three times a day (TID) | ORAL | Status: DC | PRN
  Administered 2013-10-07 – 2013-10-08 (×2): 0.25 mg via ORAL
  Filled 2013-10-07 (×2): qty 1

## 2013-10-07 MED ORDER — CLONAZEPAM 0.5 MG PO TABS
0.5000 mg | ORAL_TABLET | Freq: Two times a day (BID) | ORAL | Status: DC
  Administered 2013-10-07 – 2013-10-09 (×5): 0.5 mg via ORAL
  Filled 2013-10-07 (×5): qty 1

## 2013-10-07 MED ORDER — FUROSEMIDE 10 MG/ML IJ SOLN
20.0000 mg | Freq: Once | INTRAMUSCULAR | Status: AC
  Administered 2013-10-07: 20 mg via INTRAVENOUS
  Filled 2013-10-07: qty 2

## 2013-10-07 NOTE — Progress Notes (Signed)
PATIENT DETAILS Name: Lisa Ryan Age: 38 y.o. Sex: female Date of Birth: [birth year] Admit Date: 10/05/2013 Admitting Physician Domenic Polite, MD SMO:[id number],EFEOF FRANK, MD  Subjective: Still coughing-but some improvement today. Less SOB  Assessment/Plan: Acute Asthmatic Bronchitis -mild improvement today.Moving air-hardly any wheezing, comfortably lying down in bed -c/w IV Solumedrol,Levaquin and nebs -claims to have nasal congestion-?provoking symptoms-started Afrin/Flonase and Claritin-better today -c/w supportive care with Mucinex, add Tussionex prn -anxiety playing a role in her symptoms as well-change to scheduled Klonopin and add prn Xanax  Migraine headache -claims "stress" provoking headaches-prn Toradol, c/w Topamax  Non specific ST T changes in EKG -trop's negative, no chest pain, SOB from above.  Disposition: Remain inpatient  DVT Prophylaxis: Prophylactic Lovenox   Code Status: Full code   Family Communication Husband at bedside  Procedures:  None  CONSULTS:  None   MEDICATIONS: Scheduled Meds: . chlorpheniramine-HYDROcodone  5 mL Oral Q12H  . enoxaparin (LOVENOX) injection  40 mg Subcutaneous Q24H  . fluticasone  2 spray Each Nare Daily  . guaiFENesin  600 mg Oral BID  . levalbuterol  0.63 mg Nebulization Q6H  . levofloxacin (LEVAQUIN) IV  750 mg Intravenous Q24H  . loratadine  10 mg Oral Daily  . methylPREDNISolone (SOLU-MEDROL) injection  60 mg Intravenous 3 times per day  . oxymetazoline  1 spray Each Nare BID  . pantoprazole  40 mg Oral Q1200  . topiramate  100 mg Oral BID   Continuous Infusions:  PRN Meds:.acetaminophen, acetaminophen, benzonatate, chlorpheniramine-HYDROcodone, HYDROcodone-acetaminophen, ketorolac, levalbuterol, LORazepam, ondansetron (ZOFRAN) IV, ondansetron  Antibiotics: Anti-infectives   Start     Dose/Rate Route Frequency Ordered Stop   10/06/13 1400  levofloxacin (LEVAQUIN) IVPB 750 mg     750 mg 100 mL/hr over 90 Minutes Intravenous Every 24 hours 10/06/13 0850     10/05/13 1400  levofloxacin (LEVAQUIN) IVPB 500 mg  Status:  Discontinued     500 mg 100 mL/hr over 60 Minutes Intravenous Every 24 hours 10/05/13 1215 10/06/13 0850       PHYSICAL EXAM: Vital signs in last 24 hours: Filed Vitals:   10/07/13 0210 10/07/13 0445 10/07/13 0721 10/07/13 1014  BP:  105/67  122/71  Pulse: 66 69  81  Temp:  97.9 F (36.6 C)  97 F (36.1 C)  TempSrc:  Oral  Oral  Resp: 16 16  16   Height:      Weight:      SpO2:  94% 96% 94%    Weight change:  Filed Weights   10/05/13 0653  Weight: 81.647 kg (180 lb)   Body mass index is 29.07 kg/(m^2).   Gen Exam: Awake and alert with clear speech.   Neck: Supple, No JVD.   Chest: Moving air bilaterally-only a few scattered rhonchi CVS: S1 S2 Regular, no murmurs.  Abdomen: soft, BS +, non tender, non distended.  Extremities: no edema, lower extremities warm to touch. Neurologic: Non Focal.   Skin: No Rash.   Wounds: N/A.   Intake/Output from previous day:  Intake/Output Summary (Last 24 hours) at 10/07/13 1127 Last data filed at 10/06/13 1800  Gross per 24 hour  Intake    150 ml  Output      0 ml  Net    150 ml     LAB RESULTS: CBC  Recent Labs Lab 10/05/13 0730 10/06/13 0600  WBC 6.8 14.8*  HGB 13.6 12.0  HCT 40.6 36.6  PLT 275 250  MCV 96.9 98.1  MCH 32.5 32.2  MCHC 33.5 32.8  RDW 12.8 13.1  LYMPHSABS 0.8  --   MONOABS 0.2  --   EOSABS 0.0  --   BASOSABS 0.0  --     Chemistries   Recent Labs Lab 10/05/13 0730 10/06/13 0600 10/07/13 0441  NA 140 140 140  K 4.3 4.3 4.7  CL 101 108 108  CO2 17* 20 20  GLUCOSE 176* 146* 150*  BUN 13 11 17   CREATININE 0.58 0.64 0.78  CALCIUM 9.7 9.1 9.2    CBG: No results found for this basename: GLUCAP,  in the last 168 hours  GFR Estimated Creatinine Clearance: 100.6 ml/min (by C-G formula based on Cr of 0.78).  Coagulation profile No results found for  this basename: INR, PROTIME,  in the last 168 hours  Cardiac Enzymes  Recent Labs Lab 10/05/13 0730 10/05/13 1240 10/05/13 1842  TROPONINI <0.30 <0.30 <0.30    No components found with this basename: POCBNP,   Recent Labs  10/05/13 0730  DDIMER 0.27   No results found for this basename: HGBA1C,  in the last 72 hours No results found for this basename: CHOL, HDL, LDLCALC, TRIG, CHOLHDL, LDLDIRECT,  in the last 72 hours No results found for this basename: TSH, T4TOTAL, FREET3, T3FREE, THYROIDAB,  in the last 72 hours No results found for this basename: VITAMINB12, FOLATE, FERRITIN, TIBC, IRON, RETICCTPCT,  in the last 72 hours No results found for this basename: LIPASE, AMYLASE,  in the last 72 hours  Urine Studies No results found for this basename: UACOL, UAPR, USPG, UPH, UTP, UGL, UKET, UBIL, UHGB, UNIT, UROB, ULEU, UEPI, UWBC, URBC, UBAC, CAST, CRYS, UCOM, BILUA,  in the last 72 hours  MICROBIOLOGY: No results found for this or any previous visit (from the past 240 hour(s)).  RADIOLOGY STUDIES/RESULTS: Dg Chest Port 1 View  10/05/2013   CLINICAL DATA:  pain  EXAM: PORTABLE CHEST - 1 VIEW  COMPARISON:  Two view chest dated 07/08/2013  FINDINGS: Low lung volumes. The heart size and mediastinal contours are within normal limits. Both lungs are clear. The visualized skeletal structures are unremarkable.  IMPRESSION: No active disease.   Electronically Signed   By: Margaree Mackintosh M.D.   On: 10/05/2013 08:04    Necie Wilcoxson Kristeen Mans, MD  [facility] Pager:[phone]  If 7PM-7AM, please contact night-coverage [email or web address] Password [email or web address] 10/07/2013, 11:27 AM   LOS: 2 days   **Disclaimer: This note may have been dictated with voice recognition software. Similar sounding words can inadvertently be transcribed and this note may contain transcription errors which may not have been corrected upon publication of note.**

## 2013-10-07 NOTE — Progress Notes (Signed)
Patient ambulated to bathroom with NT and spouse.  NT called me to room when patient became SOB, dizzy, extremely weak.  NT states she had to assist patient to sitting position to keep her from falling.  Upon entering room, patient was sitting on side of bed panting with husband comforting her.  Obtained VS.  VSS.  Patient coughing.  Assisted patient lay down.  Patient c/o migraine coming on requesting something for pain.  Norco given at [phone], and since patient just had the symptoms above, told patient to rest and see if the headache got any better.  Will continue to monitor patient.  Brita Romp, RN

## 2013-10-08 DIAGNOSIS — R059 Cough, unspecified: Secondary | ICD-10-CM

## 2013-10-08 DIAGNOSIS — R05 Cough: Secondary | ICD-10-CM

## 2013-10-08 DIAGNOSIS — R0602 Shortness of breath: Secondary | ICD-10-CM

## 2013-10-08 MED ORDER — HYDROCOD POLST-CHLORPHEN POLST 10-8 MG/5ML PO LQCR
5.0000 mL | Freq: Two times a day (BID) | ORAL | Status: DC
  Administered 2013-10-08 – 2013-10-09 (×2): 5 mL via ORAL
  Filled 2013-10-08 (×2): qty 5

## 2013-10-08 MED ORDER — TRAMADOL HCL 50 MG PO TABS
50.0000 mg | ORAL_TABLET | Freq: Three times a day (TID) | ORAL | Status: DC
  Administered 2013-10-08 – 2013-10-09 (×4): 50 mg via ORAL
  Filled 2013-10-08 (×4): qty 1

## 2013-10-08 MED ORDER — FUROSEMIDE 10 MG/ML IJ SOLN
20.0000 mg | Freq: Once | INTRAMUSCULAR | Status: AC
  Administered 2013-10-08: 20 mg via INTRAVENOUS
  Filled 2013-10-08: qty 2

## 2013-10-08 MED ORDER — DIPHENHYDRAMINE HCL 25 MG PO CAPS
25.0000 mg | ORAL_CAPSULE | Freq: Once | ORAL | Status: AC
  Administered 2013-10-08: 25 mg via ORAL
  Filled 2013-10-08: qty 1

## 2013-10-08 MED ORDER — PREDNISONE 20 MG PO TABS: 20.0000 mg | ORAL_TABLET | Freq: Every day | ORAL | Status: DC

## 2013-10-08 MED ORDER — METHYLPREDNISOLONE SODIUM SUCC 40 MG IJ SOLR
40.0000 mg | Freq: Three times a day (TID) | INTRAMUSCULAR | Status: DC
  Administered 2013-10-08: 40 mg via INTRAVENOUS
  Filled 2013-10-08 (×3): qty 1

## 2013-10-08 MED ORDER — PREDNISONE 50 MG PO TABS
60.0000 mg | ORAL_TABLET | Freq: Every day | ORAL | Status: DC
  Administered 2013-10-09: 60 mg via ORAL
  Filled 2013-10-08 (×2): qty 1

## 2013-10-08 MED ORDER — AZITHROMYCIN 500 MG PO TABS
500.0000 mg | ORAL_TABLET | Freq: Every day | ORAL | Status: AC
  Administered 2013-10-08: 500 mg via ORAL
  Filled 2013-10-08: qty 1

## 2013-10-08 MED ORDER — AZITHROMYCIN 250 MG PO TABS
250.0000 mg | ORAL_TABLET | Freq: Every day | ORAL | Status: DC
  Administered 2013-10-09: 250 mg via ORAL
  Filled 2013-10-08: qty 1

## 2013-10-08 MED ORDER — PREDNISONE 20 MG PO TABS: 40.0000 mg | ORAL_TABLET | Freq: Every day | ORAL | Status: DC

## 2013-10-08 MED ORDER — KETOROLAC TROMETHAMINE 15 MG/ML IJ SOLN
15.0000 mg | Freq: Once | INTRAMUSCULAR | Status: AC
  Administered 2013-10-08: 15 mg via INTRAVENOUS
  Filled 2013-10-08: qty 1

## 2013-10-08 MED ORDER — PANTOPRAZOLE SODIUM 40 MG PO TBEC
40.0000 mg | DELAYED_RELEASE_TABLET | Freq: Two times a day (BID) | ORAL | Status: DC
  Administered 2013-10-08 – 2013-10-09 (×2): 40 mg via ORAL
  Filled 2013-10-08 (×2): qty 1

## 2013-10-08 NOTE — Progress Notes (Signed)
CARE MANAGEMENT NOTE 10/08/2013  Patient:  Lisa Ryan, Lisa Ryan   Account Number:  [id number]  Date Initiated:  10/08/2013  Documentation initiated by:  Tomi Bamberger  Subjective/Objective Assessment:   dx sob  admit- lives with spouse.     Action/Plan:   Anticipated DC Date:  10/09/2013   Anticipated DC Plan:  [place]  CM consult      Choice offered to / List presented to:             Status of service:  In process, will continue to follow Medicare Important Message given?   (If response is "NO", the following Medicare IM given date fields will be blank) Date Medicare IM given:   Date Additional Medicare IM given:    Discharge Disposition:    Per UR Regulation:  Reviewed for med. necessity/level of care/duration of stay  If discussed at [place] of Stay Meetings, dates discussed:    Comments:

## 2013-10-08 NOTE — Progress Notes (Signed)
  Echocardiogram 2D Echocardiogram has been performed.  Lisa Ryan 10/08/2013, 8:47 AM

## 2013-10-08 NOTE — Progress Notes (Signed)
PATIENT DETAILS Name: Lisa Ryan Age: 38 y.o. Sex: female Date of Birth: 11-21-1975 Admit Date: 10/05/2013 Admitting Physician Domenic Polite, MD INO:[id number],GGEZM FRANK, MD  Subjective: Still coughing-but breathing better. Anxious!  Assessment/Plan: Acute Asthmatic Bronchitis -not much improvement today.Moving air-hardly any wheezing, comfortably lying down in bed, but starts coughing immediately on sitting and standing! -c/w IV Solumedrol-but taper,Levaquin-stop date 6/9 and nebs -claimed to have nasal congestion-?provoking symptoms-started Afrin/Flonase and Claritin -c/w supportive care with Mucinex, add Tussionex prn -anxiety playing a BIG role in her symptoms as well-c/w scheduled Klonopin and add prn Xanax. When offered Psych eval-she has refused today -requesting Pul consult-claims has seen Dr Joya Gaskins "before"-have asked PCCM to provide second opinion  Migraine headache -claims "stress" provoking headaches-prn Toradol, c/w Topamax  Non specific ST T changes in EKG -trop's negative, no chest pain, SOB from above.  Disposition: Remain inpatient  DVT Prophylaxis: Prophylactic Lovenox   Code Status: Full code   Family Communication Niece at bedside  Procedures:  None  CONSULTS:  None   MEDICATIONS: Scheduled Meds: . clonazePAM  0.5 mg Oral BID  . enoxaparin (LOVENOX) injection  40 mg Subcutaneous Q24H  . fluticasone  2 spray Each Nare Daily  . furosemide  20 mg Intravenous Once  . guaiFENesin  600 mg Oral BID  . levalbuterol  0.63 mg Nebulization Q6H  . levofloxacin (LEVAQUIN) IV  750 mg Intravenous Q24H  . loratadine  10 mg Oral Daily  . methylPREDNISolone (SOLU-MEDROL) injection  40 mg Intravenous 3 times per day  . oxymetazoline  1 spray Each Nare BID  . pantoprazole  40 mg Oral Q1200  . topiramate  100 mg Oral BID  . traMADol  50 mg Oral TID   Continuous Infusions:  PRN Meds:.acetaminophen, acetaminophen, ALPRAZolam, benzonatate,  chlorpheniramine-HYDROcodone, HYDROcodone-acetaminophen, ketorolac, levalbuterol, ondansetron (ZOFRAN) IV, ondansetron  Antibiotics: Anti-infectives   Start     Dose/Rate Route Frequency Ordered Stop   10/06/13 1400  levofloxacin (LEVAQUIN) IVPB 750 mg     750 mg 100 mL/hr over 90 Minutes Intravenous Every 24 hours 10/06/13 0850     10/05/13 1400  levofloxacin (LEVAQUIN) IVPB 500 mg  Status:  Discontinued     500 mg 100 mL/hr over 60 Minutes Intravenous Every 24 hours 10/05/13 1215 10/06/13 0850       PHYSICAL EXAM: Vital signs in last 24 hours: Filed Vitals:   10/08/13 0213 10/08/13 0400 10/08/13 1042 10/08/13 1311  BP:  105/68  101/67  Pulse: 73 70  70  Temp:  98 F (36.7 C)  98.2 F (36.8 C)  TempSrc:  Oral  Oral  Resp: 16 16  16   Height:      Weight:      SpO2: 95% 96% 94% 96%    Weight change:  Filed Weights   10/05/13 0653  Weight: 81.647 kg (180 lb)   Body mass index is 29.07 kg/(m^2).   Gen Exam: Awake and alert with clear speech.  Comfortable when lying-starts vigorous coughing spells immediately on sitting and standing Neck: Supple, No JVD.   Chest: Moving air bilaterally-only a few scattered rhonchi CVS: S1 S2 Regular, no murmurs.  Abdomen: soft, BS +, non tender, non distended.  Extremities: no edema, lower extremities warm to touch. Neurologic: Non Focal.   Skin: No Rash.   Wounds: N/A.   Intake/Output from previous day:  Intake/Output Summary (Last 24 hours) at 10/08/13 1415 Last data filed at 10/08/13 0525  Gross per  24 hour  Intake    240 ml  Output      0 ml  Net    240 ml     LAB RESULTS: CBC  Recent Labs Lab 10/05/13 0730 10/06/13 0600  WBC 6.8 14.8*  HGB 13.6 12.0  HCT 40.6 36.6  PLT 275 250  MCV 96.9 98.1  MCH 32.5 32.2  MCHC 33.5 32.8  RDW 12.8 13.1  LYMPHSABS 0.8  --   MONOABS 0.2  --   EOSABS 0.0  --   BASOSABS 0.0  --     Chemistries   Recent Labs Lab 10/05/13 0730 10/06/13 0600 10/07/13 0441  NA 140 140  140  K 4.3 4.3 4.7  CL 101 108 108  CO2 17* 20 20  GLUCOSE 176* 146* 150*  BUN 13 11 17   CREATININE 0.58 0.64 0.78  CALCIUM 9.7 9.1 9.2    CBG: No results found for this basename: GLUCAP,  in the last 168 hours  GFR Estimated Creatinine Clearance: 100.6 ml/min (by C-G formula based on Cr of 0.78).  Coagulation profile No results found for this basename: INR, PROTIME,  in the last 168 hours  Cardiac Enzymes  Recent Labs Lab 10/05/13 0730 10/05/13 1240 10/05/13 1842  TROPONINI <0.30 <0.30 <0.30    No components found with this basename: POCBNP,  No results found for this basename: DDIMER,  in the last 72 hours No results found for this basename: HGBA1C,  in the last 72 hours No results found for this basename: CHOL, HDL, LDLCALC, TRIG, CHOLHDL, LDLDIRECT,  in the last 72 hours No results found for this basename: TSH, T4TOTAL, FREET3, T3FREE, THYROIDAB,  in the last 72 hours No results found for this basename: VITAMINB12, FOLATE, FERRITIN, TIBC, IRON, RETICCTPCT,  in the last 72 hours No results found for this basename: LIPASE, AMYLASE,  in the last 72 hours  Urine Studies No results found for this basename: UACOL, UAPR, USPG, UPH, UTP, UGL, UKET, UBIL, UHGB, UNIT, UROB, ULEU, UEPI, UWBC, URBC, UBAC, CAST, CRYS, UCOM, BILUA,  in the last 72 hours  MICROBIOLOGY: No results found for this or any previous visit (from the past 240 hour(s)).  RADIOLOGY STUDIES/RESULTS: Dg Chest Port 1 View  10/05/2013   CLINICAL DATA:  pain  EXAM: PORTABLE CHEST - 1 VIEW  COMPARISON:  Two view chest dated 07/08/2013  FINDINGS: Low lung volumes. The heart size and mediastinal contours are within normal limits. Both lungs are clear. The visualized skeletal structures are unremarkable.  IMPRESSION: No active disease.   Electronically Signed   By: Margaree Mackintosh M.D.   On: 10/05/2013 08:04    Shanker Kristeen Mans, MD  [facility] Pager:[phone]  If 7PM-7AM, please contact  night-coverage [email or web address] Password [email or web address] 10/08/2013, 2:15 PM   LOS: 3 days   **Disclaimer: This note may have been dictated with voice recognition software. Similar sounding words can inadvertently be transcribed and this note may contain transcription errors which may not have been corrected upon publication of note.**

## 2013-10-08 NOTE — Plan of Care (Cosign Needed)
Paged by RN- pt with right neck,mid chest pain-also requesting sleep aid-Toradol x 1 given as well as Benadryl x 1- EKG pending-previous EKG/TNI this admission for similar c/o nonischemic noting pt admitted for acute bronchitis and suspect this is chest wall pain  Erin Hearing, ANP

## 2013-10-08 NOTE — Consult Note (Signed)
Name: Lisa Ryan MRN: [id number] DOB: 16-Jan-1976    ADMISSION DATE:  10/05/2013 CONSULTATION DATE:  10/08/13  REFERRING MD :  Dr. Sloan Leiter PRIMARY SERVICE:  TRH  CHIEF COMPLAINT:  Cough  BRIEF PATIENT DESCRIPTION: 38 y/o F   SIGNIFICANT EVENTS / STUDIES:  6/05 - Admit with cough, SOB, sore throat, sneezing, congestion.   6/08 - Pulmonary consult for cough with SOB & cough to the point of pt feeling pre-syncopal 6/08 - ECHO >> nml LV, EF 60-65%, no WMA, no diastolic dysfunction  ANTIBIOTICS: Levaquin 6/5 >>  HISTORY OF PRESENT ILLNESS:  38 y/o F, former smoker,  with PMH of PUD with remote perforation requiring oversewing (80's), Gastritis related to NSAID usage (EGD in 2013), Migraines, Seizure Disorder (not on medications), Atypical Chest Pain (neg heart cath in 2011 per Dr. Angelena Form), and Anxiety with prior [facility] admissions who presented to [facility] ER on 6/5 with a one week history of cough, sore throat, sneezing, nasal congestion, urination on self with sneezing.  She reports having seasonal allergies and only takes meds "when she needs them".  She also relays cough with green / yellow sputum production & fever up to 104 at home (afebrile on presentation).  Patient was seen in an urgent care on [address] and was treated with a steroid injection, 7 days of oral abx and cough suppression.   She took one day of antibiotics on 6/4 and was unable to go to work on 6/5 and presented to ED for further evaluation.  Patient was admitted per [facility] for bronchitis, cough & SOB.  She was treated with antibiotics, cough suppression, IV steroids, PPI, nasal steroid / hygiene, antihistamine and PPI.  Patient continued to have coughing episodes while inpatient to the point of feeling like she would pass out.  She indicates no improvement in her symptoms and is tearful when she relays her symptoms.  "I feel like I can't catch my breath and I urinate on myself.  That's not me".     Diagnostic work up notable for  cxr with bronchitic changes, proBNP 1087, negative troponin, mild elevation of WBC (post steroids, was normal on admit), negative ECHO and a negative D-Dimer.  PCCM called for pulmonary evaluation.     PAST MEDICAL HISTORY :  Past Medical History  Diagnosis Date  . Anxiety state, unspecified 10/12/2007  . CHEST PAIN, ATYPICAL 02/18/2010  . CHEST PAIN-PRECORDIAL 02/19/2010  . FATIGUE 09/28/2007  . GASTROENTERITIS 10/03/2008  . MIGRAINE HEADACHE 03/04/2010  . PEDAL EDEMA 02/18/2010  . PEPTIC ULCER DISEASE 09/28/2007  . 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 SEIZURE DISORDER 09/28/2007  . WEIGHT GAIN 09/28/2007  . Tobacco abuse   . GERD (gastroesophageal reflux disease)   . CHF (congestive heart failure)     During Pregnancy   Past Surgical History  Procedure Laterality Date  . Cholecystectomy    . Appendectomy    . Ankle surgery      left  . Knee arthroscopy      left  . Cesarean section      x3  . Mouth lesion excisional biopsy  04/2011    pre-cancerous, pallet of mouth   Prior to Admission medications   Medication Sig Start Date End Date Taking? Authorizing Provider  benzonatate (TESSALON) 100 MG capsule Take 100 mg by mouth 3 (three) times daily as needed for cough.   Yes Historical Provider, MD  fluticasone-salmeterol (ADVAIR HFA) 115-21 MCG/ACT inhaler Inhale 2 puffs into the lungs 2 (two)  times daily.   Yes Historical Provider, MD  levofloxacin (LEVAQUIN) 500 MG tablet Take 500 mg by mouth daily.   Yes Historical Provider, MD   Allergies  Allergen Reactions  . Albuterol Anxiety and Other (See Comments)    Increased HR 130s  . Morphine     hallunations    FAMILY HISTORY:  Family History  Problem Relation Age of Onset  . Cerebral aneurysm Mother   . Colon cancer Mother   . Heart disease Father   . Diabetes Brother 1    died age 90  . Liver disease Father   . Kidney disease Maternal Grandmother   . Colon cancer Maternal Aunt   . Colon cancer  Maternal Grandmother   . Diabetes Sister    SOCIAL HISTORY:  reports that she quit smoking about 22 months ago. Her smoking use included Cigarettes. She has a 20 pack-year smoking history. She has never used smokeless tobacco. She reports that she does not drink alcohol or use illicit drugs.  REVIEW OF SYSTEMS:   Constitutional: Negative for chills, weight loss, malaise/fatigue and diaphoresis. Reports fevers.  HENT: Negative for hearing loss, ear pain, nosebleeds, congestion, sore throat, neck pain, tinnitus and ear discharge.   Eyes: Negative for blurred vision, double vision, photophobia, pain, discharge and redness.  Respiratory: Negative for hemoptysis,  wheezing and stridor.  Reports cough with sputum production, shortness of breath Cardiovascular: Negative for chest pain, palpitations, orthopnea, claudication, leg swelling and PND.  Gastrointestinal: Negative for heartburn, nausea, vomiting, abdominal pain, diarrhea, constipation, blood in stool and melena.  Genitourinary: Negative for dysuria, urgency, frequency, hematuria and flank pain.  Reports urinary incontinence with coughing  Musculoskeletal: Negative for myalgias, back pain, joint pain and falls.  Skin: Negative for itching and rash.  Neurological: Negative for dizziness, tingling, tremors, sensory change, speech change, focal weakness, seizures, loss of consciousness, weakness and headaches.  Endo/Heme/Allergies: Negative for environmental allergies and polydipsia. Does not bruise/bleed easily.  SUBJECTIVE:   VITAL SIGNS: Temp:  [98 F (36.7 C)-98.2 F (36.8 C)] 98.2 F (36.8 C) (06/08 1311) Pulse Rate:  [70-93] 70 (06/08 1311) Resp:  [16-18] 16 (06/08 1311) BP: (101-108)/(66-70) 101/67 mmHg (06/08 1311) SpO2:  [94 %-98 %] 96 % (06/08 1311)  PHYSICAL EXAMINATION: General:  wdwn adult female in NAD Neuro:  AAOx4, MAE HEENT:  Mm pink/moist, no jvd Cardiovascular:  s1s2 rrr, no m/r/g Lungs:  resp's even/non-labored,  lungs bilaterally clear  Abdomen:  Obese, soft, bsx4 active  Musculoskeletal:  No acute deformities Skin:  Warm/dry, no edema   Recent Labs Lab 10/05/13 0730 10/06/13 0600 10/07/13 0441  NA 140 140 140  K 4.3 4.3 4.7  CL 101 108 108  CO2 17* 20 20  BUN 13 11 17   CREATININE 0.58 0.64 0.78  GLUCOSE 176* 146* 150*    Recent Labs Lab 10/05/13 0730 10/06/13 0600  HGB 13.6 12.0  HCT 40.6 36.6  WBC 6.8 14.8*  PLT 275 250   Dg Chest Port 1 View  10/07/2013   CLINICAL DATA:  Cough and shortness of Breath.  EXAM: PORTABLE CHEST - 1 VIEW  COMPARISON:  10/05/2013.  FINDINGS: The cardiac silhouette, mediastinal and hilar contours are within normal limits and stable. Moderate bronchitic changes may suggest bronchitis. Very low lung volumes with vascular crowding and atelectasis. Could not exclude a small left effusion.  IMPRESSION: Bronchitic lung changes along with low lung volumes and vascular crowding atelectasis.   Electronically Signed   By: Kalman Jewels  M.D.   On: 10/07/2013 06:50    ASSESSMENT / PLAN:  Cyclic Cough - No infiltrate on CXR, clear film.  Negative cardiac work up.  Acute Bronchitis - ? Allergic component given presenting symptoms.  No infiltrate on CXR, clear film.  Negative cardiac work up.   Plan: -change abx to cover pertussis, Azithro 500 mg x1, then 250mg  QD x4 days -cough suppression: tussionex BID, tessalon pearls PRN -BID PPI -steroid taper to off > 60mg  x 3, 40mg  x 3, 20mg  x 3 then stop -follow up with pulmonary in office  -stop mucinex -encourage conscious cough suppression / more gentle cough -Consider follow up with Dr. Melvyn Novas after NP visit given smoking history (appt arranged)  Noe Gens, NP-C [facility] Pgr: [phone] or [phone]  10/08/2013, 1:49 PM  PCCM ATTENDING: I have interviewed and examined the patient and reviewed the database. I have formulated the assessment and plan as reflected in the note above with  amendments made by me.   This appears to be cyclical cough with a significant psychogenic overlay. Recommend the following  1) change abx to Z pak 2) Change steroids to PO pred with taper as ordered 3) Inhaled steroid/LABA combination for 3-4 wks and reassess need @ that time 4) Aggressive medical cough suppression as ordered - Scheduled Tussionex X 7 days, then PRN. PRN Tessalon Perles 5) Max acid suppression with PPI 6) Voice rest X 3-4 days 7) Conscious suppression of cough as able 8) She may be discharged @ any time from Pulmonary perspective 9) we have scheduled F/U in [place] office with NP @ end of this week  If she is still in hospital tomorrow, I will check on her again   Merton Border, MD;  PCCM service; Mobile [phone]

## 2013-10-08 NOTE — Progress Notes (Signed)
NP on call paged about pt CO right neck,mid chest pain-also requesting sleep aid.  NP placed orders for EKG.  EKG done, as well as VS; NP reviewed EKG.  VS WNL.  NP placed orders for toradol and benadryl.  Will continue to monitor patient.

## 2013-10-09 ENCOUNTER — Telehealth: Payer: Self-pay | Admitting: Internal Medicine

## 2013-10-09 MED ORDER — AZITHROMYCIN 250 MG PO TABS: ORAL_TABLET | ORAL | Status: DC

## 2013-10-09 MED ORDER — LEVALBUTEROL HCL 0.63 MG/3ML IN NEBU: 0.6300 mg | INHALATION_SOLUTION | RESPIRATORY_TRACT | Status: DC | PRN

## 2013-10-09 MED ORDER — FLUTICASONE-SALMETEROL 115-21 MCG/ACT IN AERO: 2.0000 | INHALATION_SPRAY | Freq: Two times a day (BID) | RESPIRATORY_TRACT | Status: DC

## 2013-10-09 MED ORDER — FLUTICASONE PROPIONATE 50 MCG/ACT NA SUSP: 2.0000 | Freq: Every day | NASAL | Status: DC

## 2013-10-09 MED ORDER — PREDNISONE 20 MG PO TABS: 20.0000 mg | ORAL_TABLET | Freq: Every day | ORAL | Status: DC

## 2013-10-09 MED ORDER — TOPIRAMATE 100 MG PO TABS: 100.0000 mg | ORAL_TABLET | Freq: Two times a day (BID) | ORAL | Status: DC

## 2013-10-09 MED ORDER — CLONAZEPAM 0.5 MG PO TABS: 0.5000 mg | ORAL_TABLET | Freq: Two times a day (BID) | ORAL | Status: DC | PRN

## 2013-10-09 MED ORDER — PANTOPRAZOLE SODIUM 40 MG PO TBEC: 40.0000 mg | DELAYED_RELEASE_TABLET | Freq: Two times a day (BID) | ORAL | Status: DC

## 2013-10-09 MED ORDER — HYDROCOD POLST-CHLORPHEN POLST 10-8 MG/5ML PO LQCR: 5.0000 mL | Freq: Two times a day (BID) | ORAL | Status: DC

## 2013-10-09 MED ORDER — TRAMADOL HCL 50 MG PO TABS: 50.0000 mg | ORAL_TABLET | Freq: Two times a day (BID) | ORAL | Status: DC | PRN

## 2013-10-09 NOTE — Discharge Summary (Signed)
Physician Discharge Summary  Lisa Ryan JEH:[id number] DOB: 03-Oct-1975 DOA: 10/05/2013  PCP: Lottie Dawson, MD  Admit date: 10/05/2013 Discharge date: 10/09/2013  Time spent: 50 minutes  Recommendations for Outpatient Follow-up:  1. Pulmonary follow for refractory acute bronchitis vs pertussis? 2. PCP follow for Migraine HA, Anxiety   Discharge Diagnoses:  Active Problems:   SEIZURE DISORDER   Bronchitis   Reactive airway disease   Former heavy tobacco smoker   COPD exacerbation   Acute asthmatic bronchitis   Cough   Discharge Condition: stable.  Diet recommendation: as tolerated.  Filed Weights   10/05/13 0653  Weight: 81.647 kg (180 lb)    History of present illness:  Lisa Ryan is a 37 y.o. female with PMH of recurrent bronchitis, Migraine, former heavy smoker (2-2.5PPD for >20years) presents to the ER with SOB and coughing spasms.  She reports sore throat and sneezing from Monday, this progressed to cough, congestion, wheezing and shortness of breath. She went to the urgent care yesterday and was given a steroid shot and started on on levaquin and cough medicines, felt a little better after taking this yesterday but this morning started having severe bouts of cough with shortness of breath and came to the ER, noted to be tachypneic and tachycardic.   Hospital Course:  Acute Asthmatic Bronchitis  -Patient slowly improved on IV solumedrol, levaquin and supportive treatment with nebulizers, tussionex, mucinex,  -Seen by Pulmonary who changed antibiotics on 6/8 to treat potential pertussis (Azith).  Will d/c with 3 more days of Azith. -Exam improved.  Patient reports improvement in symptoms. -claimed to have nasal congestion-?provoking symptoms-started Afrin/Flonase and Claritin  -anxiety played a BIG role in her symptoms as well.  Utilized scheduled Klonopin and add prn Xanax.  -When offered Psych eval-she refused   Migraine headache  -claims "stress"  provoking headaches-prn Toradol, c/w Topamax   Non specific ST T changes in EKG  -trop's negative, no chest pain, SOB from above.   Consultations: Pulmonology   Discharge Exam: Filed Vitals:   10/09/13 0456  BP: 119/76  Pulse: 56  Temp: 97.8 F (36.6 C)  Resp: 18   Gen Exam: Awake and alert with clear speech. Still with hacking cough. Friend at bedside. Neck: Supple, No JVD.  Chest: Moving air bilaterally-only a few scattered rhonchi  CVS: S1 S2 Regular, no murmurs.  Abdomen: soft, BS +, non tender, non distended.  Extremities: no edema, lower extremities warm to touch.  Neurologic: Non Focal.  Skin: No Rash.    Discharge Instructions       Discharge Instructions   Diet - low sodium heart healthy    Complete by:  As directed      Increase activity slowly    Complete by:  As directed             Medication List    STOP taking these medications       levofloxacin 500 MG tablet  Commonly known as:  LEVAQUIN      TAKE these medications       azithromycin 250 MG tablet  Commonly known as:  ZITHROMAX  Take once daily for 3 more days.     benzonatate 100 MG capsule  Commonly known as:  TESSALON  Take 100 mg by mouth 3 (three) times daily as needed for cough.     chlorpheniramine-HYDROcodone 10-8 MG/5ML Lqcr  Commonly known as:  TUSSIONEX  Take 5 mLs by mouth every 12 (twelve) hours.  clonazePAM 0.5 MG tablet  Commonly known as:  KLONOPIN  Take 1 tablet (0.5 mg total) by mouth 2 (two) times daily as needed for anxiety.     fluticasone 50 MCG/ACT nasal spray  Commonly known as:  FLONASE  Place 2 sprays into both nostrils daily.     fluticasone-salmeterol 115-21 MCG/ACT inhaler  Commonly known as:  ADVAIR HFA  Inhale 2 puffs into the lungs 2 (two) times daily.     levalbuterol 0.63 MG/3ML nebulizer solution  Commonly known as:  XOPENEX  Take 3 mLs (0.63 mg total) by nebulization every 4 (four) hours as needed for wheezing or shortness of breath.      pantoprazole 40 MG tablet  Commonly known as:  PROTONIX  Take 1 tablet (40 mg total) by mouth 2 (two) times daily.     predniSONE 20 MG tablet  Commonly known as:  DELTASONE  - Take 1 tablet (20 mg total) by mouth daily with breakfast. Take 60 mg with breakfast on 6/10, 6/11  - Take 40 mg with breakfast on 6/12 - 6/14  - Take 20 mg with breakfast on 6/15 - 6/17.   - Then stop  Start taking on:  10/15/2013     topiramate 100 MG tablet  Commonly known as:  TOPAMAX  Take 1 tablet (100 mg total) by mouth 2 (two) times daily.     traMADol 50 MG tablet  Commonly known as:  ULTRAM  Take 1 tablet (50 mg total) by mouth every 12 (twelve) hours as needed for moderate pain.       Allergies  Allergen Reactions  . Albuterol Anxiety and Other (See Comments)    Increased HR 130s  . Morphine     hallunations   Follow-up Information   Follow up with PARRETT,TAMMY, NP On 10/11/2013. (Appt at 3PM.   )    Specialty:  Nurse Practitioner   Contact information:   [address] [place] [address] [phone]       Follow up with Nyoka Cowden, MD On 10/23/2013. (Appointment with Dr. Leane Para is on 10/23/13 at 11:15am)    Specialty:  Internal Medicine   Contact information:   [place] [address] [phone]        The results of significant diagnostics from this hospitalization (including imaging, microbiology, ancillary and laboratory) are listed below for reference.    Significant Diagnostic Studies:  2D Echo Study Conclusions  - Left ventricle: The cavity size was normal. Wall thickness was normal. Systolic function was normal. The estimated ejection fraction was in the range of 60% to 65%. Wall motion was normal;there were no regional wall motion abnormalities. Left ventricular diastolic function parameters were normal. - Aortic valve: There was no stenosis. - Mitral valve: Mildly calcified annulus. There was trivial   regurgitation. - Right ventricle: The cavity size was normal. Systolic function was normal. - Pulmonary arteries: No complete TR doppler jet so unable to  estimate PA systolic pressure. - Inferior vena cava: The vessel was normal in size. The respirophasic diameter changes were in the normal range (>= 50%), consistent with normal central venous pressure.  Impressions: - Normal study.    Dg Chest Port 1 View  10/07/2013   CLINICAL DATA:  Cough and shortness of Breath.  EXAM: PORTABLE CHEST - 1 VIEW  COMPARISON:  10/05/2013.  FINDINGS: The cardiac silhouette, mediastinal and hilar contours are within normal limits and stable. Moderate bronchitic changes may suggest bronchitis. Very low lung  volumes with vascular crowding and atelectasis. Could not exclude a small left effusion.  IMPRESSION: Bronchitic lung changes along with low lung volumes and vascular crowding atelectasis.   Electronically Signed   By: Kalman Jewels M.D.   On: 10/07/2013 06:50   Dg Chest Port 1 View  10/05/2013   CLINICAL DATA:  pain  EXAM: PORTABLE CHEST - 1 VIEW  COMPARISON:  Two view chest dated 07/08/2013  FINDINGS: Low lung volumes. The heart size and mediastinal contours are within normal limits. Both lungs are clear. The visualized skeletal structures are unremarkable.  IMPRESSION: No active disease.   Electronically Signed   By: Margaree Mackintosh M.D.   On: 10/05/2013 08:04      Labs: Basic Metabolic Panel:  Recent Labs Lab 10/05/13 0730 10/06/13 0600 10/07/13 0441  NA 140 140 140  K 4.3 4.3 4.7  CL 101 108 108  CO2 17* 20 20  GLUCOSE 176* 146* 150*  BUN 13 11 17   CREATININE 0.58 0.64 0.78  CALCIUM 9.7 9.1 9.2   CBC:  Recent Labs Lab 10/05/13 0730 10/06/13 0600  WBC 6.8 14.8*  NEUTROABS 5.9  --   HGB 13.6 12.0  HCT 40.6 36.6  MCV 96.9 98.1  PLT 275 250   Cardiac Enzymes:  Recent Labs Lab 10/05/13 0730 10/05/13 1240 10/05/13 1842  TROPONINI <0.30 <0.30 <0.30   BNP: BNP (last 3  results)  Recent Labs  10/05/13 0730 10/07/13 0441  PROBNP 117.1 1087.0*       SignedKaren Kitchens [phone]  [facility] 10/09/2013, 1:12 PM  Attending Patient was seen, examined,treatment plan was discussed with the Physician extender. I have directly reviewed the clinical findings, lab, imaging studies and management of this patient in detail. I have made the necessary changes to the above noted documentation, and agree with the documentation, as recorded by the Physician extender.  Nena Alexander MD [facility].

## 2013-10-09 NOTE — Discharge Instructions (Signed)
You were cared for by a hospitalist during your hospital stay.  Once you are discharged, your primary care physician will handle any further medical issues. Please note that NO REFILLS for any discharge medications will be authorized once you are discharged, as it is imperative that you return to your primary care physician (or establish a relationship with a primary care physician if you do not have one) for your aftercare needs so that they can reassess your need for medications and monitor your lab values.

## 2013-10-09 NOTE — Telephone Encounter (Signed)
Pt needs post hos fup before Monday 10-15-13. Pt has switch from dr Burnice Logan. Can I use sda's. Pt is been [facility]

## 2013-10-09 NOTE — Telephone Encounter (Signed)
Pt has been sch

## 2013-10-09 NOTE — Progress Notes (Signed)
Pt discharged home. Iv d/ced - site unremarkable. Vitals stable, no complaints of pain. Discharge instructions discussed, patient verbalizes understanding. Prescriptions given. No oxygen needed. Skin intact. No complaints at this time, will continue to monitor until off floor. Lisa Ryan D Lisa Ryan

## 2013-10-09 NOTE — Telephone Encounter (Signed)
   She has appt with pulmonary  6/11 which seems to be the important problem   Very important to keep this appt, with pulmonary .  No need to be seen here this week    Had previous scheduled [facility] app[facility] for dr Raliegh Ip on June 23 that was cancelled .  Ok to add her on end  of day on June 23rd 30 minutes ( last 2 slots of day preferred )

## 2013-10-09 NOTE — Progress Notes (Signed)
Utilization review completed.  

## 2013-10-11 ENCOUNTER — Ambulatory Visit (INDEPENDENT_AMBULATORY_CARE_PROVIDER_SITE_OTHER): Payer: Commercial Managed Care - PPO | Admitting: Adult Health

## 2013-10-11 ENCOUNTER — Other Ambulatory Visit: Payer: Self-pay | Admitting: Adult Health

## 2013-10-11 ENCOUNTER — Encounter: Payer: Self-pay | Admitting: Adult Health

## 2013-10-11 VITALS — BP 120/78 | HR 92 | Temp 98.5°F | Ht 66.5 in | Wt 178.6 lb

## 2013-10-11 DIAGNOSIS — J441 Chronic obstructive pulmonary disease with (acute) exacerbation: Secondary | ICD-10-CM

## 2013-10-11 DIAGNOSIS — R05 Cough: Secondary | ICD-10-CM

## 2013-10-11 DIAGNOSIS — R059 Cough, unspecified: Secondary | ICD-10-CM

## 2013-10-11 MED ORDER — LEVALBUTEROL HCL 0.63 MG/3ML IN NEBU
0.6300 mg | INHALATION_SOLUTION | Freq: Once | RESPIRATORY_TRACT | Status: AC
  Administered 2013-10-11: 0.63 mg via RESPIRATORY_TRACT

## 2013-10-11 MED ORDER — CLOTRIMAZOLE 10 MG MT TROC: 10.0000 mg | Freq: Every day | OROMUCOSAL | Status: DC

## 2013-10-11 NOTE — Progress Notes (Signed)
   Subjective:    Patient ID: Lisa Ryan, female    DOB: 1975-09-12, 38 y.o.   MRN: [id number]  HPI 38 -year-old female seen for pulmonary consult for refractory asthmatic bronchitis on 10/08/2013 .   10/11/13 [facility] follow up  Patient returns for a post hospital followup. She was admitted June 5 through June 9 for refractory asthmatic bronchitis in the setting of heavy smoking.  She has an [place] of PUD with remote perforation requiring oversewing (80's), Gastritis related to NSAID usage (EGD in 2013), Migraines, Seizure Disorder (not on medications), Atypical Chest Pain (neg heart cath in 2011 per Dr. Angelena Form), and Anxiety with prior [facility]  s She was treated with antibiotics, cough suppression, IV steroids, PPI, nasal steroid / hygiene, antihistamine and PPI. Diagnostic work up notable for cxr with bronchitic changes, proBNP 1087, negative troponin, mild elevation of WBC (post steroids, was normal on admit), negative ECHO and a negative D-Dimer. Pulmonary consult during admit . She was recommended to treat with a Z-Pak along with steroid taper. She was placed on cough suppression regimen, along with reflux, prevention. Since discharge. Patient continues to have some cough. However, does seem to be slightly better. She denies any hemoptysis, fever, orthopnea, PND, or leg swelling Does complain that mouth is sore.   Review of Systems Constitutional:   No  weight loss, night sweats,  Fevers, chills, fatigue, or  lassitude.  HEENT:   No headaches,  Difficulty swallowing,  Tooth/dental problems, or  Sore throat,                No sneezing, itching, ear ache,  +nasal congestion, post nasal drip,   CV:  No chest pain,  Orthopnea, PND, swelling in lower extremities, anasarca, dizziness, palpitations, syncope.   GI  No heartburn, indigestion, abdominal pain, nausea, vomiting, diarrhea, change in bowel habits, loss of appetite, bloody stools.   Resp:    No chest wall  deformity  Skin: no rash or lesions.  GU: no dysuria, change in color of urine, no urgency or frequency.  No flank pain, no hematuria   MS:  No joint pain or swelling.  No decreased range of motion.  No back pain.  Psych:  No change in mood or affect. No depression or anxiety.  No memory loss.         Objective:   Physical Exam GEN: A/Ox3; pleasant , NAD, well nourished   HEENT:  [place]/AT,  EACs-clear, TMs-wnl, NOSE-clear, THROAT-scattered white patches   NECK:  Supple w/ fair ROM; no JVD; normal carotid impulses w/o bruits; no thyromegaly or nodules palpated; no lymphadenopathy.  RESP  Clear  P & A; w/o, wheezes/ rales/ or rhonchi.no accessory muscle use, no dullness to percussion  CARD:  RRR, no m/r/g  , no peripheral edema, pulses intact, no cyanosis or clubbing.  GI:   Soft & nt; nml bowel sounds; no organomegaly or masses detected.  Musco: Warm bil, no deformities or joint swelling noted.   Neuro: alert, no focal deficits noted.    Skin: Warm, no lesions or rashes         Assessment & Plan:

## 2013-10-11 NOTE — Patient Instructions (Signed)
Finish Azithromycin.  Rinse after inhalers .  Mycelex troches 5 times daily .  Follow up Dr. Lake Bells next week with PFT .  Please contact office for sooner follow up if symptoms do not improve or worsen or seek emergency care

## 2013-10-15 ENCOUNTER — Telehealth: Payer: Self-pay | Admitting: Internal Medicine

## 2013-10-15 NOTE — Telephone Encounter (Signed)
Spoke with patient and she was calling about her husband and need for screening colon. She will get his MD to refer to [place] for this.

## 2013-10-15 NOTE — Telephone Encounter (Signed)
Left a message for patient to call back. 

## 2013-10-16 NOTE — Assessment & Plan Note (Signed)
?  COPD flare -improving  Will need PFT  Thrush on exam   Plan   Finish Azithromycin.  Rinse after inhalers .  Mycelex troches 5 times daily .  Follow up Dr. Lake Bells next week with PFT .  Please contact office for sooner follow up if symptoms do not improve or worsen or seek emergency care

## 2013-10-18 ENCOUNTER — Encounter (HOSPITAL_COMMUNITY): Payer: Commercial Managed Care - PPO

## 2013-10-18 ENCOUNTER — Ambulatory Visit: Payer: Commercial Managed Care - PPO | Admitting: Pulmonary Disease

## 2013-10-23 ENCOUNTER — Ambulatory Visit: Payer: BC Managed Care – PPO | Admitting: Internal Medicine

## 2013-10-23 ENCOUNTER — Encounter: Payer: Self-pay | Admitting: Internal Medicine

## 2013-10-30 ENCOUNTER — Ambulatory Visit (HOSPITAL_COMMUNITY)
Admission: RE | Admit: 2013-10-30 | Discharge: 2013-10-30 | Disposition: A | Discharge status: Home / Self Care | Payer: Commercial Managed Care - PPO | Source: Ambulatory Visit | Attending: Pulmonary Disease | Admitting: Pulmonary Disease

## 2013-10-30 ENCOUNTER — Ambulatory Visit (INDEPENDENT_AMBULATORY_CARE_PROVIDER_SITE_OTHER): Payer: Commercial Managed Care - PPO | Admitting: Pulmonary Disease

## 2013-10-30 ENCOUNTER — Encounter: Payer: Self-pay | Admitting: Pulmonary Disease

## 2013-10-30 VITALS — BP 126/74 | HR 74 | Ht 66.5 in | Wt 178.0 lb

## 2013-10-30 DIAGNOSIS — R059 Cough, unspecified: Secondary | ICD-10-CM | POA: Insufficient documentation

## 2013-10-30 DIAGNOSIS — J988 Other specified respiratory disorders: Secondary | ICD-10-CM | POA: Insufficient documentation

## 2013-10-30 DIAGNOSIS — R05 Cough: Secondary | ICD-10-CM

## 2013-10-30 DIAGNOSIS — J45909 Unspecified asthma, uncomplicated: Secondary | ICD-10-CM | POA: Insufficient documentation

## 2013-10-30 DIAGNOSIS — R0602 Shortness of breath: Secondary | ICD-10-CM | POA: Insufficient documentation

## 2013-10-30 DIAGNOSIS — Z87891 Personal history of nicotine dependence: Secondary | ICD-10-CM | POA: Insufficient documentation

## 2013-10-30 DIAGNOSIS — J4520 Mild intermittent asthma, uncomplicated: Secondary | ICD-10-CM

## 2013-10-30 DIAGNOSIS — G43909 Migraine, unspecified, not intractable, without status migrainosus: Secondary | ICD-10-CM

## 2013-10-30 DIAGNOSIS — Z23 Encounter for immunization: Secondary | ICD-10-CM

## 2013-10-30 MED ORDER — FLUTICASONE-SALMETEROL 115-21 MCG/ACT IN AERO: 2.0000 | INHALATION_SPRAY | Freq: Two times a day (BID) | RESPIRATORY_TRACT | Status: DC

## 2013-10-30 MED ORDER — LEVALBUTEROL HCL 0.63 MG/3ML IN NEBU
0.6300 mg | INHALATION_SOLUTION | Freq: Once | RESPIRATORY_TRACT | Status: AC
  Administered 2013-10-30: 0.63 mg via RESPIRATORY_TRACT
  Filled 2013-10-30: qty 3

## 2013-10-30 MED ORDER — TOPIRAMATE 100 MG PO TABS: 100.0000 mg | ORAL_TABLET | Freq: Two times a day (BID) | ORAL | Status: DC

## 2013-10-30 MED ORDER — AEROCHAMBER MV MISC
Status: DC
Start: 2013-10-30 — End: 2019-08-02

## 2013-10-30 MED ORDER — LEVALBUTEROL HCL 0.63 MG/3ML IN NEBU: 0.6300 mg | INHALATION_SOLUTION | RESPIRATORY_TRACT | Status: DC | PRN

## 2013-10-30 NOTE — Progress Notes (Signed)
Subjective:    Patient ID: Lisa Ryan, female    DOB: 1975/12/20, 38 y.o.   MRN: [id number]  Synopsis: Lisa Ryan has asthma versus early COPD. She smoked 2 packs of cigarettes daily for 20 years and quit in 2013. She was hospitalized in 2015 for an exacerbation of COPD or asthma with heavy cough. She had lung function tests in 2015 which showed: 10/30/2013 full pulmonary function test> ratio 79% FEV1 2.1 L (87% predicted, 15% change with bronchodilator), total lung capacity 4.96 L (92% predicted), DLCO 22.88 (84% predicted).HPI  10/30/2013>  Lisa Ryan tells me that her cough has resolved since the last visit. She continues to have some dyspnea on exertion. She uses Advair twice a day and has used Xopenex about that often as well. She does not use the Xopenex on an as-needed basis. She only uses twice a day as a rule. She continues to have occasional chest tightness. She sometimes has some wheezing. She notes that the Xopenex really helps when she does take it. She also has noted that she has had some tingling in her hands since running out of her Topamax and she is asking me for a refill today. She has not been to see her primary care doctor recently.  Past Medical History  Diagnosis Date  . Anxiety state, unspecified 10/12/2007  . CHEST PAIN, ATYPICAL 02/18/2010  . CHEST PAIN-PRECORDIAL 02/19/2010  . FATIGUE 09/28/2007  . GASTROENTERITIS 10/03/2008  . MIGRAINE HEADACHE 03/04/2010  . PEDAL EDEMA 02/18/2010  . PEPTIC ULCER DISEASE 09/28/2007  . 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 SEIZURE DISORDER 09/28/2007  . WEIGHT GAIN 09/28/2007  . Tobacco abuse   . GERD (gastroesophageal reflux disease)   . CHF (congestive heart failure)     During Pregnancy     Review of Systems  Constitutional: Negative for fever, chills and fatigue.  HENT: Negative for postnasal drip, rhinorrhea and sinus pressure.   Respiratory: Positive for shortness of breath. Negative for cough  and wheezing.   Cardiovascular: Negative for chest pain, palpitations and leg swelling.       Objective:   Physical Exam Filed Vitals:   10/30/13 1639  BP: 126/74  Pulse: 74  Height: 5' 6.5" (1.689 m)  Weight: 178 lb (80.74 kg)  SpO2: 98%    RA  Gen: well appearing, no acute distress HEENT: NCAT, OP clear PULM: CTA B CV: RRR, no mgr, no JVD AB: BS+, soft, nontender, no hsm Ext: warm, no edema, no clubbing, no cyanosis Derm: no rash or skin breakdown       Assessment & Plan:   Cough Resolved.  Intrinsic asthma Her lung function test today demonstrated airflow obstruction which was reversible with a bronchodilator. These lung function test today were not consistent with COPD. However, I explained to her that this is most consistent with asthma, but given her lengthy smoking history it would not surprise me if her lung function tests in 10 years were consistent with COPD.  So the best approach at this point is to treat her as asthma with the understanding that she should never smoke again.  Plan: -Continue Advair -HFA teaching provided today -Use a spacer -Flu shot every year -Pneumonia shot today -Followup with me in 6 months    MIGRAINE HEADACHE 14 days of Topamax renewed today. I explained to her that I do not routinely prescribe this medicine and will not prescribe any more. She needs to follow up with her primary care physician to get  more.    Updated Medication List Outpatient Encounter Prescriptions as of 10/30/2013  Medication Sig  . fluticasone-salmeterol (ADVAIR HFA) 115-21 MCG/ACT inhaler Inhale 2 puffs into the lungs 2 (two) times daily.  Marland Kitchen levalbuterol (XOPENEX) 0.63 MG/3ML nebulizer solution Take 3 mLs (0.63 mg total) by nebulization every 4 (four) hours as needed for wheezing or shortness of breath.  . pantoprazole (PROTONIX) 40 MG tablet Take 1 tablet (40 mg total) by mouth 2 (two) times daily.  Marland Kitchen topiramate (TOPAMAX) 100 MG tablet Take 1 tablet  (100 mg total) by mouth 2 (two) times daily.  . [DISCONTINUED] fluticasone-salmeterol (ADVAIR HFA) 115-21 MCG/ACT inhaler Inhale 2 puffs into the lungs 2 (two) times daily.  . [DISCONTINUED] levalbuterol (XOPENEX) 0.63 MG/3ML nebulizer solution Take 3 mLs (0.63 mg total) by nebulization every 4 (four) hours as needed for wheezing or shortness of breath.  . [DISCONTINUED] topiramate (TOPAMAX) 100 MG tablet Take 1 tablet (100 mg total) by mouth 2 (two) times daily.  Marland Kitchen Spacer/Aero-Holding Chambers (AEROCHAMBER MV) inhaler Use as instructed  . [DISCONTINUED] azithromycin (ZITHROMAX) 250 MG tablet Take once daily for 3 more days.  . [DISCONTINUED] benzonatate (TESSALON) 100 MG capsule Take 100 mg by mouth 3 (three) times daily as needed for cough.  . [DISCONTINUED] chlorpheniramine-HYDROcodone (TUSSIONEX) 10-8 MG/5ML LQCR Take 5 mLs by mouth every 12 (twelve) hours.  . [DISCONTINUED] clonazePAM (KLONOPIN) 0.5 MG tablet Take 1 tablet (0.5 mg total) by mouth 2 (two) times daily as needed for anxiety.  . [DISCONTINUED] clotrimazole (MYCELEX) 10 MG troche Take 1 tablet (10 mg total) by mouth 5 (five) times daily.  . [DISCONTINUED] fluticasone (FLONASE) 50 MCG/ACT nasal spray Place 2 sprays into both nostrils daily.  . [DISCONTINUED] predniSONE (DELTASONE) 20 MG tablet Take 1 tablet (20 mg total) by mouth daily with breakfast. Take 60 mg with breakfast on 6/10, 6/11 Take 40 mg with breakfast on 6/12 - 6/14 Take 20 mg with breakfast on 6/15 - 6/17.  Then stop  . [DISCONTINUED] traMADol (ULTRAM) 50 MG tablet Take 1 tablet (50 mg total) by mouth every 12 (twelve) hours as needed for moderate pain.

## 2013-10-30 NOTE — Assessment & Plan Note (Addendum)
Her lung function test today demonstrated airflow obstruction which was reversible with a bronchodilator. These lung function test today were not consistent with COPD. However, I explained to her that this is most consistent with asthma, but given her lengthy smoking history it would not surprise me if her lung function tests in 10 years were consistent with COPD.  So the best approach at this point is to treat her as asthma with the understanding that she should never smoke again.  Plan: -Continue Advair -HFA teaching provided today -Use a spacer -Flu shot every year -Pneumonia shot today -Followup with me in 6 months

## 2013-10-30 NOTE — Patient Instructions (Signed)
Take the Advair 2 puffs twice a day with a spacer Use xopenex only as needed for shortness of breath or wheezing (up to every four hours), if you need to use it more than every four hours then you need to see [place] or your doctor Stay active and exercise regularly Get a flu shot in the fall See your primary care doctor about the topamax We will see you back in 6 months or sooner if needed

## 2013-10-30 NOTE — Assessment & Plan Note (Signed)
Resolved

## 2013-10-30 NOTE — Assessment & Plan Note (Signed)
14 days of Topamax renewed today. I explained to her that I do not routinely prescribe this medicine and will not prescribe any more. She needs to follow up with her primary care physician to get more.

## 2013-11-04 LAB — PULMONARY FUNCTION TEST
DL/VA % pred: 98 %
DL/VA: 4.95 ml/min/mmHg/L
DLCO UNC: 22.88 ml/min/mmHg
DLCO cor % pred: 84 %
DLCO cor: 22.88 ml/min/mmHg
DLCO unc % pred: 84 %
FEF 25-75 POST: 2.78 L/s
FEF 25-75 Pre: 2.02 L/sec
FEF2575-%Change-Post: 37 %
FEF2575-%Pred-Post: 85 %
FEF2575-%Pred-Pre: 62 %
FEV1-%Change-Post: 16 %
FEV1-%PRED-POST: 87 %
FEV1-%Pred-Pre: 75 %
FEV1-Post: 2.81 L
FEV1-Pre: 2.41 L
FEV1FVC-%Change-Post: 10 %
FEV1FVC-%Pred-Pre: 87 %
FEV6-%Change-Post: 5 %
FEV6-%PRED-PRE: 86 %
FEV6-%Pred-Post: 91 %
FEV6-Post: 3.53 L
FEV6-Pre: 3.36 L
FEV6FVC-%CHANGE-POST: 0 %
FEV6FVC-%PRED-POST: 101 %
FEV6FVC-%Pred-Pre: 102 %
FVC-%CHANGE-POST: 5 %
FVC-%PRED-POST: 89 %
FVC-%Pred-Pre: 85 %
FVC-PRE: 3.36 L
FVC-Post: 3.54 L
PRE FEV1/FVC RATIO: 72 %
PRE FEV6/FVC RATIO: 100 %
Post FEV1/FVC ratio: 79 %
Post FEV6/FVC ratio: 100 %
RV % PRED: 117 %
RV: 1.99 L
TLC % PRED: 92 %
TLC: 4.96 L

## 2013-12-24 ENCOUNTER — Telehealth: Payer: Self-pay | Admitting: Internal Medicine

## 2013-12-24 ENCOUNTER — Telehealth: Payer: Self-pay | Admitting: Pulmonary Disease

## 2013-12-24 DIAGNOSIS — R7611 Nonspecific reaction to tuberculin skin test without active tuberculosis: Secondary | ICD-10-CM | POA: Insufficient documentation

## 2013-12-24 NOTE — Telephone Encounter (Signed)
Pt states that she recently got a job offer to work with [employer] and they sent her to have her pre-employment doctor visit. Pt received titers on vaccine as well as a TB test at a [facility]. Pt states that she had TB test placed and missed the 72 hour window on having this read d/t not getting to the [facility] office before they closed. Pt states that she then went to [facility] to have another TB test placed and the Doctor there refused to place it as her current test was reading positive. Per pt, she was told that her test site was showing a positive result (63mm wheal). Pt was advised to have a chest xray to confirm. Pt states that she had a CXR 10/07/13 and was wanting to know if this would suffice in showing whether or not she has TB, or does she need a repeat CXR.  I advised the patient that we would speak with Dr Lake Bells and see if he feels this needs to be repeated.  Pt states that she needs a letter after CXR stating that she is cleared to start her new job and is not contagious.   Please advise Dr Lake Bells. Thanks.

## 2013-12-24 NOTE — Telephone Encounter (Signed)
Pt states her tb test from [facility] was pos. Pt is starting new position w/ [employer] and needs order for chest xray asap. (today). Pt states she had one in June 2015 and want to know will that be ok for her to have a clearance from you for her tb or does she need another xray? Pt wants a cb asap please

## 2013-12-24 NOTE — Telephone Encounter (Signed)
Since it has been two months she needs a new chest x-ray

## 2013-12-24 NOTE — Telephone Encounter (Signed)
Called and spoke with pt and she is aware that its ok to come in for cxr.  Order has already been placed.

## 2013-12-25 ENCOUNTER — Telehealth: Payer: Self-pay | Admitting: Pulmonary Disease

## 2013-12-25 ENCOUNTER — Ambulatory Visit (INDEPENDENT_AMBULATORY_CARE_PROVIDER_SITE_OTHER)
Admission: RE | Admit: 2013-12-25 | Discharge: 2013-12-25 | Disposition: A | Discharge status: Home / Self Care | Payer: Commercial Managed Care - PPO | Source: Ambulatory Visit | Attending: Pulmonary Disease | Admitting: Pulmonary Disease

## 2013-12-25 DIAGNOSIS — R7611 Nonspecific reaction to tuberculin skin test without active tuberculosis: Secondary | ICD-10-CM

## 2013-12-25 NOTE — Telephone Encounter (Signed)
Patient needing results to be able to go back to work.  [phone]

## 2013-12-25 NOTE — Telephone Encounter (Signed)
Pt aware. She requests results be faxed to [place]: Janetris at [phone]. Results faxed. [place] Bing, CMA

## 2013-12-25 NOTE — Telephone Encounter (Signed)
Pt had CXR today and is requesting results to see if it is ok to return to work. Please advise. [place] Bing, CMA

## 2013-12-25 NOTE — Telephone Encounter (Signed)
Pt called back - states she is to be starting a new job with [employer] and can't start until cxr results are received.  She is requesting results TODAY -- assured msg was sent to BQ to address and would resend to ensure he has received this.  BQ, pls advise.  Thank you.

## 2013-12-25 NOTE — Telephone Encounter (Signed)
It was normal

## 2013-12-26 ENCOUNTER — Telehealth: Payer: Self-pay | Admitting: Pulmonary Disease

## 2013-12-26 NOTE — Telephone Encounter (Signed)
Spoke with pt - requesting cxr results to be released to MyChart - this has been done.  Pt aware.  Pt states Janetris did not receive cxr results yesterday.  Requesting results to be faxed again to verified fax #.  This has been done.  Pt aware and voiced no further questions or concerns at this time.

## 2013-12-31 NOTE — Telephone Encounter (Signed)
i have yet to officially see her  So i cant sign anything    unless there are new symptoms of exposures  X ray from June should be ok by my standards.for work situation   hwever she shuld have ov to discuss results  If [facility]nt [facility] .  Has she ever had a positive test before ?  Did she take prophylaxis?

## 2014-04-15 ENCOUNTER — Encounter (HOSPITAL_COMMUNITY): Payer: Self-pay | Admitting: Emergency Medicine

## 2014-04-15 ENCOUNTER — Emergency Department (HOSPITAL_COMMUNITY)
Admission: EM | Admit: 2014-04-15 | Discharge: 2014-04-15 | Disposition: A | Discharge status: Home / Self Care | Payer: Commercial Managed Care - PPO | Attending: Emergency Medicine | Admitting: Emergency Medicine

## 2014-04-15 DIAGNOSIS — R55 Syncope and collapse: Secondary | ICD-10-CM | POA: Insufficient documentation

## 2014-04-15 DIAGNOSIS — G40909 Epilepsy, unspecified, not intractable, without status epilepticus: Secondary | ICD-10-CM | POA: Insufficient documentation

## 2014-04-15 DIAGNOSIS — Z8659 Personal history of other mental and behavioral disorders: Secondary | ICD-10-CM | POA: Insufficient documentation

## 2014-04-15 DIAGNOSIS — R202 Paresthesia of skin: Secondary | ICD-10-CM

## 2014-04-15 DIAGNOSIS — I509 Heart failure, unspecified: Secondary | ICD-10-CM | POA: Insufficient documentation

## 2014-04-15 DIAGNOSIS — Z7951 Long term (current) use of inhaled steroids: Secondary | ICD-10-CM | POA: Insufficient documentation

## 2014-04-15 DIAGNOSIS — K219 Gastro-esophageal reflux disease without esophagitis: Secondary | ICD-10-CM | POA: Insufficient documentation

## 2014-04-15 DIAGNOSIS — G43909 Migraine, unspecified, not intractable, without status migrainosus: Secondary | ICD-10-CM | POA: Insufficient documentation

## 2014-04-15 DIAGNOSIS — R064 Hyperventilation: Secondary | ICD-10-CM | POA: Insufficient documentation

## 2014-04-15 DIAGNOSIS — Z3202 Encounter for pregnancy test, result negative: Secondary | ICD-10-CM | POA: Insufficient documentation

## 2014-04-15 DIAGNOSIS — Z720 Tobacco use: Secondary | ICD-10-CM | POA: Insufficient documentation

## 2014-04-15 DIAGNOSIS — Z8711 Personal history of peptic ulcer disease: Secondary | ICD-10-CM | POA: Insufficient documentation

## 2014-04-15 LAB — CBC WITH DIFFERENTIAL/PLATELET
BASOS ABS: 0 10*3/uL (ref 0.0–0.1)
BASOS PCT: 0 % (ref 0–1)
EOS ABS: 0.3 10*3/uL (ref 0.0–0.7)
Eosinophils Relative: 3 % (ref 0–5)
HCT: 40.6 % (ref 36.0–46.0)
Hemoglobin: 13.8 g/dL (ref 12.0–15.0)
Lymphocytes Relative: 20 % (ref 12–46)
Lymphs Abs: 2 10*3/uL (ref 0.7–4.0)
MCH: 33.1 pg (ref 26.0–34.0)
MCHC: 34 g/dL (ref 30.0–36.0)
MCV: 97.4 fL (ref 78.0–100.0)
MONO ABS: 0.6 10*3/uL (ref 0.1–1.0)
Monocytes Relative: 6 % (ref 3–12)
NEUTROS PCT: 71 % (ref 43–77)
Neutro Abs: 6.9 10*3/uL (ref 1.7–7.7)
Platelets: 331 10*3/uL (ref 150–400)
RBC: 4.17 MIL/uL (ref 3.87–5.11)
RDW: 12.7 % (ref 11.5–15.5)
WBC: 9.9 10*3/uL (ref 4.0–10.5)

## 2014-04-15 LAB — URINE MICROSCOPIC-ADD ON

## 2014-04-15 LAB — COMPREHENSIVE METABOLIC PANEL
ALT: 21 U/L (ref 0–35)
ANION GAP: 13 (ref 5–15)
AST: 13 U/L (ref 0–37)
Albumin: 3.8 g/dL (ref 3.5–5.2)
Alkaline Phosphatase: 93 U/L (ref 39–117)
BILIRUBIN TOTAL: 0.3 mg/dL (ref 0.3–1.2)
BUN: 10 mg/dL (ref 6–23)
CHLORIDE: 105 meq/L (ref 96–112)
CO2: 24 meq/L (ref 19–32)
CREATININE: 0.63 mg/dL (ref 0.50–1.10)
Calcium: 9.2 mg/dL (ref 8.4–10.5)
GLUCOSE: 93 mg/dL (ref 70–99)
Potassium: 4 mEq/L (ref 3.7–5.3)
Sodium: 142 mEq/L (ref 137–147)
Total Protein: 6.8 g/dL (ref 6.0–8.3)

## 2014-04-15 LAB — D-DIMER, QUANTITATIVE: D-Dimer, Quant: <0.27 ug/mL-FEU (ref 0.00–0.48)

## 2014-04-15 LAB — TROPONIN I: Troponin I: <0.3 ng/mL (ref <0.30)

## 2014-04-15 LAB — TSH: TSH: 1.21 u[IU]/mL (ref 0.350–4.500)

## 2014-04-15 LAB — URINALYSIS, ROUTINE W REFLEX MICROSCOPIC
Bilirubin Urine: NEGATIVE
Glucose, UA: NEGATIVE mg/dL
KETONES UR: NEGATIVE mg/dL
Nitrite: NEGATIVE
PROTEIN: NEGATIVE mg/dL
Specific Gravity, Urine: 1.009 (ref 1.005–1.030)
UROBILINOGEN UA: 0.2 mg/dL (ref 0.0–1.0)
pH: 6 (ref 5.0–8.0)

## 2014-04-15 LAB — PREGNANCY, URINE: Preg Test, Ur: NEGATIVE

## 2014-04-15 MED ORDER — ONDANSETRON HCL 4 MG/2ML IJ SOLN
4.0000 mg | Freq: Once | INTRAMUSCULAR | Status: AC
  Administered 2014-04-15: 4 mg via INTRAVENOUS
  Filled 2014-04-15: qty 2

## 2014-04-15 NOTE — ED Notes (Signed)
Pt states was at work and got "weird feeling all over body, almost like dizziness, with tingling in arms and lips, and flushed feeling followed by nausea". Pt denies passing out. Pt states also woke up with neck and left shoulder pain since yesterday.

## 2014-04-15 NOTE — Discharge Instructions (Signed)
Paresthesia Paresthesia is an abnormal burning or prickling sensation. This sensation is generally felt in the hands, arms, legs, or feet. However, it may occur in any part of the body. It is usually not painful. The feeling may be described as:  Tingling or numbness.  "Pins and needles."  Skin crawling.  Buzzing.  Limbs "falling asleep."  Itching. Most people experience temporary (transient) paresthesia at some time in their lives. CAUSES  Paresthesia may occur when you breathe too quickly (hyperventilation). It can also occur without any apparent cause. Commonly, paresthesia occurs when pressure is placed on a nerve. The feeling quickly goes away once the pressure is removed. For some people, however, paresthesia is a long-lasting (chronic) condition caused by an underlying disorder. The underlying disorder may be:  A traumatic, direct injury to nerves. Examples include a:  Broken (fractured) neck.  Fractured skull.  A disorder affecting the brain and spinal cord (central nervous system). Examples include:  Transverse myelitis.  Encephalitis.  Transient ischemic attack.  Multiple sclerosis.  Stroke.  Tumor or blood vessel problems, such as an arteriovenous malformation pressing against the brain or spinal cord.  A condition that damages the peripheral nerves (peripheral neuropathy). Peripheral nerves are not part of the brain and spinal cord. These conditions include:  Diabetes.  Peripheral vascular disease.  Nerve entrapment syndromes, such as carpal tunnel syndrome.  Shingles.  Hypothyroidism.  Vitamin B12 deficiencies.  Alcoholism.  Heavy metal poisoning (lead, arsenic).  Rheumatoid arthritis.  Systemic lupus erythematosus. DIAGNOSIS  Your caregiver will attempt to find the underlying cause of your paresthesia. Your caregiver may:  Take your medical history.  Perform a physical exam.  Order various lab tests.  Order imaging tests. TREATMENT    Treatment for paresthesia depends on the underlying cause. HOME CARE INSTRUCTIONS  Avoid drinking alcohol.  You may consider massage or acupuncture to help relieve your symptoms.  Keep all follow-up appointments as directed by your caregiver. SEEK IMMEDIATE MEDICAL CARE IF:   You feel weak.  You have trouble walking or moving.  You have problems with speech or vision.  You feel confused.  You cannot control your bladder or bowel movements.  You feel numbness after an injury.  You faint.  Your burning or prickling feeling gets worse when walking.  You have pain, cramps, or dizziness.  You develop a rash. MAKE SURE YOU:  Understand these instructions.  Will watch your condition.  Will get help right away if you are not doing well or get worse. Document Released: 04/09/2002 Document Revised: 07/12/2011 Document Reviewed: 01/08/2011 [facility] Patient Information 2015 [place], [place]. This information is not intended to replace advice given to you by your health care provider. Make sure you discuss any questions you have with your health care provider. Near-Syncope Near-syncope (commonly known as near fainting) is sudden weakness, dizziness, or feeling like you might pass out. During an episode of near-syncope, you may also develop pale skin, have tunnel vision, or feel sick to your stomach (nauseous). Near-syncope may occur when getting up after sitting or while standing for a long time. It is caused by a sudden decrease in blood flow to the brain. This decrease can result from various causes or triggers, most of which are not serious. However, because near-syncope can sometimes be a sign of something serious, a medical evaluation is required. The specific cause is often not determined. HOME CARE INSTRUCTIONS  Monitor your condition for any changes. The following actions may help to alleviate any discomfort you  are experiencing:  Have someone stay with you until you feel  stable.  Lie down right away and prop your feet up if you start feeling like you might faint. Breathe deeply and steadily. Wait until all the symptoms have passed. Most of these episodes last only a few minutes. You may feel tired for several hours.   Drink enough fluids to keep your urine clear or pale yellow.   If you are taking blood pressure or heart medicine, get up slowly when seated or lying down. Take several minutes to sit and then stand. This can reduce dizziness.  Follow up with your health care provider as directed. SEEK IMMEDIATE MEDICAL CARE IF:   You have a severe headache.   You have unusual pain in the chest, abdomen, or back.   You are bleeding from the mouth or rectum, or you have black or tarry stool.   You have an irregular or very fast heartbeat.   You have repeated fainting or have seizure-like jerking during an episode.   You faint when sitting or lying down.   You have confusion.   You have difficulty walking.   You have severe weakness.   You have vision problems.  MAKE SURE YOU:   Understand these instructions.  Will watch your condition.  Will get help right away if you are not doing well or get worse. Document Released: 04/19/2005 Document Revised: 04/24/2013 Document Reviewed: 09/22/2012 [facility] Patient Information 2015 [place], [place]. This information is not intended to replace advice given to you by your health care provider. Make sure you discuss any questions you have with your health care provider.  Emergency Department Resource Guide 1) Find a Doctor and Pay Out of Pocket Although you won't have to find out who is covered by your insurance plan, it is a good idea to ask around and get recommendations. You will then need to call the office and see if the doctor you have chosen will accept you as a new patient and what types of options they offer for patients who are self-pay. Some doctors offer discounts or will set up  payment plans for their patients who do not have insurance, but you will need to ask so you aren't surprised when you get to your appointment.  2) Contact Your Local Health Department Not all health departments have doctors that can see patients for sick visits, but many do, so it is worth a call to see if yours does. If you don't know where your local health department is, you can check in your phone book. The CDC also has a tool to help you locate your state's health department, and many state websites also have listings of all of their local health departments.  3) Find a [facility] If your illness is not likely to be very severe or complicated, you may want to try a walk in clinic. These are popping up all over the country in pharmacies, drugstores, and shopping centers. They're usually staffed by nurse practitioners or physician assistants that have been trained to treat common illnesses and complaints. They're usually fairly quick and inexpensive. However, if you have serious medical issues or chronic medical problems, these are probably not your best option.  No Primary Care Doctor: - Call Health Connect at  [phone] - they can help you locate a primary care doctor that  accepts your insurance, provides certain services, etc. - Physician Referral Service- [phone]  Chronic Pain Problems: Organization  Address  Phone   Notes  [facility]  [phone] Patients need to be referred by their primary care doctor.   Medication Assistance: Organization         Address  Phone   Notes  [facility] [place], [place] [address] [phone] --Must be a resident of [facility] -- Must have NO insurance coverage whatsoever (no Medicaid/ Medicare, etc.) -- The pt. MUST have a primary care doctor that directs their care regularly and follows them in the community   [facility]  [phone]   [employer]  [phone]    Agencies that provide inexpensive medical care: Organization         Address  Phone   Notes  [place]  [phone]   [facility]    [phone]   [facility] [place], [place] [address] [phone]   [place] [address] [place] [phone]   [facility]anned Parenthood    [phone]   [facility]    [phone]   [place] and [address], [place] Phone:  [phone], Fax:  [phone] Hours of Operation:  9 am - 6 pm, M-F.  Also accepts Medicaid/Medicare and self-pay.  [facility] for [place] [address], [address], [place] Phone: [phone], Fax: [phone]. Hours of Operation:  8:30 am - 5:30 pm, M-F.  Also accepts Medicaid and self-pay.  [facility] [place] [address], [place] Phone: [phone]   [place], [place], [place] [phone], Ext. [phone] Mondays & Thursdays: 7-9 AM.  First 15 patients are seen on a first come, first serve basis.    [place] Providers:  Organization         Address  Phone   Notes  [facility] [address], [place] [phone] Also accepts self-pay patients.  [facility] [address], [place]  [phone]   [place], [address], [place] [phone]   [facility] [address], [place] [phone]   Lucianne Lei [address], [place]   [phone] Only accepts [place] Access [place] patients after they have their name applied to their card.   Self-Pay (no insurance) in [facility]:  Organization         Address  Phone   Notes  Sickle Cell Patients, [facility] [place]  [phone]   [facility] [place] [phone]   [facility] [place]  [place], [place],  [phone]   [facility]/Dr. Osei-Bonsu  [address] [place] or [address], [place] [phone] Phone number for both [place] and [place] locations is the same.  Urgent Medical and [facility] [address], [place] [phone]   [facility] [address], [place] or [address] [phone]   [facility] [address], [place] [phone], phone; [phone], fax Sees patients 1st and 3rd Saturday of every month.  Must not qualify  for public or private insurance (i.e. Medicaid, Medicare, [place] Health Choice, Veterans' Benefits)  Household income should be no more than 200% of the poverty level The clinic cannot treat you if you are pregnant or think you are pregnant  Sexually transmitted diseases are not treated at the clinic.    Dental Care: Organization         Address  Phone  Notes  [facility] Department of [place] [facility] [place] [phone] Accepts children up to age 42 who are enrolled in [place] or [place]; pregnant women with a Medicaid card; and children who have applied for Medicaid or [place] Health Choice, but were declined, whose parents can pay a reduced fee at time of service.  [facility] of [facility]  [address], [place] [phone] Accepts children up to age 21 who are enrolled in [place] or [place]; pregnant women with a Medicaid card; and children who have applied for Medicaid or [place] Health Choice, but were declined, whose parents can pay a reduced fee at time of service.  [place] [facility]  [place] [phone] Patients  are seen by appointment only. Walk-ins are not accepted. [place] will see patients 56 years of age and older. Monday - Tuesday (8am-5pm) Most Wednesdays (8:30-5pm) $30 per visit, cash only  [facility] Adult Dental Access PRO[facility]  [address], [facility] [phone] Patients are seen by appointment only. Walk-ins are not accepted. [place] will see patients 62 years of age and older. One Wednesday Evening (Monthly: Volunteer Based).  $30 per visit, cash only  [place]  [phone] for adults; Children under age 104, call Graduate Pediatric Dentistry at [phone]. Children aged 29-14, please call [phone] to request a pediatric application.  Dental services are provided in all areas of dental care including fillings, crowns and bridges, complete and partial dentures, implants, gum treatment, root canals, and extractions. Preventive care is also provided. Treatment is provided to both adults and children. Patients are selected via a lottery and there is often a waiting list.   [facility] [address] [place]  [phone] [email or web address]   [facility] [address] [place], [place] [phone], Ext. [phone] Second and Fourth Thursday of each month, opens at 6:30 AM; Clinic ends at 9 AM.  Patients are seen on a first-come first-served basis, and a limited number are seen during each clinic.   [facility]  [address] [place], [place] [phone]   Eligibility Requirements You must have lived in [place], [place], or [place] counties for at least the last three months.   You cannot be eligible for state or federal sponsored [employer], including [employer], [place], or [employer].   You generally cannot be eligible for healthcare insurance through your employer.    How to apply: Eligibility screenings are held every Tuesday and Wednesday afternoon from 1:00 pm until  4:00 pm. You do not need an appointment for the interview!  [facility] [address], [place], [place]   [place]  [place] Department  [place]  [phone]    Behavioral Health Resources in the Community: Intensive Outpatient Programs Organization         Address  Phone  Notes  °[facility] [address], [place], [place] [phone]   °[facility] [address], [place], [place] [phone]   °[facility] [address], [place], [place] ° 336-882-2125   °[facility] [address],  °[place], [place] 1-800-853-5163 or 336-641-4981   °Substance Abuse Resources °Organization         Address  Phone  Notes  °[facility]  336-882-2125   °[facility]  336-784-9470   °[facility]  336-285-9073   °[facility]  336-845-3988   °Residential & Outpatient Substance Abuse Program  1-800-659-3381   °Psychological Services °Organization         Address  Phone  Notes  °[facility]  336- [phone]   °[facility]  336- 378-7881   °[facility] [address], [place] 1-800-853-5163 or 336-641-4981   ° °Mobile Crisis Teams °Organization         Address  Phone  Notes  °[facility], Mobile Crisis Care Unit  1-877-626-1772   °Assertive °Psychotherapeutic Services ° [address] [place], [place] 336-834-9664   °Sharon DeEsch [address] °[place] 336-554-5454   ° °Self-Help/Support Groups °Organization         Address  Phone             Notes  °[facility] [place] - variety of support groups  336- 373-1402 Call for more information  °Narcotics Anonymous (NA), [facility] [address], °[place]  2 meetings at this location  ° °Residential Treatment Programs °Organization          Address  Phone  Notes  °[facility] [address],    °[place]  1-866-801-8205   °[facility] ° [address], [place], [place] [phone]   °[facility] [address], [place] 336-845-3988 Admissions: 8am-3pm M-F  °[facility] [address]    °[place], [place] 336-841-1104   °[facility] [address], [place], [place] 336-379-7146   °[facility] [address]  °[place], [place] 336-285-9073   °[facility] - Intensive Outpatient [address], [place], [place] 336-852-3033   °[facility] ([facility] [address]  °[place], [place] 1-877-615-2722 or 336-784-9470   °[facility] ([facility]) [address] [place], [place] 336-227-7417 Accepts Medicaid  °[facility] [address]  °[place] 1-800-659-3381 Substance Abuse/Addiction Treatment  ° °[place] Behavioral Health Resources °Organization         Address  Phone  Notes  °[facility]  (888) 581-9988   °Julie Brannon, PhD [address] [place], [place]   (336) 349-5553 or (336) 951-0000   °[facility]   [address] °[place], [place] (336) 349-4454   °[facility] [address], [place], [place] (336) 342-8316 Insurance/Medicaid/sponsorship through [facility]  °[facility] [address]                                    [place], [place] ([phone]) [phone] Therapy/tele-psych/case  °[facility] [address]  ° [place],  (336) 349-2233    °Dr. Arfeen  (336) 349-4544   °[facility] [place]  [employer] [place]   [facility]. 1) [address] [place] 2) [place] 3)  [address], [place] [phone]  [phone]   [place] [phone] or [phone] (After Hours)

## 2014-04-15 NOTE — ED Notes (Signed)
Pt received 8mg  Zofran by EMS, PTA. Pt present with neck and shoulder pain yesterday and now is complaining of dizziness, weakness, nausea and numbness/tingling in extremities and lips.

## 2014-04-15 NOTE — ED Provider Notes (Signed)
CSN: [id number]     Arrival date & time 04/15/14  1049 History   First MD Initiated Contact with Patient 04/15/14 1104     Chief Complaint  Patient presents with  . Near Syncope     (Consider location/radiation/quality/duration/timing/severity/associated sxs/prior Treatment) HPI  The patient reports that she was at work when she developed a odd tingling sensation. 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 She started to feel that she was having tingling in her hands and her face particularly around her lower face. She thought it would pass and went on to have a break. She reports that at that point in the break room which was several minutes later she started to feel dizzy and the tingling and weird sensation intensified. She reports she felt nauseated with it as well. She did not have any passing out episode. She denies chest pain or shortness of breath. There is no headache associated with this. No focal weakness or numbness. The patient felt completely well prior to onset of symptoms. She reports she was well all weekend pursuing normal activities without any dyspnea on exertion, chest pain or other symptoms. The patient reports that she woke up this morning feeling like she had a crick in the left side of her neck. It is made worse by turning her head in certain positions and putting pressure on the area.  Past Medical History  Diagnosis Date  . Anxiety state, unspecified 10/12/2007  . CHEST PAIN, ATYPICAL 02/18/2010  . CHEST PAIN-PRECORDIAL 02/19/2010  . FATIGUE 09/28/2007  . GASTROENTERITIS 10/03/2008  . MIGRAINE HEADACHE 03/04/2010  . PEDAL EDEMA 02/18/2010  . PEPTIC ULCER DISEASE 09/28/2007  . 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 SEIZURE DISORDER 09/28/2007  . WEIGHT GAIN 09/28/2007  . Tobacco abuse   . GERD (gastroesophageal reflux disease)   . CHF (congestive heart failure)     During Pregnancy   Past Surgical History   Procedure Laterality Date  . Cholecystectomy    . Appendectomy    . Ankle surgery      left  . Knee arthroscopy      left  . Cesarean section      x3  . Mouth lesion excisional biopsy  04/2011    pre-cancerous, pallet of mouth   Family History  Problem Relation Age of Onset  . Cerebral aneurysm Mother   . Colon cancer Mother   . Heart disease Father   . Diabetes Brother 1    died age 72  . Liver disease Father   . Kidney disease Maternal Grandmother   . Colon cancer Maternal Aunt   . Colon cancer Maternal Grandmother   . Diabetes Sister    History  Substance Use Topics  . Smoking status: Current Every Day Smoker -- 1.00 packs/day for 20 years    Types: Cigarettes  . Smokeless tobacco: Never Used     Comment: Gave pt sheet to quit smoking  . Alcohol Use: No   OB History    No data available     Review of Systems 10 Systems reviewed and are negative for acute change except as noted in the HPI.   Allergies  Albuterol and Morphine  Home Medications   Prior to Admission medications   Medication Sig Start Date End Date Taking? Authorizing Provider  fluticasone-salmeterol (ADVAIR HFA) 115-21 MCG/ACT inhaler Inhale 2 puffs into the lungs 2 (two) times daily. Patient taking differently: Inhale 2 puffs into the lungs  daily as needed (SOB).  10/30/13  Yes Juanito Doom, MD  levalbuterol (XOPENEX) 0.63 MG/3ML nebulizer solution Take 3 mLs (0.63 mg total) by nebulization every 4 (four) hours as needed for wheezing or shortness of breath. Patient not taking: Reported on 04/15/2014 10/30/13   Juanito Doom, MD  pantoprazole (PROTONIX) 40 MG tablet Take 1 tablet (40 mg total) by mouth 2 (two) times daily. Patient not taking: Reported on 04/15/2014 10/09/13   Melton Alar, PA-C  Spacer/Aero-Holding Chambers (AEROCHAMBER MV) inhaler Use as instructed Patient not taking: Reported on 04/15/2014 10/30/13   Juanito Doom, MD  topiramate (TOPAMAX) 100 MG tablet Take 1  tablet (100 mg total) by mouth 2 (two) times daily. Patient not taking: Reported on 04/15/2014 10/30/13   Juanito Doom, MD   BP 104/60 mmHg  Pulse 66  Temp(Src) 98 F (36.7 C) (Oral)  Resp 19  Wt 180 lb (81.647 kg)  SpO2 98% Physical Exam  Constitutional: She is oriented to person, place, and time. She appears well-developed and well-nourished.  HENT:  Head: Normocephalic and atraumatic.  Eyes: EOM are normal. Pupils are equal, round, and reactive to light.  Neck: Neck supple.  Cardiovascular: Normal rate, regular rhythm, normal heart sounds and intact distal pulses.   Pulmonary/Chest: Effort normal and breath sounds normal.  Although at a low level, the patient's breathing pattern is marked by deeper than normal inspiration and then exhaling by blowing out. She has no acute respiratory distress. It is consistent with hyperventilating. No reproduced with chest wall tenderness. However the patient does have reproducible pain to palpation along the left trapezius and left paraspinous muscle bodies.  Abdominal: Soft. Bowel sounds are normal. She exhibits no distension. There is no tenderness.  Musculoskeletal: Normal range of motion. She exhibits no edema.  Neurological: She is alert and oriented to person, place, and time. She has normal strength. Coordination normal. GCS eye subscore is 4. GCS verbal subscore is 5. GCS motor subscore is 6.  Skin: Skin is warm, dry and intact.  Psychiatric: She has a normal mood and affect.    ED Course  Procedures (including critical care time) Labs Review Labs Reviewed  URINALYSIS, ROUTINE W REFLEX MICROSCOPIC - Abnormal; Notable for the following:    APPearance TURBID (*)    Hgb urine dipstick LARGE (*)    Leukocytes, UA SMALL (*)    All other components within normal limits  URINE MICROSCOPIC-ADD ON - Abnormal; Notable for the following:    Squamous Epithelial / LPF FEW (*)    Bacteria, UA MANY (*)    All other components within normal  limits  COMPREHENSIVE METABOLIC PANEL  TROPONIN I  CBC WITH DIFFERENTIAL  PREGNANCY, URINE  TSH  D-DIMER, QUANTITATIVE    Imaging Review No results found.   EKG Interpretation   Date/Time:  Monday April 15 2014 11:33:37 EST Ventricular Rate:  77 PR Interval:  141 QRS Duration: 74 QT Interval:  383 QTC Calculation: 433 R Axis:   71 Text Interpretation:  Sinus rhythm normal Confirmed by Johnney Killian, MD, Jeannie Done  [phone]) on 04/15/2014 1:34:49 PM      MDM   Final diagnoses:  Near syncope  Hyperventilation  Paresthesias   The patient's symptoms are very suggestive of hyperventilation-type syndrome. As I'm examining her although she is not breathing rapidly and having an obvious hyperventilation, she is taking deeper than normal breaths and exhaling. The symptoms did not sound suggestive of an ischemic type of presentation. There was  no chest pain associated or shortness of breath to suggest PE. She has no lower extremity symptoms. Diagnostic workup at this time is within normal limits and vital signs are stable. The patient will be discharged with instructions for signs and symptoms for which return and instructed to follow-up with her family physician this week.    Charlesetta Shanks, MD 04/15/14 [phone]

## 2014-04-15 NOTE — ED Notes (Signed)
MD at bedside. 

## 2014-04-15 NOTE — ED Notes (Signed)
Bed: ES97 Expected date:  Expected time:  Means of arrival:  Comments: Ems- left shoulder pain

## 2015-11-24 ENCOUNTER — Encounter: Payer: Self-pay | Admitting: Obstetrics and Gynecology

## 2015-12-01 ENCOUNTER — Encounter: Payer: Self-pay | Admitting: Obstetrics and Gynecology

## 2015-12-08 ENCOUNTER — Encounter: Payer: Self-pay | Admitting: Obstetrics and Gynecology

## 2015-12-08 ENCOUNTER — Ambulatory Visit (INDEPENDENT_AMBULATORY_CARE_PROVIDER_SITE_OTHER): Payer: PRIVATE HEALTH INSURANCE | Admitting: Obstetrics and Gynecology

## 2015-12-08 VITALS — BP 110/72 | Ht 67.0 in | Wt 172.0 lb

## 2015-12-08 DIAGNOSIS — D251 Intramural leiomyoma of uterus: Secondary | ICD-10-CM | POA: Diagnosis not present

## 2015-12-08 DIAGNOSIS — N939 Abnormal uterine and vaginal bleeding, unspecified: Secondary | ICD-10-CM | POA: Diagnosis not present

## 2015-12-08 MED ORDER — MEGESTROL ACETATE 40 MG PO TABS: 40.0000 mg | ORAL_TABLET | Freq: Three times a day (TID) | ORAL | 2 refills | Status: DC

## 2015-12-08 NOTE — Progress Notes (Signed)
Patient ID: [name], female   DOB: [birth year], 40 y.o.   MRN: [id number]    [facility] Visit  @DATE @            Patient name: Lisa Ryan MRN [id number]  Date of birth: 03-11-1976  Chief Complaint  Patient presents with  . Menorrhagia    irregular periods, heavy periods, mood swings, severe cramping, extreme exhaustion.     CC & HPI:  Lisa Ryan is a 40 y.o. female presenting today for evaluation of abnormal vaginal bleeding and dysmenorrhea. Pt states she skipped a period in May and June, bled last month for 18 days and used 75-100 super+ tampons. She also reports her HGB was 3.7 and was started on iron supplements at this time. She reports that the bleeding stopped for 1.5 weeks and started again 1 week ago. Per pt, prior to two years ago, her periods were regular and her bleeding and dysmenorrhea was not as intense. Pt states her pelvic pain associated with her periods radiates to her legs and her lower back. Pt was referred by her PCP, Dr. Dema Severin, with [facility]. She has not obtained any [place] prior to this visit. She also complains of HA and intense mood swings with her periods.   She also complaints of SUI, which is easily triggered by sneezing, coughing or laughing. She denies difficulty passing bowel movements.   ROS:  Review of Systems  Genitourinary:       +dysmenorrhea, AUB, SUI   Husband, a LAWYER, accompanies pt to visit. Pertinent History Reviewed:   Reviewed: Significant for cesarean section  Medical         Past Medical History:  Diagnosis Date  . Anxiety state, unspecified 10/12/2007  . CHEST PAIN, ATYPICAL 02/18/2010  . CHEST PAIN-PRECORDIAL 02/19/2010  . CHF (congestive heart failure) ([place])    During Pregnancy  . FATIGUE 09/28/2007  . GASTROENTERITIS 10/03/2008  . GERD (gastroesophageal reflux disease)   . MIGRAINE HEADACHE 03/04/2010  . PEDAL EDEMA 02/18/2010  . PEPTIC ULCER DISEASE 09/28/2007  . 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 SEIZURE DISORDER 09/28/2007  . Tobacco abuse   . WEIGHT GAIN 09/28/2007                              Surgical Hx:    Past Surgical History:  Procedure Laterality Date  . ANKLE SURGERY     left  . APPENDECTOMY    . CESAREAN SECTION     x3  . CHOLECYSTECTOMY    . KNEE ARTHROSCOPY     left  . MOUTH LESION EXCISIONAL BIOPSY  04/2011   pre-cancerous, pallet of mouth   Medications: Reviewed & Updated - see associated section                       Current Outpatient Prescriptions:  .  fluticasone-salmeterol (ADVAIR HFA) 115-21 MCG/ACT inhaler, Inhale 2 puffs into the lungs 2 (two) times daily. (Patient taking differently: Inhale 2 puffs into the lungs daily as needed (SOB). ), Disp: 1 Inhaler, Rfl: 5 .  levalbuterol (XOPENEX) 0.63 MG/3ML nebulizer solution, Take 3 mLs (0.63 mg total) by nebulization every 4 (four) hours as needed for wheezing or shortness of breath., Disp: 60 mL, Rfl: 5 .  Spacer/Aero-Holding Chambers (AEROCHAMBER MV) inhaler, Use as instructed, Disp: 1 each, Rfl: 0   Social History: Reviewed -  reports that she  has been smoking Cigarettes.  She has a 20.00 pack-year smoking history. She has never used smokeless tobacco.  Objective Findings:  Vitals: Blood pressure 110/72, height 5\' 7"  (1.702 m), weight 172 lb (78 kg), last menstrual period 12/01/2015.  Physical Examination: General appearance - alert, well appearing, and in no distress Mental status - alert, oriented to person, place, and time Abdomen - soft, nontender, nondistended, no masses or organomegaly Pelvic -  VULVA: normal appearing vulva with no masses, tenderness or lesions,  VAGINA: normal appearing vagina with normal color and discharge, no lesions, excellent posterior support  CERVIX: normal appearing cervix without discharge or lesions, small, adequate support  UTERUS: uterus is normal size, shape, consistency and nontender, small, anterior, well healed scar tissue from previous  c-section  ADNEXA: normal adnexa in size, nontender and no masses Musculoskeletal - no joint tenderness, deformity or swelling Extremities - peripheral pulses normal, no pedal edema, no clubbing or cyanosis Skin - normal coloration and turgor, no rashes, no suspicious skin lesions noted  Discussed with pt risks and benefits of bladder tack and endometrial ablation. At end of discussion, pt had opportunity to ask questions and has no further questions at this time.   Greater than 50% was spent in counseling and coordination of care with the patient. Total time greater than: 15 minutes   Assessment & Plan:   A:  1. AUB, dysmenorrhea, worsening over the last 2 years  2. Ongoing SUI   P:  1. Schedule pelvic/transvaginal US for further evaluation  2. Will rx tid Megace for BTB, to be taken on day 3 of periods  3. Consider endometrial biopsy and ablation if pelvic/transvaginal US is normal       By signing my name below, I, Hansel Feinstein, attest that this documentation has been prepared under the direction and in the presence of Jonnie Kind, MD. Electronically Signed: Hansel Feinstein, ED Scribe. 12/08/15. 4:59 PM.  .I personally performed the services described in this documentation, which was SCRIBED in my presence. The recorded information has been reviewed and considered accurate. It has been edited as necessary during review. Jonnie Kind, MD

## 2015-12-10 ENCOUNTER — Other Ambulatory Visit: Payer: Self-pay | Admitting: Obstetrics and Gynecology

## 2015-12-10 DIAGNOSIS — N939 Abnormal uterine and vaginal bleeding, unspecified: Secondary | ICD-10-CM

## 2015-12-12 ENCOUNTER — Ambulatory Visit (INDEPENDENT_AMBULATORY_CARE_PROVIDER_SITE_OTHER): Payer: PRIVATE HEALTH INSURANCE

## 2015-12-12 ENCOUNTER — Encounter: Payer: Self-pay | Admitting: Obstetrics and Gynecology

## 2015-12-12 ENCOUNTER — Ambulatory Visit (INDEPENDENT_AMBULATORY_CARE_PROVIDER_SITE_OTHER): Payer: PRIVATE HEALTH INSURANCE | Admitting: Obstetrics and Gynecology

## 2015-12-12 VITALS — BP 120/80 | Ht 67.0 in | Wt 171.0 lb

## 2015-12-12 DIAGNOSIS — N921 Excessive and frequent menstruation with irregular cycle: Secondary | ICD-10-CM | POA: Diagnosis not present

## 2015-12-12 DIAGNOSIS — N946 Dysmenorrhea, unspecified: Secondary | ICD-10-CM | POA: Diagnosis not present

## 2015-12-12 DIAGNOSIS — N939 Abnormal uterine and vaginal bleeding, unspecified: Secondary | ICD-10-CM

## 2015-12-12 DIAGNOSIS — N920 Excessive and frequent menstruation with regular cycle: Secondary | ICD-10-CM

## 2015-12-12 NOTE — Progress Notes (Signed)
US PELVIC US TA/TV: Homogenous retroverted uterus wnl,normal ov's bilat (mobile),EEC 11.4 mm,no free fluid,lt adnexal pain during ultrasound

## 2015-12-12 NOTE — Progress Notes (Signed)
Patient ID: [name], female   DOB: 17-Apr-1976, 40 y.o.   MRN: [id number]    [facility] Visit  @DATE @            Patient name: KRISTASIA ENKE MRN [id number]  Date of birth: Jun 19, 1975  CC & HPI:   Chief Complaint  Patient presents with  . Follow-up    possible biopsy      Kassidi Ethelle Lyon is a 40 y.o. female presenting today for f/u of pelvic/transvaginal US results today. Pt states she has had several episodes of AUB along with her ongoing dysmenorrhea over the last 2 years. She denies fever, chills, cough, congestion, SOB, cold or flu symptoms. Pt is a current smoker.   ROS:  Review of Systems  Genitourinary:       +AUB, dysmenorrhea    Pertinent History Reviewed:   Reviewed Medical         Past Medical History:  Diagnosis Date  . Anxiety state, unspecified 10/12/2007  . CHEST PAIN, ATYPICAL 02/18/2010  . CHEST PAIN-PRECORDIAL 02/19/2010  . CHF (congestive heart failure) ([place])    During Pregnancy  . FATIGUE 09/28/2007  . GASTROENTERITIS 10/03/2008  . GERD (gastroesophageal reflux disease)   . MIGRAINE HEADACHE 03/04/2010  . PEDAL EDEMA 02/18/2010  . PEPTIC ULCER DISEASE 09/28/2007  . 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 SEIZURE DISORDER 09/28/2007  . Tobacco abuse   . WEIGHT GAIN 09/28/2007                              Surgical Hx:    Past Surgical History:  Procedure Laterality Date  . ANKLE SURGERY     left  . APPENDECTOMY    . CESAREAN SECTION     x3  . CHOLECYSTECTOMY    . KNEE ARTHROSCOPY     left  . MOUTH LESION EXCISIONAL BIOPSY  04/2011   pre-cancerous, pallet of mouth   Medications: Reviewed & Updated - see associated section                       Current Outpatient Prescriptions:  .  fluticasone-salmeterol (ADVAIR HFA) 115-21 MCG/ACT inhaler, Inhale 2 puffs into the lungs 2 (two) times daily. (Patient taking differently: Inhale 2 puffs into the lungs daily as needed (SOB). ), Disp: 1 Inhaler, Rfl: 5 .   levalbuterol (XOPENEX) 0.63 MG/3ML nebulizer solution, Take 3 mLs (0.63 mg total) by nebulization every 4 (four) hours as needed for wheezing or shortness of breath., Disp: 60 mL, Rfl: 5 .  megestrol (MEGACE) 40 MG tablet, Take 1 tablet (40 mg total) by mouth 3 (three) times daily., Disp: 45 tablet, Rfl: 2 .  Spacer/Aero-Holding Chambers (AEROCHAMBER MV) inhaler, Use as instructed, Disp: 1 each, Rfl: 0   Social History: Reviewed -  reports that she has been smoking Cigarettes.  She has a 20.00 pack-year smoking history. She has never used smokeless tobacco.  Objective Findings:  Vitals: Blood pressure 120/80, height 5\' 7"  (1.702 m), weight 171 lb (77.6 kg), last menstrual period 12/01/2015.  Physical Examination: General appearance - alert, well appearing, and in no distress Mental status - alert, oriented to person, place, and time Neck - supple, no significant adenopathy Chest - deep inspiratory wheezes bilaterally, no rales or rhonchi, symmetric air entry Heart - normal rate, regular rhythm, normal S1, S2, no murmurs, rubs, clicks or gallops Musculoskeletal - no joint tenderness, deformity  or swelling Extremities - peripheral pulses normal, no pedal edema, no clubbing or cyanosis Skin - normal coloration and turgor, no rashes, no suspicious skin lesions noted   Discussed with pt risks and benefits of IUD vs endometrial ablation. Pt states she would like to pursue ablation at this time. Pt states she wants to forgo the endometrial biopsy. Risks of tissue sampling at time of ablation discussed with patient. Patient verbalizes her understanding of risks and would like to proceed. At end of discussion, pt had opportunity to ask questions and has no further questions at this time.   Greater than 50% was spent in counseling and coordination of care with the patient. Total time greater than: 25 minutes   Assessment & Plan:   A:  1. AUB, dysmenorrhea x 2 years  2. Pelvic US showed 11.4 mm  symmetrical endometrium  3. Pt requesting endometrial ablation requests that biopsy be done at time of procedure, declines assessment today  P:  1. D/c Megace, Rx Provera  2. Schedule endometrial ablation today  3. F/u for post op    By signing my name below, I, Hansel Feinstein, attest that this documentation has been prepared under the direction and in the presence of Jonnie Kind, MD. Electronically Signed: Hansel Feinstein, ED Scribe. 12/12/15. 12:28 PM.  I personally performed the services described in this documentation, which was SCRIBED in my presence. The recorded information has been reviewed and considered accurate. It has been edited as necessary during review. Jonnie Kind, MD

## 2015-12-15 ENCOUNTER — Encounter: Payer: Self-pay | Admitting: Obstetrics and Gynecology

## 2015-12-16 MED ORDER — MEDROXYPROGESTERONE ACETATE 5 MG PO TABS: 10.0000 mg | ORAL_TABLET | Freq: Every day | ORAL | 0 refills | Status: DC

## 2015-12-19 NOTE — Patient Instructions (Signed)
Lisa Ryan  12/19/2015     @PREFPERIOPPHARMACY @   Your procedure is scheduled on 12/26/2015 .  Report to [facility] at  615 A.M.  Call this number if you have problems the morning of surgery:  [phone]   Remember:  Do not eat food or drink liquids after midnight.  Take these medicines the morning of surgery with A SIP OF WATER  Take your nebulizer and your inhalers before you come. Bring your rescue inhaler with you .   Do not wear jewelry, make-up or nail polish.  Do not wear lotions, powders, or perfumes.  You may wear deoderant.  Do not shave 48 hours prior to surgery.  Men may shave face and neck.  Do not bring valuables to the hospital.  [facility] is not responsible for any belongings or valuables.  Contacts, dentures or bridgework may not be worn into surgery.  Leave your suitcase in the car.  After surgery it may be brought to your room.  For patients admitted to the hospital, discharge time will be determined by your treatment team.  Patients discharged the day of surgery will not be allowed to drive home.   Name and phone number of your driver:   family Special instructions:  none  Please read over the following fact sheets that you were given. Anesthesia Post-op Instructions and Care and Recovery After Surgery       Endometrial Ablation Endometrial ablation removes the lining of the uterus (endometrium). It is usually a same-day, outpatient treatment. Ablation helps avoid major surgery, such as surgery to remove the cervix and uterus (hysterectomy). After endometrial ablation, you will have little or no menstrual bleeding and may not be able to have children. However, if you are premenopausal, you will need to use a reliable method of birth control following the procedure because of the small chance that pregnancy can occur. There are different reasons to have this procedure. These reasons include:  Heavy periods.  Bleeding  that is causing anemia.  Irregular bleeding.  Bleeding fibroids on the lining inside the uterus if they are smaller than 3 centimeters. This procedure may not be possible for you if:   You want to have children in the future.   You have severe cramps with your menstrual period.   You have precancerous or cancerous cells in your uterus.   You were recently pregnant.   You have gone through menopause.   You have had major surgery on your uterus, resulting in thinning of the uterine wall. Surgeries may include:  The removal of one or more uterine fibroids (myomectomy).  A cesarean section with a classic (vertical) incision on your uterus. Ask your health care provider what type of cesarean you had. Sometimes the scar on your skin is different than the scar on your uterus. Even if you have had surgery on your uterus, certain types of ablation may still be safe for you. Talk with your health care provider. LET [facility] CARE PROVIDER KNOW ABOUT:  Any allergies you have.  All medicines you are taking, including vitamins, herbs, eye drops, creams, and over-the-counter medicines.  Previous problems you or members of your family have had with the use of anesthetics.  Any blood disorders you have.  Previous surgeries you have had.  Medical conditions you have. RISKS AND COMPLICATIONS  Generally, this is a safe procedure. However, as with any procedure, complications can  occur. Possible complications include:  Perforation of the uterus.  Bleeding.  Infection of the uterus, bladder, or vagina.  Injury to surrounding organs.  An air bubble to the lung (air embolus).  Pregnancy following the procedure.  Failure of the procedure to help the problem, requiring hysterectomy.  Decreased ability to diagnose cancer in the lining of the uterus. BEFORE THE PROCEDURE  The lining of the uterus must be tested to make sure there is no pre-cancerous or cancer cells present.  An  ultrasound may be performed to look at the size of the uterus and to check for abnormalities.  Medicines may be given to thin the lining of the uterus. PROCEDURE  During the procedure, your health care provider will use a tool called a resectoscope to help see inside your uterus. There are different ways to remove the lining of your uterus.   Radiofrequency - This method uses a radiofrequency-alternating electric current to remove the lining of the uterus.  Cryotherapy - This method uses extreme cold to freeze the lining of the uterus.  Heated-Free Liquid - This method uses heated salt (saline) solution to remove the lining of the uterus.  Microwave - This method uses high-energy microwaves to heat up the lining of the uterus to remove it.  Thermal balloon - This method involves inserting a catheter with a balloon tip into the uterus. The balloon tip is filled with heated fluid to remove the lining of the uterus. AFTER THE PROCEDURE  After your procedure, do not have sexual intercourse or insert anything into your vagina until permitted by your health care provider. After the procedure, you may experience:  Cramps.  Vaginal discharge.  Frequent urination.   This information is not intended to replace advice given to you by your health care provider. Make sure you discuss any questions you have with your health care provider.   Document Released: 02/27/2004 Document Revised: 01/08/2015 Document Reviewed: 09/20/2012 Elsevier Interactive Patient Education [employer]. Hysteroscopy Hysteroscopy is a procedure used for looking inside the womb (uterus). It may be done for various reasons, including:  To evaluate abnormal bleeding, fibroid (benign, noncancerous) tumors, polyps, scar tissue (adhesions), and possibly cancer of the uterus.  To look for lumps (tumors) and other uterine growths.  To look for causes of why a woman cannot get pregnant (infertility), causes of recurrent  loss of pregnancy (miscarriages), or a lost intrauterine device (IUD).  To perform a sterilization by blocking the fallopian tubes from inside the uterus. In this procedure, a thin, flexible tube with a tiny light and camera on the end of it (hysteroscope) is used to look inside the uterus. A hysteroscopy should be done right after a menstrual period to be sure you are not pregnant. LET [facility] CARE PROVIDER KNOW ABOUT:   Any allergies you have.  All medicines you are taking, including vitamins, herbs, eye drops, creams, and over-the-counter medicines.  Previous problems you or members of your family have had with the use of anesthetics.  Any blood disorders you have.  Previous surgeries you have had.  Medical conditions you have. RISKS AND COMPLICATIONS  Generally, this is a safe procedure. However, as with any procedure, complications can occur. Possible complications include:  Putting a hole in the uterus.  Excessive bleeding.  Infection.  Damage to the cervix.  Injury to other organs.  Allergic reaction to medicines.  Too much fluid used in the uterus for the procedure. BEFORE THE PROCEDURE   Ask your health  care provider about changing or stopping any regular medicines.  Do not take aspirin or blood thinners for 1 week before the procedure, or as directed by your health care provider. These can cause bleeding.  If you smoke, do not smoke for 2 weeks before the procedure.  In some cases, a medicine is placed in the cervix the day before the procedure. This medicine makes the cervix have a larger opening (dilate). This makes it easier for the instrument to be inserted into the uterus during the procedure.  Do not eat or drink anything for at least 8 hours before the surgery.  Arrange for someone to take you home after the procedure. PROCEDURE   You may be given a medicine to relax you (sedative). You may also be given one of the following:  A medicine that  numbs the area around the cervix (local anesthetic).  A medicine that makes you sleep through the procedure (general anesthetic).  The hysteroscope is inserted through the vagina into the uterus. The camera on the hysteroscope sends a picture to a TV screen. This gives the surgeon a good view inside the uterus.  During the procedure, air or a liquid is put into the uterus, which allows the surgeon to see better.  Sometimes, tissue is gently scraped from inside the uterus. These tissue samples are sent to a lab for testing. AFTER THE PROCEDURE   If you had a general anesthetic, you may be groggy for a couple hours after the procedure.  If you had a local anesthetic, you will be able to go home as soon as you are stable and feel ready.  You may have some cramping. This normally lasts for a couple days.  You may have bleeding, which varies from light spotting for a few days to menstrual-like bleeding for 3-7 days. This is normal.  If your test results are not back during the visit, make an appointment with your health care provider to find out the results.   This information is not intended to replace advice given to you by your health care provider. Make sure you discuss any questions you have with your health care provider.   Document Released: 07/26/2000 Document Revised: 02/07/2013 Document Reviewed: 11/16/2012 Elsevier Interactive Patient Education 2016 [place]. Hysteroscopy, Care After Refer to this sheet in the next few weeks. These instructions provide you with information on caring for yourself after your procedure. Your health care provider may also give you more specific instructions. Your treatment has been planned according to current medical practices, but problems sometimes occur. Call your health care provider if you have any problems or questions after your procedure.  WHAT TO EXPECT AFTER THE PROCEDURE After your procedure, it is typical to have the following:  You  may have some cramping. This normally lasts for a couple days.  You may have bleeding. This can vary from light spotting for a few days to menstrual-like bleeding for 3-7 days. HOME CARE INSTRUCTIONS  Rest for the first 1-2 days after the procedure.  Only take over-the-counter or prescription medicines as directed by your health care provider. Do not take aspirin. It can increase the chances of bleeding.  Take showers instead of baths for 2 weeks or as directed by your health care provider.  Do not drive for 24 hours or as directed.  Do not drink alcohol while taking pain medicine.  Do not use tampons, douche, or have sexual intercourse for 2 weeks or until your health care provider  says it is okay.  Take your temperature twice a day for 4-5 days. Write it down each time.  Follow your health care provider's advice about diet, exercise, and lifting.  If you develop constipation, you may:  Take a mild laxative if your health care provider approves.  Add bran foods to your diet.  Drink enough fluids to keep your urine clear or pale yellow.  Try to have someone with you or available to you for the first 24-48 hours, especially if you were given a general anesthetic.  Follow up with your health care provider as directed. SEEK MEDICAL CARE IF:  You feel dizzy or lightheaded.  You feel sick to your stomach (nauseous).  You have abnormal vaginal discharge.  You have a rash.  You have pain that is not controlled with medicine. SEEK IMMEDIATE MEDICAL CARE IF:  You have bleeding that is heavier than a normal menstrual period.  You have a fever.  You have increasing cramps or pain, not controlled with medicine.  You have new belly (abdominal) pain.  You pass out.  You have pain in the tops of your shoulders (shoulder strap areas).  You have shortness of breath.   This information is not intended to replace advice given to you by your health care provider. Make sure you  discuss any questions you have with your health care provider.   Document Released: 02/07/2013 Document Reviewed: 02/07/2013 Elsevier Interactive Patient Education 2016 Elsevier Inc. Dilation and Curettage or Vacuum Curettage Dilation and curettage (D&C) and vacuum curettage are minor procedures. A D&C involves stretching (dilation) the cervix and scraping (curettage) the inside lining of the womb (uterus). During a D&C, tissue is gently scraped from the inside lining of the uterus. During a vacuum curettage, the lining and tissue in the uterus are removed with the use of gentle suction.  Curettage may be performed to either diagnose or treat a problem. As a diagnostic procedure, curettage is performed to examine tissues from the uterus. A diagnostic curettage may be performed for the following symptoms:   Irregular bleeding in the uterus.   Bleeding with the development of clots.   Spotting between menstrual periods.   Prolonged menstrual periods.   Bleeding after menopause.   No menstrual period (amenorrhea).   A change in size and shape of the uterus.  As a treatment procedure, curettage may be performed for the following reasons:   Removal of an IUD (intrauterine device).   Removal of retained placenta after giving birth. Retained placenta can cause an infection or bleeding severe enough to require transfusions.   Abortion.   Miscarriage.   Removal of polyps inside the uterus.   Removal of uncommon types of noncancerous lumps (fibroids).  LET [facility] CARE PROVIDER KNOW ABOUT:   Any allergies you have.   All medicines you are taking, including vitamins, herbs, eye drops, creams, and over-the-counter medicines.   Previous problems you or members of your family have had with the use of anesthetics.   Any blood disorders you have.   Previous surgeries you have had.   Medical conditions you have. RISKS AND COMPLICATIONS  Generally, this is a safe  procedure. However, as with any procedure, complications can occur. Possible complications include:  Excessive bleeding.   Infection of the uterus.   Damage to the cervix.   Development of scar tissue (adhesions) inside the uterus, later causing abnormal amounts of menstrual bleeding.   Complications from the general anesthetic, if a general anesthetic is  used.   Putting a hole (perforation) in the uterus. This is rare.  BEFORE THE PROCEDURE   Eat and drink before the procedure only as directed by your health care provider.   Arrange for someone to take you home.  PROCEDURE  This procedure usually takes about 15-30 minutes.  You will be given one of the following:  A medicine that numbs the area in and around the cervix (local anesthetic).   A medicine to make you sleep through the procedure (general anesthetic).  You will lie on your back with your legs in stirrups.   A warm metal or plastic instrument (speculum) will be placed in your vagina to keep it open and to allow the health care provider to see the cervix.  There are two ways in which your cervix can be softened and dilated. These include:   Taking a medicine.   Having thin rods (laminaria) inserted into your cervix.   A curved tool (curette) will be used to scrape cells from the inside lining of the uterus. In some cases, gentle suction is applied with the curette. The curette will then be removed.  AFTER THE PROCEDURE   You will rest in the recovery area until you are stable and are ready to go home.   You may feel sick to your stomach (nauseous) or throw up (vomit) if you were given a general anesthetic.   You may have a sore throat if a tube was placed in your throat during general anesthesia.   You may have light cramping and bleeding. This may last for 2 days to 2 weeks after the procedure.   Your uterus needs to make a new lining after the procedure. This may make your next period  late.   This information is not intended to replace advice given to you by your health care provider. Make sure you discuss any questions you have with your health care provider.   Document Released: 04/19/2005 Document Revised: 12/20/2012 Document Reviewed: 11/16/2012 Elsevier Interactive Patient Education 2016 Elsevier Inc.  Dilation and Curettage or Vacuum Curettage, Care After These instructions give you information on caring for yourself after your procedure. Your doctor may also give you more specific instructions. Call your doctor if you have any problems or questions after your procedure. HOME CARE  Do not drive for 24 hours.  Wait 1 week before doing any activities that wear you out.  Take your temperature 2 times a day for 4 days. Write it down. Tell your doctor if you have a fever.  Do not stand for a long time.  Do not lift, push, or pull anything over 10 pounds (4.5 kilograms).  Limit stair climbing to once or twice a day.  Rest often.  Continue with your usual diet.  Drink enough fluids to keep your pee (urine) clear or pale yellow.  If you have a hard time pooping (constipation), you may:  Take a medicine to help you go poop (laxative) as told by your doctor.  Eat more fruit and bran.  Drink more fluids.  Take showers, not baths, for as long as told by your doctor.  Do not swim or use a hot tub until your doctor says it is okay.  Have someone with you for 1-2 days after the procedure.  Do not douche, use tampons, or have sex (intercourse) for 2 weeks.  Only take medicines as told by your doctor. Do not take aspirin. It can cause bleeding.  Keep all doctor visits.  GET HELP IF:  You have cramps or pain not helped by medicine.  You have new pain in the belly (abdomen).  You have a bad smelling fluid coming from your vagina.  You have a rash.  You have problems with any medicine. GET HELP RIGHT AWAY IF:   You start to bleed more than a regular  period.  You have a fever.  You have chest pain.  You have trouble breathing.  You feel dizzy or feel like passing out (fainting).  You pass out.  You have pain in the tops of your shoulders.  You have vaginal bleeding with or without clumps of blood (blood clots). MAKE SURE YOU:  Understand these instructions.  Will watch your condition.  Will get help right away if you are not doing well or get worse.   This information is not intended to replace advice given to you by your health care provider. Make sure you discuss any questions you have with your health care provider.   Document Released: 01/27/2008 Document Revised: 04/24/2013 Document Reviewed: 11/16/2012 Elsevier Interactive Patient Education 2016 Elsevier Inc. PATIENT INSTRUCTIONS POST-ANESTHESIA  IMMEDIATELY FOLLOWING SURGERY:  Do not drive or operate machinery for the first twenty four hours after surgery.  Do not make any important decisions for twenty four hours after surgery or while taking narcotic pain medications or sedatives.  If you develop intractable nausea and vomiting or a severe headache please notify your doctor immediately.  FOLLOW-UP:  Please make an appointment with your surgeon as instructed. You do not need to follow up with anesthesia unless specifically instructed to do so.  WOUND CARE INSTRUCTIONS (if applicable):  Keep a dry clean dressing on the anesthesia/puncture wound site if there is drainage.  Once the wound has quit draining you may leave it open to air.  Generally you should leave the bandage intact for twenty four hours unless there is drainage.  If the epidural site drains for more than 36-48 hours please call the anesthesia department.  QUESTIONS?:  Please feel free to call your physician or the hospital operator if you have any questions, and they will be happy to assist you.

## 2015-12-22 ENCOUNTER — Telehealth: Payer: Self-pay | Admitting: *Deleted

## 2015-12-22 ENCOUNTER — Other Ambulatory Visit: Payer: Self-pay | Admitting: Obstetrics and Gynecology

## 2015-12-22 ENCOUNTER — Encounter (HOSPITAL_COMMUNITY)
Admission: RE | Admit: 2015-12-22 | Discharge: 2015-12-22 | Disposition: A | Discharge status: Home / Self Care | Payer: No Typology Code available for payment source | Source: Ambulatory Visit | Attending: Obstetrics and Gynecology | Admitting: Obstetrics and Gynecology

## 2015-12-22 ENCOUNTER — Encounter (HOSPITAL_COMMUNITY): Payer: Self-pay

## 2015-12-22 DIAGNOSIS — Z01812 Encounter for preprocedural laboratory examination: Secondary | ICD-10-CM | POA: Diagnosis not present

## 2015-12-22 HISTORY — DX: Nontoxic single thyroid nodule: E04.1

## 2015-12-22 HISTORY — DX: Other specified postprocedural states: R11.2

## 2015-12-22 HISTORY — DX: Chronic obstructive pulmonary disease, unspecified: J44.9

## 2015-12-22 HISTORY — DX: Anemia, unspecified: D64.9

## 2015-12-22 HISTORY — DX: Other specified postprocedural states: Z98.890

## 2015-12-22 LAB — URINE MICROSCOPIC-ADD ON

## 2015-12-22 LAB — COMPREHENSIVE METABOLIC PANEL
ALK PHOS: 85 U/L (ref 38–126)
ALT: 20 U/L (ref 14–54)
AST: 16 U/L (ref 15–41)
Albumin: 3.8 g/dL (ref 3.5–5.0)
Anion gap: 4 — ABNORMAL LOW (ref 5–15)
BUN: 13 mg/dL (ref 6–20)
CALCIUM: 8.7 mg/dL — AB (ref 8.9–10.3)
CHLORIDE: 106 mmol/L (ref 101–111)
CO2: 23 mmol/L (ref 22–32)
CREATININE: 0.61 mg/dL (ref 0.44–1.00)
GFR calc Af Amer: >60 mL/min (ref >60)
Glucose, Bld: 88 mg/dL (ref 65–99)
Potassium: 4.1 mmol/L (ref 3.5–5.1)
Sodium: 133 mmol/L — ABNORMAL LOW (ref 135–145)
Total Bilirubin: 0.2 mg/dL — ABNORMAL LOW (ref 0.3–1.2)
Total Protein: 6.2 g/dL — ABNORMAL LOW (ref 6.5–8.1)

## 2015-12-22 LAB — URINALYSIS, ROUTINE W REFLEX MICROSCOPIC
BILIRUBIN URINE: NEGATIVE
GLUCOSE, UA: NEGATIVE mg/dL
KETONES UR: NEGATIVE mg/dL
Leukocytes, UA: NEGATIVE
Nitrite: NEGATIVE
PH: 5.5 (ref 5.0–8.0)
Protein, ur: NEGATIVE mg/dL

## 2015-12-22 LAB — CBC
HEMATOCRIT: 38.4 % (ref 36.0–46.0)
HEMOGLOBIN: 13.1 g/dL (ref 12.0–15.0)
MCH: 33.8 pg (ref 26.0–34.0)
MCHC: 34.1 g/dL (ref 30.0–36.0)
MCV: 99 fL (ref 78.0–100.0)
PLATELETS: 321 10*3/uL (ref 150–400)
RBC: 3.88 MIL/uL (ref 3.87–5.11)
RDW: 12.8 % (ref 11.5–15.5)
WBC: 9 10*3/uL (ref 4.0–10.5)

## 2015-12-22 LAB — HCG, SERUM, QUALITATIVE: Preg, Serum: NEGATIVE

## 2015-12-22 NOTE — Telephone Encounter (Signed)
Pt called back and stated spoke with Dr. Glo Herring at [facility] and he took care of the Rx that was needed.

## 2015-12-22 NOTE — Telephone Encounter (Signed)
Pt states Dr. Glo Herring was going to prescribed a Rx prior to surgery on 12/26/2015. The Medication is to thin the lining of uterus. Please advise.

## 2015-12-22 NOTE — H&P (Signed)
Patient ID: Lisa Ryan, female   DOB: 02-Dec-1975, 40 y.o.   MRN: [id number]    [facility] Visit  @DATE @            Patient name: Lisa Ryan                     MRN [id number]  Date of birth: Nov 17, 1975  CC & HPI:       Chief Complaint  Patient presents with  . Follow-up    possible biopsy      Lisa Ryan is a 40 y.o. female presenting today for f/u of pelvic/transvaginal US results today. Pt states she has had several episodes of AUB along with her ongoing dysmenorrhea over the last 2 years. She denies fever, chills, cough, congestion, SOB, cold or flu symptoms. Pt is a current smoker.   ROS:  Review of Systems  Genitourinary:       +AUB, dysmenorrhea    Pertinent History Reviewed:   Reviewed Medical             Past Medical History:  Diagnosis Date  . Anxiety state, unspecified 10/12/2007  . CHEST PAIN, ATYPICAL 02/18/2010  . CHEST PAIN-PRECORDIAL 02/19/2010  . CHF (congestive heart failure) ([place])    During Pregnancy  . FATIGUE 09/28/2007  . GASTROENTERITIS 10/03/2008  . GERD (gastroesophageal reflux disease)   . MIGRAINE HEADACHE 03/04/2010  . PEDAL EDEMA 02/18/2010  . PEPTIC ULCER DISEASE 09/28/2007  . 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 SEIZURE DISORDER 09/28/2007  . Tobacco abuse   . WEIGHT GAIN 09/28/2007                              Surgical Hx:         Past Surgical History:  Procedure Laterality Date  . ANKLE SURGERY     left  . APPENDECTOMY    . CESAREAN SECTION     x3  . CHOLECYSTECTOMY    . KNEE ARTHROSCOPY     left  . MOUTH LESION EXCISIONAL BIOPSY  04/2011   pre-cancerous, pallet of mouth   Medications: Reviewed & Updated - see associated section                       Current Outpatient Prescriptions:  .  fluticasone-salmeterol (ADVAIR HFA) 115-21 MCG/ACT inhaler, Inhale 2 puffs into the lungs 2 (two) times daily. (Patient taking differently: Inhale 2 puffs into the  lungs daily as needed (SOB). ), Disp: 1 Inhaler, Rfl: 5 .  levalbuterol (XOPENEX) 0.63 MG/3ML nebulizer solution, Take 3 mLs (0.63 mg total) by nebulization every 4 (four) hours as needed for wheezing or shortness of breath., Disp: 60 mL, Rfl: 5 .  megestrol (MEGACE) 40 MG tablet, Take 1 tablet (40 mg total) by mouth 3 (three) times daily., Disp: 45 tablet, Rfl: 2 .  Spacer/Aero-Holding Chambers (AEROCHAMBER MV) inhaler, Use as instructed, Disp: 1 each, Rfl: 0   Social History: Reviewed -  reports that she has been smoking Cigarettes.  She has a 20.00 pack-year smoking history. She has never used smokeless tobacco.  Objective Findings:  Vitals: Blood pressure 120/80, height 5\' 7"  (1.702 m), weight 171 lb (77.6 kg), last menstrual period 12/01/2015.  Physical Examination: General appearance - alert, well appearing, and in no distress Mental status - alert, oriented to person, place, and time Neck - supple, no significant adenopathy Chest -  deep inspiratory wheezes bilaterally, no rales or rhonchi, symmetric air entry Heart - normal rate, regular rhythm, normal S1, S2, no murmurs, rubs, clicks or gallops Musculoskeletal - no joint tenderness, deformity or swelling Extremities - peripheral pulses normal, no pedal edema, no clubbing or cyanosis Skin - normal coloration and turgor, no rashes, no suspicious skin lesions noted   Discussed with pt risks and benefits of IUD vs endometrial ablation. Pt states she would like to pursue ablation at this time. Pt states she wants to forgo the endometrial biopsy. Risks of tissue sampling at time of ablation discussed with patient. Patient verbalizes her understanding of risks and would like to proceed. At end of discussion, pt had opportunity to ask questions and has no further questions at this time.   Greater than 50% was spent in counseling and coordination of care with the patient. Total time greater than: 25 minutes   Assessment & Plan:   A:   1. AUB, dysmenorrhea x 2 years  2. Pelvic US showed 11.4 mm symmetrical endometrium  3. Pt requesting endometrial ablation requests that biopsy be done at time of procedure, declines assessment today  P:  1. D/c Megace, Rx Provera  2. Schedule endometrial ablation today  3. F/u for post op    By signing my name below, I, Hansel Feinstein, attest that this documentation has been prepared under the direction and in the presence of Jonnie Kind, MD. Electronically Signed: Hansel Feinstein, ED Scribe. 12/12/15. 12:28 PM.  I personally performed the services described in this documentation, which was SCRIBED in my presence. The recorded information has been reviewed and considered accurate. It has been edited as necessary during review. Jonnie Kind, MD

## 2015-12-23 ENCOUNTER — Other Ambulatory Visit: Payer: Self-pay | Admitting: Obstetrics and Gynecology

## 2015-12-24 ENCOUNTER — Encounter (HOSPITAL_COMMUNITY): Payer: Self-pay | Admitting: Anesthesiology

## 2015-12-24 NOTE — Anesthesia Preprocedure Evaluation (Addendum)
Anesthesia Evaluation  Patient identified by MRN, date of birth, ID band Patient awake    Reviewed: Allergy & Precautions, NPO status , Patient's Chart, lab work & pertinent test results  History of Anesthesia Complications (+) PONV and history of anesthetic complications  Airway Mallampati: III       Dental no notable dental hx. 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 Last Echo 2015 Normal with LVEF 60-65%   Neuro/Psych  Headaches, Seizures -, Well Controlled,  Anxiety CVA    GI/Hepatic Neg liver ROS, PUD, GERD  Medicated and Controlled,  Endo/Other  negative endocrine ROS  Renal/GU negative Renal ROS  negative genitourinary   Musculoskeletal negative musculoskeletal ROS (+)   Abdominal Normal abdominal exam  (+)   Peds  Hematology  (+) anemia ,   Anesthesia Other Findings   Reproductive/Obstetrics menorrhagia                            Lab Results  Component Value Date   WBC 9.0 12/22/2015   HGB 13.1 12/22/2015   HCT 38.4 12/22/2015   MCV 99.0 12/22/2015   PLT 321 12/22/2015     Chemistry      Component Value Date/Time   NA 133 (L) 12/22/2015 1425   K 4.1 12/22/2015 1425   CL 106 12/22/2015 1425   CO2 23 12/22/2015 1425   BUN 13 12/22/2015 1425   CREATININE 0.61 12/22/2015 1425      Component Value Date/Time   CALCIUM 8.7 (L) 12/22/2015 1425   ALKPHOS 85 12/22/2015 1425   AST 16 12/22/2015 1425   ALT 20 12/22/2015 1425   BILITOT 0.2 (L) 12/22/2015 1425     EKG: normal EKG, normal sinus rhythm, unchanged from previous tracings.  Anesthesia Physical Anesthesia Plan  ASA: II  Anesthesia Plan:  General   Post-op Pain Management:    Induction: Intravenous  Airway Management Planned: LMA  Additional Equipment:   Intra-op Plan:   Post-operative Plan: Extubation in OR  Informed Consent: I have reviewed the patients History and Physical, chart, labs and discussed the procedure including the risks, benefits and alternatives for the proposed anesthesia with the patient or authorized representative who has indicated his/her understanding and acceptance.   Dental advisory given  Plan Discussed with: Anesthesiologist, CRNA and Surgeon  Anesthesia Plan Comments:         Anesthesia Quick Evaluation

## 2015-12-26 ENCOUNTER — Ambulatory Visit (HOSPITAL_COMMUNITY): Payer: No Typology Code available for payment source | Admitting: Anesthesiology

## 2015-12-26 ENCOUNTER — Ambulatory Visit (HOSPITAL_COMMUNITY)
Admission: RE | Admit: 2015-12-26 | Discharge: 2015-12-26 | Disposition: A | Discharge status: Home / Self Care | Payer: No Typology Code available for payment source | Source: Ambulatory Visit | Attending: Obstetrics and Gynecology | Admitting: Obstetrics and Gynecology

## 2015-12-26 ENCOUNTER — Encounter (HOSPITAL_COMMUNITY): Payer: Self-pay | Admitting: *Deleted

## 2015-12-26 ENCOUNTER — Ambulatory Visit (HOSPITAL_COMMUNITY): Payer: No Typology Code available for payment source

## 2015-12-26 ENCOUNTER — Encounter (HOSPITAL_COMMUNITY)
Admission: RE | Disposition: A | Discharge status: Home / Self Care | Payer: Self-pay | Source: Ambulatory Visit | Attending: Obstetrics and Gynecology

## 2015-12-26 DIAGNOSIS — J449 Chronic obstructive pulmonary disease, unspecified: Secondary | ICD-10-CM | POA: Insufficient documentation

## 2015-12-26 DIAGNOSIS — N920 Excessive and frequent menstruation with regular cycle: Secondary | ICD-10-CM | POA: Diagnosis present

## 2015-12-26 DIAGNOSIS — R5383 Other fatigue: Secondary | ICD-10-CM | POA: Diagnosis not present

## 2015-12-26 DIAGNOSIS — D649 Anemia, unspecified: Secondary | ICD-10-CM | POA: Diagnosis not present

## 2015-12-26 DIAGNOSIS — N946 Dysmenorrhea, unspecified: Secondary | ICD-10-CM | POA: Diagnosis not present

## 2015-12-26 DIAGNOSIS — N938 Other specified abnormal uterine and vaginal bleeding: Secondary | ICD-10-CM | POA: Diagnosis not present

## 2015-12-26 DIAGNOSIS — F419 Anxiety disorder, unspecified: Secondary | ICD-10-CM | POA: Insufficient documentation

## 2015-12-26 DIAGNOSIS — F17210 Nicotine dependence, cigarettes, uncomplicated: Secondary | ICD-10-CM | POA: Diagnosis not present

## 2015-12-26 DIAGNOSIS — Z9049 Acquired absence of other specified parts of digestive tract: Secondary | ICD-10-CM | POA: Insufficient documentation

## 2015-12-26 DIAGNOSIS — G40909 Epilepsy, unspecified, not intractable, without status epilepticus: Secondary | ICD-10-CM | POA: Insufficient documentation

## 2015-12-26 DIAGNOSIS — K219 Gastro-esophageal reflux disease without esophagitis: Secondary | ICD-10-CM | POA: Insufficient documentation

## 2015-12-26 DIAGNOSIS — R0789 Other chest pain: Secondary | ICD-10-CM

## 2015-12-26 HISTORY — PX: DILITATION & CURRETTAGE/HYSTROSCOPY WITH NOVASURE ABLATION: SHX5568

## 2015-12-26 LAB — TROPONIN I: Troponin I: <0.03 ng/mL (ref <0.03)

## 2015-12-26 SURGERY — DILATATION & CURETTAGE/HYSTEROSCOPY WITH NOVASURE ABLATION
Anesthesia: General

## 2015-12-26 MED ORDER — HYDROCODONE-ACETAMINOPHEN 7.5-325 MG PO TABS: 1.0000 | ORAL_TABLET | Freq: Once | ORAL | Status: DC | PRN

## 2015-12-26 MED ORDER — BUPIVACAINE-EPINEPHRINE (PF) 0.5% -1:200000 IJ SOLN
INTRAMUSCULAR | Status: AC
  Filled 2015-12-26: qty 30

## 2015-12-26 MED ORDER — PROMETHAZINE HCL 25 MG/ML IJ SOLN
6.2500 mg | INTRAMUSCULAR | Status: DC | PRN
  Administered 2015-12-26: 12.5 mg via INTRAVENOUS

## 2015-12-26 MED ORDER — MIDAZOLAM HCL 2 MG/2ML IJ SOLN
INTRAMUSCULAR | Status: DC | PRN
  Administered 2015-12-26: 2 mg via INTRAVENOUS

## 2015-12-26 MED ORDER — NITROGLYCERIN 0.4 MG SL SUBL
0.4000 mg | SUBLINGUAL_TABLET | Freq: Once | SUBLINGUAL | Status: AC
  Administered 2015-12-26: 0.4 mg via SUBLINGUAL

## 2015-12-26 MED ORDER — DEXAMETHASONE SODIUM PHOSPHATE 4 MG/ML IJ SOLN
INTRAMUSCULAR | Status: AC
  Filled 2015-12-26: qty 1

## 2015-12-26 MED ORDER — KETOROLAC TROMETHAMINE 10 MG PO TABS: 10.0000 mg | ORAL_TABLET | Freq: Four times a day (QID) | ORAL | 0 refills | Status: DC | PRN

## 2015-12-26 MED ORDER — ONDANSETRON HCL 4 MG/2ML IJ SOLN
INTRAMUSCULAR | Status: AC
  Filled 2015-12-26: qty 2

## 2015-12-26 MED ORDER — PROPOFOL 10 MG/ML IV BOLUS
INTRAVENOUS | Status: AC
Start: 2015-12-26 — End: 2015-12-26
  Filled 2015-12-26: qty 40

## 2015-12-26 MED ORDER — BUPIVACAINE-EPINEPHRINE (PF) 0.5% -1:200000 IJ SOLN
INTRAMUSCULAR | Status: DC | PRN
  Administered 2015-12-26: 20 mL via PERINEURAL

## 2015-12-26 MED ORDER — GI COCKTAIL ~~LOC~~
30.0000 mL | Freq: Once | ORAL | Status: AC
  Administered 2015-12-26: 30 mL via ORAL
  Filled 2015-12-26: qty 30

## 2015-12-26 MED ORDER — MIDAZOLAM HCL 2 MG/2ML IJ SOLN
1.0000 mg | INTRAMUSCULAR | Status: DC | PRN
  Administered 2015-12-26: 1 mg via INTRAVENOUS

## 2015-12-26 MED ORDER — FENTANYL CITRATE (PF) 100 MCG/2ML IJ SOLN
25.0000 ug | INTRAMUSCULAR | Status: DC | PRN
  Administered 2015-12-26 (×3): 50 ug via INTRAVENOUS

## 2015-12-26 MED ORDER — LIDOCAINE HCL (CARDIAC) 10 MG/ML IV SOLN
INTRAVENOUS | Status: DC | PRN
  Administered 2015-12-26: 50 mg via INTRAVENOUS

## 2015-12-26 MED ORDER — LACTATED RINGERS IV SOLN
INTRAVENOUS | Status: DC | PRN
  Administered 2015-12-26: 08:00:00 via INTRAVENOUS

## 2015-12-26 MED ORDER — KETOROLAC TROMETHAMINE 30 MG/ML IJ SOLN
INTRAMUSCULAR | Status: AC
  Filled 2015-12-26: qty 1

## 2015-12-26 MED ORDER — LIDOCAINE HCL (PF) 1 % IJ SOLN
INTRAMUSCULAR | Status: AC
  Filled 2015-12-26: qty 5

## 2015-12-26 MED ORDER — SCOPOLAMINE 1 MG/3DAYS TD PT72
MEDICATED_PATCH | TRANSDERMAL | Status: AC
  Filled 2015-12-26: qty 1

## 2015-12-26 MED ORDER — FENTANYL CITRATE (PF) 100 MCG/2ML IJ SOLN
INTRAMUSCULAR | Status: AC
  Filled 2015-12-26: qty 2

## 2015-12-26 MED ORDER — DEXAMETHASONE SODIUM PHOSPHATE 4 MG/ML IJ SOLN
INTRAMUSCULAR | Status: DC | PRN
  Administered 2015-12-26: 4 mg via INTRAVENOUS

## 2015-12-26 MED ORDER — PROMETHAZINE HCL 25 MG/ML IJ SOLN
INTRAMUSCULAR | Status: AC
  Filled 2015-12-26: qty 1

## 2015-12-26 MED ORDER — SCOPOLAMINE 1 MG/3DAYS TD PT72
MEDICATED_PATCH | TRANSDERMAL | Status: DC | PRN
  Administered 2015-12-26: 1 via TRANSDERMAL

## 2015-12-26 MED ORDER — TRAMADOL HCL 50 MG PO TABS: 50.0000 mg | ORAL_TABLET | Freq: Four times a day (QID) | ORAL | 0 refills | Status: DC | PRN

## 2015-12-26 MED ORDER — MEPERIDINE HCL 50 MG/ML IJ SOLN: 6.2500 mg | INTRAMUSCULAR | Status: DC | PRN

## 2015-12-26 MED ORDER — 0.9 % SODIUM CHLORIDE (POUR BTL) OPTIME
TOPICAL | Status: DC | PRN
  Administered 2015-12-26: 1000 mL

## 2015-12-26 MED ORDER — SODIUM CHLORIDE 0.9 % IR SOLN
Status: DC | PRN
  Administered 2015-12-26: 3000 mL

## 2015-12-26 MED ORDER — MIDAZOLAM HCL 2 MG/2ML IJ SOLN
INTRAMUSCULAR | Status: AC
  Filled 2015-12-26: qty 2

## 2015-12-26 MED ORDER — FENTANYL CITRATE (PF) 100 MCG/2ML IJ SOLN
INTRAMUSCULAR | Status: DC | PRN
  Administered 2015-12-26 (×2): 50 ug via INTRAVENOUS

## 2015-12-26 MED ORDER — FENTANYL CITRATE (PF) 100 MCG/2ML IJ SOLN
50.0000 ug | Freq: Once | INTRAMUSCULAR | Status: AC
  Administered 2015-12-26: 50 ug via INTRAVENOUS

## 2015-12-26 MED ORDER — ONDANSETRON HCL 4 MG/2ML IJ SOLN
INTRAMUSCULAR | Status: DC | PRN
  Administered 2015-12-26: 4 mg via INTRAVENOUS

## 2015-12-26 MED ORDER — NITROGLYCERIN 0.4 MG SL SUBL
SUBLINGUAL_TABLET | SUBLINGUAL | Status: AC
  Filled 2015-12-26: qty 1

## 2015-12-26 MED ORDER — KETOROLAC TROMETHAMINE 30 MG/ML IJ SOLN
30.0000 mg | Freq: Once | INTRAMUSCULAR | Status: AC
  Administered 2015-12-26: 30 mg via INTRAVENOUS

## 2015-12-26 MED ORDER — PROPOFOL 10 MG/ML IV BOLUS
INTRAVENOUS | Status: DC | PRN
  Administered 2015-12-26: 180 mg via INTRAVENOUS
  Administered 2015-12-26: 20 mg via INTRAVENOUS

## 2015-12-26 SURGICAL SUPPLY — 28 items
ABLATOR ENDOMETRIAL BIPOLAR (ABLATOR) ×3 IMPLANT
BAG HAMPER (MISCELLANEOUS) ×3 IMPLANT
CATH ROBINSON RED A/P 16FR (CATHETERS) IMPLANT
CLOTH BEACON ORANGE TIMEOUT ST (SAFETY) ×3 IMPLANT
COVER LIGHT HANDLE STERIS (MISCELLANEOUS) ×6 IMPLANT
DECANTER SPIKE VIAL GLASS SM (MISCELLANEOUS) ×3 IMPLANT
FORMALIN 10 PREFIL 120ML (MISCELLANEOUS) ×3 IMPLANT
GLOVE BIOGEL M 7.0 STRL (GLOVE) ×3 IMPLANT
GLOVE BIOGEL PI IND STRL 7.0 (GLOVE) ×1 IMPLANT
GLOVE BIOGEL PI IND STRL 9 (GLOVE) ×1 IMPLANT
GLOVE BIOGEL PI INDICATOR 7.0 (GLOVE) ×2
GLOVE BIOGEL PI INDICATOR 9 (GLOVE) ×2
GLOVE ECLIPSE 9.0 STRL (GLOVE) ×3 IMPLANT
GLOVE EXAM NITRILE PF MED BLUE (GLOVE) ×3 IMPLANT
GOWN SPEC L3 XXLG W/TWL (GOWN DISPOSABLE) ×3 IMPLANT
GOWN STRL REUS W/TWL LRG LVL3 (GOWN DISPOSABLE) ×3 IMPLANT
INST SET HYSTEROSCOPY (KITS) ×3 IMPLANT
IV NS IRRIG 3000ML ARTHROMATIC (IV SOLUTION) ×3 IMPLANT
KIT ROOM TURNOVER AP CYSTO (KITS) ×3 IMPLANT
KIT ROOM TURNOVER APOR (KITS) ×3 IMPLANT
MANIFOLD NEPTUNE II (INSTRUMENTS) ×3 IMPLANT
NS IRRIG 1000ML POUR BTL (IV SOLUTION) ×3 IMPLANT
PACK PERI GYN (CUSTOM PROCEDURE TRAY) ×3 IMPLANT
PAD ARMBOARD 7.5X6 YLW CONV (MISCELLANEOUS) ×3 IMPLANT
PAD TELFA 3X4 1S STER (GAUZE/BANDAGES/DRESSINGS) ×3 IMPLANT
SET BASIN LINEN APH (SET/KITS/TRAYS/PACK) ×3 IMPLANT
SET IRRIG Y TYPE TUR BLADDER L (SET/KITS/TRAYS/PACK) ×3 IMPLANT
SYR CONTROL 10ML LL (SYRINGE) ×3 IMPLANT

## 2015-12-26 NOTE — H&P (Signed)
[facility] Clinic Visit  @DATE @            Patient name: Lisa Ryan                     MRN [id number]  Date of birth: 08-28-1975      Chief Complaint  Patient presents with  . Menorrhagia    irregular periods, heavy periods, mood swings, severe cramping, extreme exhaustion.     CC & HPI:  Lisa Ryan is a 40 y.o. female presenting today for Hysteroscopy, dilation and curettage, endometrial ablation after evaluation of abnormal vaginal bleeding and dysmenorrhea. Pt states she skipped a period in May and June, bled last month for 18 days and used 75-100 super+ tampons. She also reports her HGB was 3.7 and was started on iron supplements at this time. She reports that the bleeding stopped for 1.5 weeks and started again 1 week ago. Per pt, prior to two years ago, her periods were regular and her bleeding and dysmenorrhea was not as intense. Pt states her pelvic pain associated with her periods radiates to her legs and her lower back. Pt was referred by her PCP, Dr. Dema Severin, with [facility]. She has not obtained any [place] prior to this visit. She also complains of HA and intense mood swings with her periods.   She also complaints of SUI, which is easily triggered by sneezing, coughing or laughing. We are not addressing this today She denies difficulty passing bowel movements.   ROS:  Review of Systems  Genitourinary:       +dysmenorrhea, AUB, SUI   Husband, a LAWYER, accompanies pt to visit. Pertinent History Reviewed:   Reviewed: Significant for cesarean section  Medical             Past Medical History:  Diagnosis Date  . Anxiety state, unspecified 10/12/2007  . CHEST PAIN, ATYPICAL 02/18/2010  . CHEST PAIN-PRECORDIAL 02/19/2010  . CHF (congestive heart failure) ([place])    During Pregnancy  . FATIGUE 09/28/2007  . GASTROENTERITIS 10/03/2008  . GERD (gastroesophageal reflux disease)   . MIGRAINE HEADACHE 03/04/2010  . PEDAL EDEMA 02/18/2010  . PEPTIC ULCER DISEASE  09/28/2007  . 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 SEIZURE DISORDER 09/28/2007  . Tobacco abuse   . WEIGHT GAIN 09/28/2007                              Surgical Hx:         Past Surgical History:  Procedure Laterality Date  . ANKLE SURGERY     left  . APPENDECTOMY    . CESAREAN SECTION     x3  . CHOLECYSTECTOMY    . KNEE ARTHROSCOPY     left  . MOUTH LESION EXCISIONAL BIOPSY  04/2011   pre-cancerous, pallet of mouth   Medications: Reviewed & Updated - see associated section                       Current Outpatient Prescriptions:  .  fluticasone-salmeterol (ADVAIR HFA) 115-21 MCG/ACT inhaler, Inhale 2 puffs into the lungs 2 (two) times daily. (Patient taking differently: Inhale 2 puffs into the lungs daily as needed (SOB). ), Disp: 1 Inhaler, Rfl: 5 .  levalbuterol (XOPENEX) 0.63 MG/3ML nebulizer solution, Take 3 mLs (0.63 mg total) by nebulization every 4 (four) hours as needed for wheezing or shortness of breath., Disp:  60 mL, Rfl: 5 .  Spacer/Aero-Holding Chambers (AEROCHAMBER MV) inhaler, Use as instructed, Disp: 1 each, Rfl: 0   Social History: Reviewed -  reports that she has been smoking Cigarettes.  She has a 20.00 pack-year smoking history. She has never used smokeless tobacco.  Objective Findings:  Vitals: Blood pressure 110/72, height 5\' 7"  (1.702 m), weight 172 lb (78 kg), last menstrual period 12/01/2015.  Physical Examination: General appearance - alert, well appearing, and in no distress Mental status - alert, oriented to person, place, and time Abdomen - soft, nontender, nondistended, no masses or organomegaly Pelvic -  VULVA: normal appearing vulva with no masses, tenderness or lesions,  VAGINA: normal appearing vagina with normal color and discharge, no lesions, excellent posterior support  CERVIX: normal appearing cervix without discharge or lesions, small, adequate support  UTERUS: uterus is normal size, shape,  consistency and nontender, small, anterior, well healed scar tissue from previous c-section  ADNEXA: normal adnexa in size, nontender and no masses Musculoskeletal - no joint tenderness, deformity or swelling Extremities - peripheral pulses normal, no pedal edema, no clubbing or cyanosis Skin - normal coloration and turgor, no rashes, no suspicious skin lesions noted  Discussed with pt risks and benefits of bladder tack and endometrial ablation. At end of discussion, pt had opportunity to ask questions and has no further questions at this time.  Discussed with pt risks and benefits of IUD vs endometrial ablation. Pt states she would like to pursue ablation at this time. Pt states she wants to forgo the endometrial biopsy. Risks of tissue sampling at time of ablation discussed with patient. Patient verbalizes her understanding of risks and would like to proceed. At end of discussion, pt had opportunity to ask questions and has no further questions at this time.   Greater than 50% was spent in counseling and coordination of care with the patient. Total time greater than: 15 minutes   Assessment & Plan:   A:  1. AUB, dysmenorrhea, worsening over the last 2 years  2. Ongoing SUI   P:  1. Scheduled for hysteroscopy, dilation and curettage, endometrial ablation this a.m.     By signing my name below, I, Hansel Feinstein, attest that this documentation has been prepared under the direction and in the presence of Jonnie Kind, MD. Electronically Signed: Hansel Feinstein, ED Scribe. 12/08/15. 4:59 PM.  .I personally performed the services described in this documentation, which was SCRIBED in my presence. The recorded information has been reviewed and considered accurate. It has been edited as necessary during review. Jonnie Kind, MD

## 2015-12-26 NOTE — Discharge Instructions (Signed)
Endometrial Ablation °Endometrial ablation removes the lining of the uterus (endometrium). It is usually a same-day, outpatient treatment. Ablation helps avoid major surgery, such as surgery to remove the cervix and uterus (hysterectomy). After endometrial ablation, you will have little or no menstrual bleeding and may not be able to have children. However, if you are premenopausal, you will need to use a reliable method of birth control following the procedure because of the small chance that pregnancy can occur. °There are different reasons to have this procedure. These reasons include: °· Heavy periods. °· Bleeding that is causing anemia. °· Irregular bleeding. °· Bleeding fibroids on the lining inside the uterus if they are smaller than 3 centimeters. °This procedure may not be possible for you if:  °· You want to have children in the future.   °· You have severe cramps with your menstrual period.   °· You have precancerous or cancerous cells in your uterus.   °· You were recently pregnant.   °· You have gone through menopause.   °· You have had major surgery on your uterus, resulting in thinning of the uterine wall. Surgeries may include: °¨ The removal of one or more uterine fibroids (myomectomy). °¨ A cesarean section with a classic (vertical) incision on your uterus. Ask your health care provider what type of cesarean you had. Sometimes the scar on your skin is different than the scar on your uterus. °Even if you have had surgery on your uterus, certain types of ablation may still be safe for you. Talk with your health care provider. °LET YOUR HEALTH CARE PROVIDER KNOW ABOUT: °· Any allergies you have. °· All medicines you are taking, including vitamins, herbs, eye drops, creams, and over-the-counter medicines. °· Previous problems you or members of your family have had with the use of anesthetics. °· Any blood disorders you have. °· Previous surgeries you have had. °· Medical conditions you have. °RISKS AND  COMPLICATIONS  °Generally, this is a safe procedure. However, as with any procedure, complications can occur. Possible complications include: °· Perforation of the uterus. °· Bleeding. °· Infection of the uterus, bladder, or vagina. °· Injury to surrounding organs. °· An air bubble to the lung (air embolus). °· Pregnancy following the procedure. °· Failure of the procedure to help the problem, requiring hysterectomy. °· Decreased ability to diagnose cancer in the lining of the uterus. °BEFORE THE PROCEDURE °· The lining of the uterus must be tested to make sure there is no pre-cancerous or cancer cells present. °· An ultrasound may be performed to look at the size of the uterus and to check for abnormalities. °· Medicines may be given to thin the lining of the uterus. °PROCEDURE  °During the procedure, your health care provider will use a tool called a resectoscope to help see inside your uterus. There are different ways to remove the lining of your uterus.  °· Radiofrequency - This method uses a radiofrequency-alternating electric current to remove the lining of the uterus. °· Cryotherapy - This method uses extreme cold to freeze the lining of the uterus. °· Heated-Free Liquid - This method uses heated salt (saline) solution to remove the lining of the uterus. °· Microwave - This method uses high-energy microwaves to heat up the lining of the uterus to remove it. °· Thermal balloon - This method involves inserting a catheter with a balloon tip into the uterus. The balloon tip is filled with heated fluid to remove the lining of the uterus. °AFTER THE PROCEDURE  °After your procedure, do   not have sexual intercourse or insert anything into your vagina until permitted by your health care provider. After the procedure, you may experience: °· Cramps. °· Vaginal discharge. °· Frequent urination. °  °This information is not intended to replace advice given to you by your health care provider. Make sure you discuss any  questions you have with your health care provider. °  °Document Released: 02/27/2004 Document Revised: 01/08/2015 Document Reviewed: 09/20/2012 °Elsevier Interactive Patient Education ©2016 [employer] ° °

## 2015-12-26 NOTE — Op Note (Signed)
Please see the brief operative note for surgical details 

## 2015-12-26 NOTE — Anesthesia Postprocedure Evaluation (Signed)
Anesthesia Post Note  Patient: Lisa Ryan  Procedure(s) Performed: Procedure(s) (LRB): DILATATION & CURETTAGE/HYSTEROSCOPY WITH ENDOMETRIAL ABLATION (N/A)  Patient location during evaluation: PACU Anesthesia Type: General Level of consciousness: awake and alert and oriented Pain management: pain level controlled Vital Signs Assessment: post-procedure vital signs reviewed and stable Respiratory status: spontaneous breathing, nonlabored ventilation and respiratory function stable Cardiovascular status: blood pressure returned to baseline and stable Postop Assessment: no signs of nausea or vomiting Anesthetic complications: no Comments: Patient developed severe chest pressure in PACU. 12 lead EKG done which was normal. CXR basically normal. O2 Sats 100%. GI cocktail given with minimal improvement in symptoms. Inhaler used without significant improvement. Troponin was normal. She had some mild improvement with fentanyl IV. She was given versed  IV with improvement in her anxiety level. Scopolamine patch was removed. I cannot explain the reason for her chest pressure but I feel like it is non cardiopulmonary in origin. Patient being discharged home. Instructed to return to ER if her symptoms worsen.    Last Vitals:  Vitals:   12/26/15 1030 12/26/15 1044  BP: 110/79 113/73  Pulse: (!) 111 89  Resp: 13 16  Temp:  36.4 C    Last Pain:  Vitals:   12/26/15 1044  TempSrc: Oral  PainSc:                  Anuj Summons A.

## 2015-12-26 NOTE — Anesthesia Procedure Notes (Signed)
Procedure Name: LMA Insertion Date/Time: 12/26/2015 7:45 AM Performed by: Vista Deck Pre-anesthesia Checklist: Patient identified, Patient being monitored, Emergency Drugs available, Timeout performed and Suction available Patient Re-evaluated:Patient Re-evaluated prior to inductionOxygen Delivery Method: Circle System Utilized Preoxygenation: Pre-oxygenation with 100% oxygen Intubation Type: IV induction Ventilation: Mask ventilation without difficulty LMA: LMA inserted LMA Size: 4.0 Number of attempts: 1 Placement Confirmation: positive ETCO2 and breath sounds checked- equal and bilateral Tube secured with: Tape

## 2015-12-26 NOTE — Transfer of Care (Signed)
Immediate Anesthesia Transfer of Care Note  Patient: [place]  Procedure(s) Performed: Procedure(s): DILATATION & CURETTAGE/HYSTEROSCOPY WITH NOVASURE ABLATION (N/A)  Patient Location: PACU  Anesthesia Type:General  Level of Consciousness: awake, alert  and patient cooperative  Airway & Oxygen Therapy: Patient Spontanous Breathing and non-rebreather face mask  Post-op Assessment: Report given to RN, Post -op Vital signs reviewed and stable and Patient moving all extremities  Post vital signs: Reviewed and stable  Last Vitals:  Vitals:   12/26/15 0720 12/26/15 0725  BP: 111/71 120/73  Resp: 20 (!) 21  Temp:      Last Pain: There were no vitals filed for this visit.    Patients Stated Pain Goal: 7 ([id number])  Complications: No apparent anesthesia complications

## 2015-12-26 NOTE — Brief Op Note (Signed)
12/26/2015  8:23 AM  PATIENT:  Lisa Ryan  40 y.o. female  PRE-OPERATIVE DIAGNOSIS:  menorrhagia  POST-OPERATIVE DIAGNOSIS:  menorrhagia  PROCEDURE:  Procedure(s): DILATATION & CURETTAGE/HYSTEROSCOPY WITH NOVASURE ABLATION (N/A)  SURGEON:  Surgeon(s) and Role:    * Jonnie Kind, MD - Primary  PHYSICIAN ASSISTANT:   ASSISTANTS: none   ANESTHESIA:   general and paracervical block  EBL:  Total I/O In: 900 [I.V.:900] Out: 0   BLOOD ADMINISTERED:none  DRAINS: none   LOCAL MEDICATIONS USED:  MARCAINE    and Amount: 20 ml  SPECIMEN:  Source of Specimen:  Endometrial curettings  DISPOSITION OF SPECIMEN:  PATHOLOGY  COUNTS:  YES  TOURNIQUET:  * No tourniquets in log *  DICTATION: .Dragon Dictation  PLAN OF CARE: Discharge to home after PACU  PATIENT DISPOSITION:  PACU - hemodynamically stable.   Delay start of Pharmacological VTE agent (>24hrs) due to surgical blood loss or risk of bleeding: not applicable Details of procedure:. Patient was taken the operating room prepped and draped for vaginal procedure after anesthesia introduced. Timeout was conducted and procedure confirmed by surgical team.\  Speculum was inserted the cervix grasped with single-tooth tenaculum. The uterus was sounded in the anteflexed position to 9-10 cm.. It was then dilated easily to 25 [place], and paracervical block with 20 cc of Marcaine performed The 25 m 30 rigid hysteroscope was then introduced into the endometrial cavity and confirmed the location of tubal ostia and endometrial tissue lining that appeared smooth and normal in appearance. Smooth sharp curettage was obtained removing a appropriate amount of tissue which was sent [place] a tissue sample for pathology confirmation. NovaSure endometrial ablation device was then prepared, set at 6 cm endometrial cavity length, with cavity width measured 4.2 cm power 139 W for time of 1 minute 26 seconds was then performed without focality. Device  was removed appropriate amounts of tissue change on the mesh was noted and then the patient allowed to go recovery room in stable condition, sponge and needle counts correct

## 2015-12-29 ENCOUNTER — Telehealth: Payer: Self-pay | Admitting: Obstetrics and Gynecology

## 2015-12-29 NOTE — Telephone Encounter (Signed)
Pt informed of benign secretory endometrium on biopsy at time of endometrial ablation. Patient reports she is done fine and very happy with the results after her ablation

## 2015-12-31 ENCOUNTER — Encounter (HOSPITAL_COMMUNITY): Payer: Self-pay | Admitting: Obstetrics and Gynecology

## 2016-01-14 ENCOUNTER — Encounter: Payer: PRIVATE HEALTH INSURANCE | Admitting: Obstetrics and Gynecology

## 2016-02-23 ENCOUNTER — Ambulatory Visit (INDEPENDENT_AMBULATORY_CARE_PROVIDER_SITE_OTHER): Payer: No Typology Code available for payment source | Admitting: Orthopedic Surgery

## 2016-02-23 ENCOUNTER — Encounter: Payer: Self-pay | Admitting: Orthopedic Surgery

## 2016-02-23 ENCOUNTER — Ambulatory Visit (INDEPENDENT_AMBULATORY_CARE_PROVIDER_SITE_OTHER): Payer: No Typology Code available for payment source

## 2016-02-23 VITALS — BP 122/85 | HR 89 | Wt 171.0 lb

## 2016-02-23 DIAGNOSIS — M75101 Unspecified rotator cuff tear or rupture of right shoulder, not specified as traumatic: Secondary | ICD-10-CM

## 2016-02-23 DIAGNOSIS — M25511 Pain in right shoulder: Secondary | ICD-10-CM | POA: Diagnosis not present

## 2016-02-23 MED ORDER — NAPROXEN 500 MG PO TABS: 500.0000 mg | ORAL_TABLET | Freq: Two times a day (BID) | ORAL | 0 refills | Status: DC

## 2016-02-23 NOTE — Patient Instructions (Signed)

## 2016-02-23 NOTE — Progress Notes (Signed)
Patient ID: Lisa Ryan, female   DOB: 04-Jun-1975, 40 y.o.   MRN: [id number]  Chief Complaint  Patient presents with  . Shoulder Pain    right shoulder pain, injury early August, treadmill fall    HPI Lisa Ryan is a 40 y.o. female.  Presents for evaluation of right shoulder pain  She fell while walking on a treadmill had a lot of bruising all over her body. Presents now with painful right shoulder including night pain pain at rest pain with any attempts at forward elevation mild weakness.  Treatment to date only includes Aleve and Advil  Pain is progressively worsening motion is getting worse as well  Review of Systems Review of Systems  Constitutional: Negative for fever.  Musculoskeletal: Positive for arthralgias and myalgias. Negative for neck pain.  Neurological: Negative for numbness.     Past Medical History:  Diagnosis Date  . Anemia   . Anxiety state, unspecified 10/12/2007  . CHEST PAIN, ATYPICAL 02/18/2010  . CHEST PAIN-PRECORDIAL 02/19/2010  . CHF (congestive heart failure) ([place])    During Pregnancy  . COPD (chronic obstructive pulmonary disease) ([place])   . FATIGUE 09/28/2007  . GASTROENTERITIS 10/03/2008  . GERD (gastroesophageal reflux disease)   . MIGRAINE HEADACHE 03/04/2010  . PEDAL EDEMA 02/18/2010  . PEPTIC ULCER DISEASE 09/28/2007  . 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 PONV (postoperative nausea and vomiting)   . SEIZURE DISORDER 09/28/2007  . Stroke ([place])   . Thyroid nodule   . Tobacco abuse   . WEIGHT GAIN 09/28/2007    Past Surgical History:  Procedure Laterality Date  . ANKLE SURGERY     left  . APPENDECTOMY    . CESAREAN SECTION     x3  . CHOLECYSTECTOMY    . DILITATION & CURRETTAGE/HYSTROSCOPY WITH NOVASURE ABLATION N/A 12/26/2015   Procedure: DILATATION & CURETTAGE/HYSTEROSCOPY WITH ENDOMETRIAL ABLATION;  Surgeon: Jonnie Kind, MD;  Location: [facility];  Service: Gynecology;  Laterality: N/A;  . KNEE  ARTHROSCOPY     left  . MOUTH LESION EXCISIONAL BIOPSY  04/2011   pre-cancerous, pallet of mouth    Social History Social History  Substance Use Topics  . Smoking status: Current Every Day Smoker    Packs/day: 1.00    Years: 20.00    Types: Cigarettes  . Smokeless tobacco: Never Used     Comment: Gave pt sheet to quit smoking  . Alcohol use No    Allergies  Allergen Reactions  . Albuterol Anxiety and Other (See Comments)    Increased HR 130s  . Morphine Other (See Comments)    hallucinations    Current Meds  Medication Sig  . DULoxetine HCl (CYMBALTA PO) Take by mouth.  . fluticasone-salmeterol (ADVAIR HFA) 115-21 MCG/ACT inhaler Inhale 2 puffs into the lungs 2 (two) times daily. (Patient taking differently: Inhale 2 puffs into the lungs daily as needed (SOB). )  . levalbuterol (XOPENEX) 0.63 MG/3ML nebulizer solution Take 3 mLs (0.63 mg total) by nebulization every 4 (four) hours as needed for wheezing or shortness of breath.  . Spacer/Aero-Holding Chambers (AEROCHAMBER MV) inhaler Use as instructed      Physical Exam Physical Exam BP 122/85   Pulse 89   Wt 171 lb (77.6 kg)   BMI 26.78 kg/m   Gen. appearance. The patient is well-developed and well-nourished, grooming and hygiene are normal. There are no gross congenital abnormalities  The patient is alert and oriented to person place and time  Mood  and affect are normal  Ambulation NORMAL  Examination reveals the following: On inspection we find global tenderness in the right shoulder including the posterior inferior acromial  With the range of motion of  external rotation 50, active flexion 90 active abduction 90, passive flexion 120 with painful arc of motion between 90 and 120  Stability tests were normal    Strength tests revealed grade 5 motor strength in the infraspinatus and subscapularis but weakness in the supraspinatus  The impingement sign of Neer was positive apprehension sign  negative  Skin we find no rash ulceration or erythema  Sensation remains intact  Impression vascular system shows no peripheral edema  Left shoulder range of motion full strength normal impingement sign negative  Data Reviewed Normal x-rays  Assessment    Rotator cuff syndrome versus rotator cuff tear    Plan    Naproxen 500 mg twice a day Home exercise program the patient works in [place] the therapy Subacromial injection  Return in 3 weeks if no improvement recommend MRI right shoulder       Arther Abbott 02/23/2016, 4:44 PM

## 2016-03-08 ENCOUNTER — Encounter: Payer: Self-pay | Admitting: Orthopedic Surgery

## 2016-03-15 ENCOUNTER — Ambulatory Visit: Payer: No Typology Code available for payment source | Admitting: Orthopedic Surgery

## 2016-06-13 ENCOUNTER — Other Ambulatory Visit: Payer: Self-pay | Admitting: Orthopedic Surgery

## 2017-03-09 ENCOUNTER — Encounter (HOSPITAL_COMMUNITY): Payer: Self-pay | Admitting: Emergency Medicine

## 2017-03-09 ENCOUNTER — Other Ambulatory Visit: Payer: Self-pay

## 2017-03-09 ENCOUNTER — Emergency Department (HOSPITAL_COMMUNITY)
Admission: EM | Admit: 2017-03-09 | Discharge: 2017-03-10 | Disposition: A | Discharge status: Home / Self Care | Payer: No Typology Code available for payment source | Attending: Emergency Medicine | Admitting: Emergency Medicine

## 2017-03-09 DIAGNOSIS — F17210 Nicotine dependence, cigarettes, uncomplicated: Secondary | ICD-10-CM | POA: Insufficient documentation

## 2017-03-09 DIAGNOSIS — R197 Diarrhea, unspecified: Secondary | ICD-10-CM

## 2017-03-09 DIAGNOSIS — Z8673 Personal history of transient ischemic attack (TIA), and cerebral infarction without residual deficits: Secondary | ICD-10-CM | POA: Diagnosis not present

## 2017-03-09 DIAGNOSIS — I509 Heart failure, unspecified: Secondary | ICD-10-CM | POA: Diagnosis not present

## 2017-03-09 DIAGNOSIS — E876 Hypokalemia: Secondary | ICD-10-CM | POA: Insufficient documentation

## 2017-03-09 DIAGNOSIS — J449 Chronic obstructive pulmonary disease, unspecified: Secondary | ICD-10-CM | POA: Insufficient documentation

## 2017-03-09 DIAGNOSIS — Z79899 Other long term (current) drug therapy: Secondary | ICD-10-CM | POA: Diagnosis not present

## 2017-03-09 LAB — URINALYSIS, ROUTINE W REFLEX MICROSCOPIC
Bacteria, UA: NONE SEEN
Bilirubin Urine: NEGATIVE
GLUCOSE, UA: NEGATIVE mg/dL
Ketones, ur: NEGATIVE mg/dL
LEUKOCYTES UA: NEGATIVE
NITRITE: NEGATIVE
PROTEIN: NEGATIVE mg/dL
SPECIFIC GRAVITY, URINE: 1.017 (ref 1.005–1.030)
pH: 5 (ref 5.0–8.0)

## 2017-03-09 LAB — CBC
HCT: 40.1 % (ref 36.0–46.0)
Hemoglobin: 13.7 g/dL (ref 12.0–15.0)
MCH: 33.9 pg (ref 26.0–34.0)
MCHC: 34.2 g/dL (ref 30.0–36.0)
MCV: 99.3 fL (ref 78.0–100.0)
PLATELETS: 305 10*3/uL (ref 150–400)
RBC: 4.04 MIL/uL (ref 3.87–5.11)
RDW: 12.6 % (ref 11.5–15.5)
WBC: 7 10*3/uL (ref 4.0–10.5)

## 2017-03-09 LAB — COMPREHENSIVE METABOLIC PANEL
ALBUMIN: 3.8 g/dL (ref 3.5–5.0)
ALK PHOS: 84 U/L (ref 38–126)
ALT: 25 U/L (ref 14–54)
AST: 18 U/L (ref 15–41)
Anion gap: 8 (ref 5–15)
BILIRUBIN TOTAL: 0.3 mg/dL (ref 0.3–1.2)
BUN: 9 mg/dL (ref 6–20)
CO2: 28 mmol/L (ref 22–32)
CREATININE: 0.59 mg/dL (ref 0.44–1.00)
Calcium: 9.1 mg/dL (ref 8.9–10.3)
Chloride: 103 mmol/L (ref 101–111)
GFR calc Af Amer: >60 mL/min (ref >60)
GLUCOSE: 103 mg/dL — AB (ref 65–99)
Potassium: 3.2 mmol/L — ABNORMAL LOW (ref 3.5–5.1)
SODIUM: 139 mmol/L (ref 135–145)
TOTAL PROTEIN: 6.9 g/dL (ref 6.5–8.1)

## 2017-03-09 LAB — LIPASE, BLOOD: LIPASE: 23 U/L (ref 11–51)

## 2017-03-09 MED ORDER — ACETAMINOPHEN 325 MG PO TABS
650.0000 mg | ORAL_TABLET | Freq: Once | ORAL | Status: AC
  Administered 2017-03-09: 650 mg via ORAL
  Filled 2017-03-09: qty 2

## 2017-03-09 MED ORDER — SODIUM CHLORIDE 0.9 % IV BOLUS (SEPSIS)
1000.0000 mL | Freq: Once | INTRAVENOUS | Status: AC
  Administered 2017-03-09: 1000 mL via INTRAVENOUS

## 2017-03-09 MED ORDER — POTASSIUM CHLORIDE CRYS ER 20 MEQ PO TBCR
40.0000 meq | EXTENDED_RELEASE_TABLET | Freq: Once | ORAL | Status: AC
  Administered 2017-03-10: 40 meq via ORAL
  Filled 2017-03-09: qty 2

## 2017-03-09 NOTE — ED Provider Notes (Signed)
[facility] EMERGENCY DEPARTMENT Provider Note   CSN: [id number] Arrival date & time: 03/09/17  2112     History   Chief Complaint Chief Complaint  Patient presents with  . Diarrhea    HPI Lisa Ryan is a 41 y.o. female.  The history is provided by the patient and the spouse.  Diarrhea   This is a new problem. The current episode started more than 1 week ago. The problem occurs 5 to 10 times per day. The problem has been gradually worsening. The stool consistency is described as watery. There has been no fever. Associated symptoms include abdominal pain, chills, sweats and headaches. Pertinent negatives include no vomiting and no cough. She has tried anti-motility drugs for the symptoms. The treatment provided no relief.  Patient reports daily diarrhea for past month She has multiple episodes of watery diarrhea that is worsening No recent bloody bowel movements No vomiting She has mild abdominal pain No recent antibiotics No foreign travel No camping She has seen GI for colonoscopies before due to strong fam h/o colon CA Past Medical History:  Diagnosis Date  . Anemia   . Anxiety state, unspecified 10/12/2007  . CHEST PAIN, ATYPICAL 02/18/2010  . CHEST PAIN-PRECORDIAL 02/19/2010  . CHF (congestive heart failure) ([place])    During Pregnancy  . COPD (chronic obstructive pulmonary disease) ([place])   . FATIGUE 09/28/2007  . GASTROENTERITIS 10/03/2008  . GERD (gastroesophageal reflux disease)   . MIGRAINE HEADACHE 03/04/2010  . PEDAL EDEMA 02/18/2010  . PEPTIC ULCER DISEASE 09/28/2007  . 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 PONV (postoperative nausea and vomiting)   . SEIZURE DISORDER 09/28/2007  . Stroke ([place])   . Thyroid nodule   . Tobacco abuse   . WEIGHT GAIN 09/28/2007    Patient Active Problem List   Diagnosis Date Noted  . Abnormal uterine bleeding (AUB) on  12/08/2015  . 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  Cough 10/08/2013  . Bronchitis 10/05/2013  . Reactive airway disease 10/05/2013  . Former heavy tobacco smoker 10/05/2013  . Unspecified disorder of thyroid 04/18/2012  . Flushing 03/07/2012  . Atypical chest pain 03/07/2012  . Gastritis 07/06/2011  . Family hx of colon cancer 07/06/2011  . MIGRAINE HEADACHE 03/04/2010  . PEDAL EDEMA 02/18/2010  . Anxiety state, unspecified 10/12/2007  . PEPTIC ULCER DISEASE 09/28/2007  . SEIZURE DISORDER 09/28/2007  . WEIGHT GAIN 09/28/2007    Past Surgical History:  Procedure Laterality Date  . ANKLE SURGERY     left  . APPENDECTOMY    . CESAREAN SECTION     x3  . CHOLECYSTECTOMY    . KNEE ARTHROSCOPY     left  . MOUTH LESION EXCISIONAL BIOPSY  04/2011   pre-cancerous, pallet of mouth    OB History    No data available       Home Medications    Prior to Admission medications   Medication Sig Start Date End Date Taking? Authorizing Provider  DULoxetine HCl (CYMBALTA PO) Take by mouth.    [provider]  fluticasone-salmeterol (ADVAIR HFA) 115-21 MCG/ACT inhaler Inhale 2 puffs into the lungs 2 (two) times daily. Patient taking differently: Inhale 2 puffs into the lungs daily as needed (SOB).  10/30/13   Juanito Doom, MD  levalbuterol (XOPENEX) 0.63 MG/3ML nebulizer solution Take 3 mLs (0.63 mg total) by nebulization every 4 (four) hours as needed for wheezing or shortness of breath. 10/30/13   McQuaid,  Ronie Spies, MD  naproxen (NAPROSYN) 500 MG tablet TAKE 1 TABLET BY MOUTH TWICE DAILY WITH A MEAL 06/13/16   Carole Civil, MD  Spacer/Aero-Holding Chambers (AEROCHAMBER MV) inhaler Use as instructed 10/30/13   Juanito Doom, MD    Family History Family History  Problem Relation Age of Onset  . Cerebral aneurysm Mother   . Colon cancer Mother   . Heart disease Father   . Liver disease Father   . Diabetes Brother 1       died age 68  . Kidney disease Maternal Grandmother   . Colon cancer Maternal  Grandmother   . Colon cancer Maternal Aunt   . Diabetes Sister     Social History Social History   Tobacco Use  . Smoking status: Current Every Day Smoker    Packs/day: 1.00    Years: 20.00    Pack years: 20.00    Types: Cigarettes  . Smokeless tobacco: Never Used  . Tobacco comment: Gave pt sheet to quit smoking  Substance Use Topics  . Alcohol use: No  . Drug use: No     Allergies   Albuterol and Morphine   Review of Systems Review of Systems  Constitutional: Positive for chills and fatigue.  Respiratory: Negative for cough.   Gastrointestinal: Positive for abdominal pain and diarrhea. Negative for blood in stool and vomiting.  Genitourinary: Negative for dysuria.  Neurological: Positive for headaches.  All other systems reviewed and are negative.    Physical Exam Updated Vital Signs BP 115/86   Pulse 89   Temp 97.9 F (36.6 C)   Resp 18   Ht 1.676 m (5\' 6" )   Wt 72.6 kg (160 lb)   SpO2 100%   BMI 25.82 kg/m   Physical Exam CONSTITUTIONAL: Well developed/well nourished HEAD: Normocephalic/atraumatic EYES: EOMI/PERRL, no icterus ENMT: Mucous membranes moist NECK: supple no meningeal signs SPINE/BACK:entire spine nontender CV: S1/S2 noted, no murmurs/rubs/gallops noted LUNGS: Lungs are clear to auscultation bilaterally, no apparent distress ABDOMEN: soft, nontender, no rebound or guarding, bowel sounds noted throughout abdomen GU:no cva tenderness NEURO: Pt is awake/alert/appropriate, moves all extremitiesx4.  No facial droop.   EXTREMITIES: pulses normal/equal, full ROM SKIN: warm, color normal PSYCH: no abnormalities of mood noted, alert and oriented to situation   ED Treatments / Results  Labs (all labs ordered are listed, but only abnormal results are displayed) Labs Reviewed  COMPREHENSIVE METABOLIC PANEL - Abnormal; Notable for the following components:      Result Value   Potassium 3.2 (*)    Glucose, Bld 103 (*)    All other  components within normal limits  URINALYSIS, ROUTINE W REFLEX MICROSCOPIC - Abnormal; Notable for the following components:   APPearance HAZY (*)    Hgb urine dipstick SMALL (*)    Squamous Epithelial / LPF 6-30 (*)    All other components within normal limits  LIPASE, BLOOD  CBC    EKG  EKG Interpretation None       Radiology No results found.  Procedures Procedures   Medications Ordered in ED Medications  ciprofloxacin (CIPRO) tablet 500 mg (not administered)  metroNIDAZOLE (FLAGYL) tablet 500 mg (not administered)  sodium chloride 0.9 % bolus 1,000 mL (1,000 mLs Intravenous New Bag/Given 03/09/17 2325)  acetaminophen (TYLENOL) tablet 650 mg (650 mg Oral Given 03/09/17 2326)  potassium chloride SA (K-DUR,KLOR-CON) CR tablet 40 mEq (40 mEq Oral Given 03/10/17 0017)     Initial Impression / Assessment and Plan / ED  Course  I have reviewed the triage vital signs and the nursing notes.  Pertinent labs results that were available during my care of the patient were reviewed by me and considered in my medical decision making (see chart for details).     Pt in the ED with worsening diarrhea for past month Unclear etiology, as no travel and no obvious risk factors for cdif She is well appearing/nontoxic No vomiting After discussion, will start oral antibiotics for 7 days She is known to [facility], will refer back to them as she is supposed to have followup for colon CA screening We discussed strict ER return precautions   Final Clinical Impressions(s) / ED Diagnoses   Final diagnoses:  Diarrhea of presumed infectious origin  Hypokalemia    ED Discharge Orders        Ordered    ciprofloxacin (CIPRO) 500 MG tablet  2 times daily     03/10/17 0017    metroNIDAZOLE (FLAGYL) 500 MG tablet  2 times daily     03/10/17 0017       Ripley Fraise, MD 03/10/17 0025

## 2017-03-09 NOTE — ED Triage Notes (Signed)
Pt c/o diarrhea x one month, but states it has gotten worse over last couple of days.

## 2017-03-10 ENCOUNTER — Other Ambulatory Visit: Payer: Self-pay

## 2017-03-10 LAB — GASTROINTESTINAL PANEL BY PCR, STOOL (REPLACES STOOL CULTURE)

## 2017-03-10 LAB — C DIFFICILE QUICK SCREEN W PCR REFLEX
C DIFFICLE (CDIFF) ANTIGEN: NEGATIVE
C Diff interpretation: NOT DETECTED
C Diff toxin: NEGATIVE

## 2017-03-10 MED ORDER — METRONIDAZOLE 500 MG PO TABS
500.0000 mg | ORAL_TABLET | Freq: Once | ORAL | Status: AC
  Administered 2017-03-10: 500 mg via ORAL
  Filled 2017-03-10: qty 1

## 2017-03-10 MED ORDER — CIPROFLOXACIN HCL 250 MG PO TABS
500.0000 mg | ORAL_TABLET | Freq: Once | ORAL | Status: AC
  Administered 2017-03-10: 500 mg via ORAL
  Filled 2017-03-10: qty 2

## 2017-03-10 MED ORDER — METRONIDAZOLE 500 MG PO TABS: 500.0000 mg | ORAL_TABLET | Freq: Two times a day (BID) | ORAL | 0 refills | Status: DC

## 2017-03-10 MED ORDER — CIPROFLOXACIN HCL 500 MG PO TABS: 500.0000 mg | ORAL_TABLET | Freq: Two times a day (BID) | ORAL | 0 refills | Status: DC

## 2017-03-21 ENCOUNTER — Ambulatory Visit: Payer: No Typology Code available for payment source | Admitting: Physician Assistant

## 2018-04-08 ENCOUNTER — Emergency Department (HOSPITAL_COMMUNITY)
Admission: EM | Admit: 2018-04-08 | Discharge: 2018-04-09 | Disposition: A | Discharge status: Home / Self Care | Payer: Self-pay | Attending: Emergency Medicine | Admitting: Emergency Medicine

## 2018-04-08 ENCOUNTER — Emergency Department (HOSPITAL_COMMUNITY): Payer: Self-pay

## 2018-04-08 ENCOUNTER — Encounter (HOSPITAL_COMMUNITY): Payer: Self-pay | Admitting: Emergency Medicine

## 2018-04-08 DIAGNOSIS — F17210 Nicotine dependence, cigarettes, uncomplicated: Secondary | ICD-10-CM | POA: Insufficient documentation

## 2018-04-08 DIAGNOSIS — Z79899 Other long term (current) drug therapy: Secondary | ICD-10-CM | POA: Insufficient documentation

## 2018-04-08 DIAGNOSIS — R600 Localized edema: Secondary | ICD-10-CM | POA: Insufficient documentation

## 2018-04-08 DIAGNOSIS — R05 Cough: Secondary | ICD-10-CM | POA: Insufficient documentation

## 2018-04-08 DIAGNOSIS — R0602 Shortness of breath: Secondary | ICD-10-CM | POA: Insufficient documentation

## 2018-04-08 DIAGNOSIS — I509 Heart failure, unspecified: Secondary | ICD-10-CM | POA: Insufficient documentation

## 2018-04-08 DIAGNOSIS — J449 Chronic obstructive pulmonary disease, unspecified: Secondary | ICD-10-CM | POA: Insufficient documentation

## 2018-04-08 LAB — CBC WITH DIFFERENTIAL/PLATELET
ABS IMMATURE GRANULOCYTES: 0.07 10*3/uL (ref 0.00–0.07)
BASOS ABS: 0.1 10*3/uL (ref 0.0–0.1)
BASOS PCT: 0 %
Eosinophils Absolute: 0.1 10*3/uL (ref 0.0–0.5)
Eosinophils Relative: 1 %
HCT: 41.6 % (ref 36.0–46.0)
HEMOGLOBIN: 14 g/dL (ref 12.0–15.0)
Immature Granulocytes: 1 %
LYMPHS PCT: 14 %
Lymphs Abs: 1.9 10*3/uL (ref 0.7–4.0)
MCH: 33.1 pg (ref 26.0–34.0)
MCHC: 33.7 g/dL (ref 30.0–36.0)
MCV: 98.3 fL (ref 80.0–100.0)
Monocytes Absolute: 0.6 10*3/uL (ref 0.1–1.0)
Monocytes Relative: 5 %
NRBC: 0 % (ref 0.0–0.2)
Neutro Abs: 11.2 10*3/uL — ABNORMAL HIGH (ref 1.7–7.7)
Neutrophils Relative %: 79 %
Platelets: 379 10*3/uL (ref 150–400)
RBC: 4.23 MIL/uL (ref 3.87–5.11)
RDW: 12.7 % (ref 11.5–15.5)
WBC: 13.9 10*3/uL — AB (ref 4.0–10.5)

## 2018-04-08 LAB — BASIC METABOLIC PANEL
Anion gap: 13 (ref 5–15)
BUN: 7 mg/dL (ref 6–20)
CHLORIDE: 99 mmol/L (ref 98–111)
CO2: 22 mmol/L (ref 22–32)
CREATININE: 0.61 mg/dL (ref 0.44–1.00)
Calcium: 9.3 mg/dL (ref 8.9–10.3)
Glucose, Bld: 133 mg/dL — ABNORMAL HIGH (ref 70–99)
POTASSIUM: 3.6 mmol/L (ref 3.5–5.1)
SODIUM: 134 mmol/L — AB (ref 135–145)

## 2018-04-08 LAB — I-STAT TROPONIN, ED: Troponin i, poc: 0.01 ng/mL (ref 0.00–0.08)

## 2018-04-08 MED ORDER — LEVALBUTEROL HCL 0.63 MG/3ML IN NEBU
0.6300 mg | INHALATION_SOLUTION | Freq: Once | RESPIRATORY_TRACT | Status: AC
  Administered 2018-04-08: 0.63 mg via RESPIRATORY_TRACT
  Filled 2018-04-08: qty 3

## 2018-04-08 MED ORDER — METHYLPREDNISOLONE SODIUM SUCC 125 MG IJ SOLR
125.0000 mg | Freq: Once | INTRAMUSCULAR | Status: AC
  Administered 2018-04-08: 125 mg via INTRAVENOUS
  Filled 2018-04-08: qty 2

## 2018-04-08 NOTE — ED Provider Notes (Signed)
[facility] EMERGENCY DEPARTMENT Provider Note   CSN: [id number] Arrival date & time: 04/08/18  2129     History   Chief Complaint Chief Complaint  Patient presents with  . Shortness of Breath    HPI Lisa Ryan is a 42 y.o. female with a PMH of COPD and CHF presents with intermittent shortness of breath onset 3 days ago. Patient states she has had associated subjective fever, sweats, congestion, back pain due to constant coughing, and productive cough during this time. Patient describes sputum as yellow and worse than usual. Patient reports husband was sick recently. Patient states she has tried her inhaler of levoalbuterol with partial relief. Patient reports she was prescribed z pack and prednisone yesterday by PCP. Patient is followed by [facility] pulmonology. Patient states she lost her nebulizer after she moved a few months ago. Patient reports chest pressure and states she has had bilateral leg edema. Patient denies chest pain, nausea, vomiting, or abdominal pain. Patient denies any recent hospitalizations.  HPI  Past Medical History:  Diagnosis Date  . Anemia   . Anxiety state, unspecified 10/12/2007  . CHEST PAIN, ATYPICAL 02/18/2010  . CHEST PAIN-PRECORDIAL 02/19/2010  . CHF (congestive heart failure) ([place])    During Pregnancy  . COPD (chronic obstructive pulmonary disease) ([place])   . FATIGUE 09/28/2007  . GASTROENTERITIS 10/03/2008  . GERD (gastroesophageal reflux disease)   . MIGRAINE HEADACHE 03/04/2010  . PEDAL EDEMA 02/18/2010  . PEPTIC ULCER DISEASE 09/28/2007  . 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 PONV (postoperative nausea and vomiting)   . SEIZURE DISORDER 09/28/2007  . Stroke ([place])   . Thyroid nodule   . Tobacco abuse   . WEIGHT GAIN 09/28/2007    Patient Active Problem List   Diagnosis Date Noted  . Abnormal uterine bleeding (AUB) on  12/08/2015  . 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  Cough 10/08/2013  . Bronchitis 10/05/2013  . Reactive airway disease 10/05/2013  . Former heavy tobacco smoker 10/05/2013  . Unspecified disorder of thyroid 04/18/2012  . Flushing 03/07/2012  . Atypical chest pain 03/07/2012  . Gastritis 07/06/2011  . Family hx of colon cancer 07/06/2011  . MIGRAINE HEADACHE 03/04/2010  . PEDAL EDEMA 02/18/2010  . Anxiety state, unspecified 10/12/2007  . PEPTIC ULCER DISEASE 09/28/2007  . SEIZURE DISORDER 09/28/2007  . WEIGHT GAIN 09/28/2007    Past Surgical History:  Procedure Laterality Date  . ANKLE SURGERY     left  . APPENDECTOMY    . CESAREAN SECTION     x3  . CHOLECYSTECTOMY    . DILITATION & CURRETTAGE/HYSTROSCOPY WITH NOVASURE ABLATION N/A 12/26/2015   Procedure: DILATATION & CURETTAGE/HYSTEROSCOPY WITH ENDOMETRIAL ABLATION;  Surgeon: Jonnie Kind, MD;  Location: [facility];  Service: Gynecology;  Laterality: N/A;  . KNEE ARTHROSCOPY     left  . MOUTH LESION EXCISIONAL BIOPSY  04/2011   pre-cancerous, pallet of mouth     OB History   None      Home Medications    Prior to Admission medications   Medication Sig Start Date End Date Taking? Authorizing Provider  cholecalciferol (VITAMIN D3) 25 MCG (1000 UT) tablet Take 5,000 Units by mouth at bedtime.   Yes [provider]  DULoxetine (CYMBALTA) 60 MG capsule Take 60 mg by mouth at bedtime.   Yes [provider]  fluticasone-salmeterol (ADVAIR HFA) 115-21 MCG/ACT inhaler Inhale 2 puffs into the lungs 2 (two) times daily. Patient  taking differently: Inhale 2 puffs into the lungs daily as needed (SOB).  10/30/13  Yes Juanito Doom, MD  naproxen (NAPROSYN) 500 MG tablet TAKE 1 TABLET BY MOUTH TWICE DAILY WITH A MEAL Patient taking differently: Take 500 mg by mouth 2 (two) times daily as needed for mild pain.  06/13/16  Yes Carole Civil, MD  Spacer/Aero-Holding Chambers (AEROCHAMBER MV) inhaler Use as instructed 10/30/13  Yes Juanito Doom, MD    ciprofloxacin (CIPRO) 500 MG tablet Take 1 tablet (500 mg total) 2 (two) times daily by mouth. One po bid x 7 days Patient not taking: Reported on 04/08/2018 03/10/17   Ripley Fraise, MD  DULoxetine HCl (CYMBALTA PO) Take by mouth.    [provider]  levalbuterol Penne Lash) 0.63 MG/3ML nebulizer solution Take 3 mLs (0.63 mg total) by nebulization every 8 (eight) hours as needed for wheezing or shortness of breath. 04/09/18   Darlin Drop P, PA-C  metroNIDAZOLE (FLAGYL) 500 MG tablet Take 1 tablet (500 mg total) 2 (two) times daily by mouth. One po bid x 7 days Patient not taking: Reported on 04/08/2018 03/10/17   Ripley Fraise, MD    Family History Family History  Problem Relation Age of Onset  . Cerebral aneurysm Mother   . Colon cancer Mother   . Heart disease Father   . Liver disease Father   . Diabetes Brother 1       died age 43  . Kidney disease Maternal Grandmother   . Colon cancer Maternal Grandmother   . Colon cancer Maternal Aunt   . Diabetes Sister     Social History Social History   Tobacco Use  . Smoking status: Current Every Day Smoker    Packs/day: 1.00    Years: 20.00    Pack years: 20.00    Types: Cigarettes  . Smokeless tobacco: Never Used  . Tobacco comment: Gave pt sheet to quit smoking  Substance Use Topics  . Alcohol use: No  . Drug use: No     Allergies   Albuterol and Morphine   Review of Systems Review of Systems  Constitutional: Positive for fever. Negative for activity change, appetite change, chills, diaphoresis, fatigue and unexpected weight change.  HENT: Positive for congestion and rhinorrhea. Negative for sore throat.   Respiratory: Positive for cough, chest tightness and shortness of breath. Negative for wheezing.   Cardiovascular: Positive for leg swelling. Negative for chest pain and palpitations.  Gastrointestinal: Negative for abdominal pain, nausea and vomiting.  Endocrine: Negative for cold intolerance and heat  intolerance.  Musculoskeletal: Positive for back pain.  Skin: Negative for rash.  Allergic/Immunologic: Negative for immunocompromised state.  Neurological: Negative for dizziness, syncope, weakness and light-headedness.  Psychiatric/Behavioral: Negative for agitation and behavioral problems. The patient is not nervous/anxious.      Physical Exam Updated Vital Signs BP 125/75   Pulse 93   Temp 98.4 F (36.9 C) (Oral)   Resp (!) 21   SpO2 99%   Physical Exam  Constitutional: She appears well-developed and well-nourished. No distress.  HENT:  Head: Normocephalic and atraumatic.  Nose: Mucosal edema present.  Mouth/Throat: Uvula is midline and oropharynx is clear and moist.  Neck: Normal range of motion. Neck supple. No JVD present.  Cardiovascular: Normal rate, regular rhythm, normal heart sounds, intact distal pulses and normal pulses. Exam reveals no gallop and no friction rub.  No murmur heard. Pulses:      Radial pulses are 2+ on the right side,  and 2+ on the left side.       Dorsalis pedis pulses are 2+ on the right side, and 2+ on the left side.  Pulmonary/Chest: Effort normal. No respiratory distress. She has wheezes in the right upper field, the right middle field, the right lower field, the left upper field, the left middle field and the left lower field. She has no rales. She exhibits no tenderness.  Abdominal: Soft. There is no tenderness.  Musculoskeletal: Normal range of motion.       Right lower leg: She exhibits no tenderness and no edema.       Left lower leg: She exhibits no tenderness and no edema.  Neurological: She is alert.  Skin: Skin is warm. Capillary refill takes less than 2 seconds. No rash noted. She is not diaphoretic. No pallor.  Psychiatric: She has a normal mood and affect.  Nursing note and vitals reviewed.    ED Treatments / Results  Labs (all labs ordered are listed, but only abnormal results are displayed) Labs Reviewed  CBC WITH  DIFFERENTIAL/PLATELET - Abnormal; Notable for the following components:      Result Value   WBC 13.9 (*)    Neutro Abs 11.2 (*)    All other components within normal limits  BASIC METABOLIC PANEL - Abnormal; Notable for the following components:   Sodium 134 (*)    Glucose, Bld 133 (*)    All other components within normal limits  I-STAT TROPONIN, ED    EKG EKG Interpretation  Date/Time:  Saturday April 08 2018 21:37:34 EST Ventricular Rate:  101 PR Interval:  118 QRS Duration: 70 QT Interval:  324 QTC Calculation: 420 R Axis:   73 Text Interpretation:  Sinus tachycardia Otherwise normal ECG No significant change since last tracing Confirmed by Blanchie Dessert ([id number]) on 04/08/2018 10:33:55 PM   Radiology Dg Chest 2 View  Result Date: 04/08/2018 CLINICAL DATA:  42 year old female with shortness of breath. EXAM: CHEST - 2 VIEW COMPARISON:  Chest radiograph dated 12/26/2015 FINDINGS: There is chronic bronchitic changes. No focal consolidation, pleural effusion, or pneumothorax. The cardiac silhouette is within normal limits. No acute osseous pathology. IMPRESSION: No active cardiopulmonary disease. Electronically Signed   By: Anner Crete M.D.   On: 04/08/2018 22:39    Procedures Procedures (including critical care time)  Medications Ordered in ED Medications  levalbuterol (XOPENEX) nebulizer solution 0.63 mg (0.63 mg Nebulization Given 04/08/18 2232)  methylPREDNISolone sodium succinate (SOLU-MEDROL) 125 mg/2 mL injection 125 mg (125 mg Intravenous Given 04/08/18 2234)  levalbuterol (XOPENEX) nebulizer solution 0.63 mg (0.63 mg Nebulization Given 04/08/18 2337)  levalbuterol (XOPENEX) nebulizer solution 0.63 mg (0.63 mg Nebulization Given 04/09/18 0022)     Initial Impression / Assessment and Plan / ED Course  I have reviewed the triage vital signs and the nursing notes.  Pertinent labs & imaging results that were available during my care of the patient were reviewed  by me and considered in my medical decision making (see chart for details).  Clinical Course as of Apr 09 122  Sat Apr 08, 2018  2306 No active cardiopulmonary disease present.   DG Chest 2 View [AH]  2316 Patient reports nebulizer treatment helped her symptoms, but wheezing is still present. Will provide another nebulizer treatment.    [AH]  2324 Leukocytosis noted at 13.9 likely due to recent infection. Patient has been prescribed antibiotics for COPD exacerbation by PCP.  WBC(!): 13.9 [AH]  2359 Patient reports symptoms have improved with  nebulizer treatments.    [AH]  Sun Apr 09, 2018  0050 Patient continues to have diffuse wheezing despite nebulizer treatments.    [AH]    Clinical Course User Index [AH] Arville Lime, PA-C   Suspect symptoms are likely due to COPD exacerbation due to history and physical exam. Work up is consistent with COPD exacerbation. Patient has improved with nebulizer treatment and steroids. Will prescribe levalbuterol for symptoms.  Patient was able to ambulate and maintain a normal O2 saturation, however, she had increased work of breathing with ambulation. Discussed the benefits of admission with patient due to her persistent shortness of breath, but patient has declined at this time. Instructed patient to return if symptoms are not improving in 24 hours.  Advised patient to continue z pack and steroids as prescribed by PCP. Patient is to be discharged with recommendation to follow up with PCP in regards to today's hospital visit. Chest pressure is not likely of cardiac or pulmonary etiology d/t presentation, VSS, no tracheal deviation, no JVD or new murmur, RRR, breath sounds equal bilaterally, EKG without acute abnormalities, negative troponin, and negative CXR. Heart pathway score is 1. Pt has been advised to return to the ED if CP becomes exertional, associated with diaphoresis or nausea, radiates to left jaw/arm, worsens or becomes concerning in any way. Pt  appears reliable for follow up and is agreeable to discharge.    Final Clinical Impressions(s) / ED Diagnoses   Final diagnoses:  SOB (shortness of breath)    ED Discharge Orders         Ordered    levalbuterol (XOPENEX) 0.63 MG/3ML nebulizer solution  Every 8 hours PRN,   Status:  Discontinued    Note to Pharmacy:  Albuterol causes palpitations and anxiety.   04/09/18 0002    levalbuterol (XOPENEX) 0.63 MG/3ML nebulizer solution  Every 8 hours PRN    Note to Pharmacy:  Please dispense 12 of the 55mL vials.   04/09/18 0115           Arville Lime, PA-C 22/97/98 9211    Delora Fuel, MD 94/17/40 [phone]

## 2018-04-08 NOTE — ED Triage Notes (Signed)
Pt c/o increased shortness of breath x 3 days. Hx COPD, current everyday smoker. Wheezing noted in all lobes. Refused albuterol treatment, states it sent her into vtach.

## 2018-04-09 ENCOUNTER — Encounter (HOSPITAL_COMMUNITY): Payer: Self-pay | Admitting: Emergency Medicine

## 2018-04-09 ENCOUNTER — Inpatient Hospital Stay (HOSPITAL_COMMUNITY)
Admission: EM | Admit: 2018-04-09 | Discharge: 2018-04-17 | DRG: 189 | Disposition: A | Discharge status: Home / Self Care | Payer: Self-pay | Attending: Internal Medicine | Admitting: Internal Medicine

## 2018-04-09 ENCOUNTER — Other Ambulatory Visit: Payer: Self-pay

## 2018-04-09 DIAGNOSIS — G43909 Migraine, unspecified, not intractable, without status migrainosus: Secondary | ICD-10-CM | POA: Diagnosis present

## 2018-04-09 DIAGNOSIS — F419 Anxiety disorder, unspecified: Secondary | ICD-10-CM | POA: Diagnosis present

## 2018-04-09 DIAGNOSIS — Z833 Family history of diabetes mellitus: Secondary | ICD-10-CM

## 2018-04-09 DIAGNOSIS — Z720 Tobacco use: Secondary | ICD-10-CM

## 2018-04-09 DIAGNOSIS — K219 Gastro-esophageal reflux disease without esophagitis: Secondary | ICD-10-CM | POA: Diagnosis present

## 2018-04-09 DIAGNOSIS — F17210 Nicotine dependence, cigarettes, uncomplicated: Secondary | ICD-10-CM | POA: Diagnosis present

## 2018-04-09 DIAGNOSIS — Z8673 Personal history of transient ischemic attack (TIA), and cerebral infarction without residual deficits: Secondary | ICD-10-CM

## 2018-04-09 DIAGNOSIS — J45901 Unspecified asthma with (acute) exacerbation: Secondary | ICD-10-CM | POA: Diagnosis present

## 2018-04-09 DIAGNOSIS — Z800 Family history of malignant neoplasm of digestive organs: Secondary | ICD-10-CM

## 2018-04-09 DIAGNOSIS — Z9049 Acquired absence of other specified parts of digestive tract: Secondary | ICD-10-CM

## 2018-04-09 DIAGNOSIS — Z8711 Personal history of peptic ulcer disease: Secondary | ICD-10-CM

## 2018-04-09 DIAGNOSIS — J441 Chronic obstructive pulmonary disease with (acute) exacerbation: Secondary | ICD-10-CM | POA: Diagnosis present

## 2018-04-09 DIAGNOSIS — J189 Pneumonia, unspecified organism: Secondary | ICD-10-CM

## 2018-04-09 DIAGNOSIS — J9601 Acute respiratory failure with hypoxia: Principal | ICD-10-CM

## 2018-04-09 DIAGNOSIS — Z9109 Other allergy status, other than to drugs and biological substances: Secondary | ICD-10-CM

## 2018-04-09 DIAGNOSIS — R0902 Hypoxemia: Secondary | ICD-10-CM

## 2018-04-09 DIAGNOSIS — B9789 Other viral agents as the cause of diseases classified elsewhere: Secondary | ICD-10-CM | POA: Diagnosis present

## 2018-04-09 DIAGNOSIS — Z8249 Family history of ischemic heart disease and other diseases of the circulatory system: Secondary | ICD-10-CM

## 2018-04-09 DIAGNOSIS — J9811 Atelectasis: Secondary | ICD-10-CM | POA: Diagnosis not present

## 2018-04-09 DIAGNOSIS — F411 Generalized anxiety disorder: Secondary | ICD-10-CM | POA: Diagnosis present

## 2018-04-09 DIAGNOSIS — Z7951 Long term (current) use of inhaled steroids: Secondary | ICD-10-CM

## 2018-04-09 DIAGNOSIS — G40909 Epilepsy, unspecified, not intractable, without status epilepticus: Secondary | ICD-10-CM | POA: Diagnosis present

## 2018-04-09 DIAGNOSIS — R0602 Shortness of breath: Secondary | ICD-10-CM

## 2018-04-09 DIAGNOSIS — Z885 Allergy status to narcotic agent status: Secondary | ICD-10-CM

## 2018-04-09 LAB — D-DIMER, QUANTITATIVE (NOT AT ARMC)

## 2018-04-09 LAB — I-STAT ARTERIAL BLOOD GAS, ED
Acid-base deficit: 4 mmol/L — ABNORMAL HIGH (ref 0.0–2.0)
Bicarbonate: 20 mmol/L (ref 20.0–28.0)
O2 Saturation: 98 %
TCO2: 21 mmol/L — ABNORMAL LOW (ref 22–32)
pCO2 arterial: 32.7 mmHg (ref 32.0–48.0)
pH, Arterial: 7.394 (ref 7.350–7.450)
pO2, Arterial: 98 mmHg (ref 83.0–108.0)

## 2018-04-09 LAB — BASIC METABOLIC PANEL
ANION GAP: 15 (ref 5–15)
BUN: 9 mg/dL (ref 6–20)
CHLORIDE: 101 mmol/L (ref 98–111)
CO2: 21 mmol/L — ABNORMAL LOW (ref 22–32)
Calcium: 9.8 mg/dL (ref 8.9–10.3)
Creatinine, Ser: 0.86 mg/dL (ref 0.44–1.00)
GFR calc Af Amer: >60 mL/min (ref >60)
GFR calc non Af Amer: >60 mL/min (ref >60)
Glucose, Bld: 183 mg/dL — ABNORMAL HIGH (ref 70–99)
Potassium: 3.6 mmol/L (ref 3.5–5.1)
Sodium: 137 mmol/L (ref 135–145)

## 2018-04-09 LAB — INFLUENZA PANEL BY PCR (TYPE A & B)
Influenza A By PCR: NEGATIVE
Influenza B By PCR: NEGATIVE

## 2018-04-09 LAB — HIV ANTIBODY (ROUTINE TESTING W REFLEX): HIV Screen 4th Generation wRfx: NONREACTIVE

## 2018-04-09 MED ORDER — LEVALBUTEROL HCL 0.63 MG/3ML IN NEBU
0.6300 mg | INHALATION_SOLUTION | Freq: Four times a day (QID) | RESPIRATORY_TRACT | Status: DC | PRN
  Administered 2018-04-09: 0.63 mg via RESPIRATORY_TRACT
  Filled 2018-04-09: qty 3

## 2018-04-09 MED ORDER — IPRATROPIUM BROMIDE 0.02 % IN SOLN: 0.5000 mg | RESPIRATORY_TRACT | Status: DC | PRN

## 2018-04-09 MED ORDER — LEVALBUTEROL HCL 1.25 MG/0.5ML IN NEBU
1.2500 mg | INHALATION_SOLUTION | RESPIRATORY_TRACT | Status: DC
  Administered 2018-04-10 – 2018-04-12 (×15): 1.25 mg via RESPIRATORY_TRACT
  Filled 2018-04-09 (×15): qty 0.5

## 2018-04-09 MED ORDER — METHYLPREDNISOLONE SODIUM SUCC 125 MG IJ SOLR
60.0000 mg | Freq: Four times a day (QID) | INTRAMUSCULAR | Status: DC
  Administered 2018-04-09 – 2018-04-11 (×9): 60 mg via INTRAVENOUS
  Filled 2018-04-09 (×9): qty 2

## 2018-04-09 MED ORDER — MOMETASONE FURO-FORMOTEROL FUM 200-5 MCG/ACT IN AERO
2.0000 | INHALATION_SPRAY | Freq: Two times a day (BID) | RESPIRATORY_TRACT | Status: DC
  Administered 2018-04-09 – 2018-04-17 (×14): 2 via RESPIRATORY_TRACT
  Filled 2018-04-09: qty 8.8

## 2018-04-09 MED ORDER — LEVALBUTEROL HCL 0.63 MG/3ML IN NEBU
0.6300 mg | INHALATION_SOLUTION | Freq: Once | RESPIRATORY_TRACT | Status: AC
  Administered 2018-04-09: 0.63 mg via RESPIRATORY_TRACT
  Filled 2018-04-09: qty 3

## 2018-04-09 MED ORDER — ACETAMINOPHEN 650 MG RE SUPP
650.0000 mg | Freq: Four times a day (QID) | RECTAL | Status: DC | PRN
  Administered 2018-04-09: 650 mg via RECTAL
  Filled 2018-04-09: qty 1

## 2018-04-09 MED ORDER — LORAZEPAM 2 MG/ML IJ SOLN
1.0000 mg | Freq: Once | INTRAMUSCULAR | Status: AC
  Administered 2018-04-09: 1 mg via INTRAVENOUS
  Filled 2018-04-09: qty 1

## 2018-04-09 MED ORDER — ENOXAPARIN SODIUM 40 MG/0.4ML ~~LOC~~ SOLN
40.0000 mg | Freq: Every day | SUBCUTANEOUS | Status: DC
  Administered 2018-04-09 – 2018-04-16 (×8): 40 mg via SUBCUTANEOUS
  Filled 2018-04-09 (×10): qty 0.4

## 2018-04-09 MED ORDER — LEVALBUTEROL HCL 1.25 MG/0.5ML IN NEBU
5.0000 mg | INHALATION_SOLUTION | Freq: Once | RESPIRATORY_TRACT | Status: AC
  Administered 2018-04-09: 5 mg via RESPIRATORY_TRACT
  Filled 2018-04-09: qty 2

## 2018-04-09 MED ORDER — SODIUM CHLORIDE 0.9% FLUSH
3.0000 mL | INTRAVENOUS | Status: DC | PRN
  Administered 2018-04-10: 3 mL via INTRAVENOUS
  Filled 2018-04-09: qty 3

## 2018-04-09 MED ORDER — LORAZEPAM 2 MG/ML IJ SOLN
0.5000 mg | Freq: Four times a day (QID) | INTRAMUSCULAR | Status: AC | PRN
  Administered 2018-04-09 (×2): 0.5 mg via INTRAVENOUS
  Filled 2018-04-09 (×2): qty 1

## 2018-04-09 MED ORDER — MAGNESIUM SULFATE 2 GM/50ML IV SOLN
2.0000 g | Freq: Once | INTRAVENOUS | Status: AC
  Administered 2018-04-09: 2 g via INTRAVENOUS
  Filled 2018-04-09: qty 50

## 2018-04-09 MED ORDER — SODIUM CHLORIDE 0.9% FLUSH
3.0000 mL | Freq: Two times a day (BID) | INTRAVENOUS | Status: DC
  Administered 2018-04-09 – 2018-04-17 (×17): 3 mL via INTRAVENOUS

## 2018-04-09 MED ORDER — LORAZEPAM 2 MG/ML IJ SOLN
0.5000 mg | Freq: Four times a day (QID) | INTRAMUSCULAR | Status: DC | PRN
  Administered 2018-04-09: 0.5 mg via INTRAVENOUS
  Filled 2018-04-09: qty 1

## 2018-04-09 MED ORDER — ONDANSETRON HCL 4 MG PO TABS: 4.0000 mg | ORAL_TABLET | Freq: Four times a day (QID) | ORAL | Status: DC | PRN

## 2018-04-09 MED ORDER — IPRATROPIUM-ALBUTEROL 0.5-2.5 (3) MG/3ML IN SOLN
3.0000 mL | RESPIRATORY_TRACT | Status: DC
  Administered 2018-04-09 (×4): 3 mL via RESPIRATORY_TRACT
  Filled 2018-04-09 (×4): qty 3

## 2018-04-09 MED ORDER — NICOTINE 21 MG/24HR TD PT24
21.0000 mg | MEDICATED_PATCH | Freq: Every day | TRANSDERMAL | Status: DC
  Administered 2018-04-09 – 2018-04-17 (×9): 21 mg via TRANSDERMAL
  Filled 2018-04-09 (×9): qty 1

## 2018-04-09 MED ORDER — DULOXETINE HCL 60 MG PO CPEP
60.0000 mg | ORAL_CAPSULE | Freq: Every day | ORAL | Status: DC
  Administered 2018-04-10 – 2018-04-16 (×7): 60 mg via ORAL
  Filled 2018-04-09 (×9): qty 1

## 2018-04-09 MED ORDER — LEVALBUTEROL HCL 0.63 MG/3ML IN NEBU: 0.6300 mg | INHALATION_SOLUTION | Freq: Three times a day (TID) | RESPIRATORY_TRACT | 0 refills | Status: DC | PRN

## 2018-04-09 MED ORDER — SODIUM CHLORIDE 0.9 % IV SOLN: 250.0000 mL | INTRAVENOUS | Status: DC | PRN

## 2018-04-09 MED ORDER — LEVALBUTEROL HCL 0.63 MG/3ML IN NEBU: 0.6300 mg | INHALATION_SOLUTION | Freq: Three times a day (TID) | RESPIRATORY_TRACT | 5 refills | Status: DC | PRN

## 2018-04-09 MED ORDER — ACETAMINOPHEN 325 MG PO TABS
650.0000 mg | ORAL_TABLET | Freq: Four times a day (QID) | ORAL | Status: DC | PRN
  Administered 2018-04-09 – 2018-04-14 (×6): 650 mg via ORAL
  Filled 2018-04-09 (×7): qty 2

## 2018-04-09 MED ORDER — SODIUM CHLORIDE 0.9% FLUSH
3.0000 mL | Freq: Two times a day (BID) | INTRAVENOUS | Status: DC
  Administered 2018-04-09 – 2018-04-17 (×9): 3 mL via INTRAVENOUS

## 2018-04-09 MED ORDER — SENNOSIDES-DOCUSATE SODIUM 8.6-50 MG PO TABS: 1.0000 | ORAL_TABLET | Freq: Every evening | ORAL | Status: DC | PRN

## 2018-04-09 MED ORDER — ONDANSETRON HCL 4 MG/2ML IJ SOLN
4.0000 mg | Freq: Four times a day (QID) | INTRAMUSCULAR | Status: DC | PRN
  Administered 2018-04-11: 4 mg via INTRAVENOUS
  Filled 2018-04-09: qty 2

## 2018-04-09 NOTE — ED Notes (Signed)
RT at bedside placing pt on BIPAP with continuous neb.

## 2018-04-09 NOTE — ED Provider Notes (Signed)
[place] EMERGENCY DEPARTMENT Provider Note   CSN: [id number] Arrival date & time: 04/09/18  0214     History   Chief Complaint Chief Complaint  Patient presents with  . Shortness of Breath  . Cough    HPI Ave Ethelle Lyon is a 42 y.o. female with a PMH of COPD, presenting with worsening shortness of breath onset 3 days ago. Patient was evaluated at [facility] ER earlier tonight and advised to be admitted, however, patient declined and stated she would return if symptoms worsened. Patient states she was able to go to a restaurant, but symptoms worsened and she decided to return. Patient was given 3 nebulizer treatments and steroids while she was here tonight and continued to have wheezing. Patient reports symptoms are worse with ambulation and improve with a nebulizer. Patient reports a productive cough, chest tightness, wheezing, and shortness of breath. Patient denies chest pain.  HPI  Past Medical History:  Diagnosis Date  . Anemia   . Anxiety state, unspecified 10/12/2007  . CHEST PAIN, ATYPICAL 02/18/2010  . CHEST PAIN-PRECORDIAL 02/19/2010  . CHF (congestive heart failure) ([place])    During Pregnancy  . COPD (chronic obstructive pulmonary disease) ([place])   . FATIGUE 09/28/2007  . GASTROENTERITIS 10/03/2008  . GERD (gastroesophageal reflux disease)   . MIGRAINE HEADACHE 03/04/2010  . PEDAL EDEMA 02/18/2010  . PEPTIC ULCER DISEASE 09/28/2007  . 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 PONV (postoperative nausea and vomiting)   . SEIZURE DISORDER 09/28/2007  . Stroke ([place])   . Thyroid nodule   . Tobacco abuse   . WEIGHT GAIN 09/28/2007    Patient Active Problem List   Diagnosis Date Noted  . Asthma with acute exacerbation 04/09/2018  . Abnormal uterine bleeding (AUB) on  12/08/2015  . 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 Cough 10/08/2013  . Bronchitis 10/05/2013  . Reactive airway disease 10/05/2013  .  Former heavy tobacco smoker 10/05/2013  . Unspecified disorder of thyroid 04/18/2012  . [place] 03/07/2012  . Atypical chest pain 03/07/2012  . Gastritis 07/06/2011  . Family hx of colon cancer 07/06/2011  . MIGRAINE HEADACHE 03/04/2010  . PEDAL EDEMA 02/18/2010  . Anxiety 10/12/2007  . PEPTIC ULCER DISEASE 09/28/2007  . SEIZURE DISORDER 09/28/2007  . WEIGHT GAIN 09/28/2007    Past Surgical History:  Procedure Laterality Date  . ANKLE SURGERY     left  . APPENDECTOMY    . CESAREAN SECTION     x3  . CHOLECYSTECTOMY    . DILITATION & CURRETTAGE/HYSTROSCOPY WITH NOVASURE ABLATION N/A 12/26/2015   Procedure: DILATATION & CURETTAGE/HYSTEROSCOPY WITH ENDOMETRIAL ABLATION;  Surgeon: Jonnie Kind, MD;  Location: [facility];  Service: Gynecology;  Laterality: N/A;  . KNEE ARTHROSCOPY     left  . MOUTH LESION EXCISIONAL BIOPSY  04/2011   pre-cancerous, pallet of mouth     OB History   None      Home Medications    Prior to Admission medications   Medication Sig Start Date End Date Taking? Authorizing Provider  cholecalciferol (VITAMIN D3) 25 MCG (1000 UT) tablet Take 5,000 Units by mouth at bedtime.    [provider]  ciprofloxacin (CIPRO) 500 MG tablet Take 1 tablet (500 mg total) 2 (two) times daily by mouth. One po bid x 7 days Patient not taking: Reported on 04/08/2018 03/10/17   Ripley Fraise, MD  DULoxetine (CYMBALTA) 60 MG capsule Take 60 mg by mouth at  bedtime.    [provider]  DULoxetine HCl (CYMBALTA PO) Take by mouth.    [provider]  fluticasone-salmeterol (ADVAIR HFA) 115-21 MCG/ACT inhaler Inhale 2 puffs into the lungs 2 (two) times daily. Patient taking differently: Inhale 2 puffs into the lungs daily as needed (SOB).  10/30/13   Juanito Doom, MD  levalbuterol (XOPENEX) 0.63 MG/3ML nebulizer solution Take 3 mLs (0.63 mg total) by nebulization every 8 (eight) hours as needed for wheezing or shortness of breath. 04/09/18    Darlin Drop P, PA-C  metroNIDAZOLE (FLAGYL) 500 MG tablet Take 1 tablet (500 mg total) 2 (two) times daily by mouth. One po bid x 7 days Patient not taking: Reported on 04/08/2018 03/10/17   Ripley Fraise, MD  naproxen (NAPROSYN) 500 MG tablet TAKE 1 TABLET BY MOUTH TWICE DAILY WITH A MEAL Patient taking differently: Take 500 mg by mouth 2 (two) times daily as needed for mild pain.  06/13/16   Carole Civil, MD  Spacer/Aero-Holding Chambers (AEROCHAMBER MV) inhaler Use as instructed 10/30/13   Juanito Doom, MD    Family History Family History  Problem Relation Age of Onset  . Cerebral aneurysm Mother   . Colon cancer Mother   . Heart disease Father   . Liver disease Father   . Diabetes Brother 1       died age 71  . Kidney disease Maternal Grandmother   . Colon cancer Maternal Grandmother   . Colon cancer Maternal Aunt   . Diabetes Sister     Social History Social History   Tobacco Use  . Smoking status: Current Every Day Smoker    Packs/day: 1.00    Years: 20.00    Pack years: 20.00    Types: Cigarettes  . Smokeless tobacco: Never Used  . Tobacco comment: Gave pt sheet to quit smoking  Substance Use Topics  . Alcohol use: No  . Drug use: No     Allergies   Albuterol and Morphine   Review of Systems Review of Systems  Constitutional: Negative for chills, diaphoresis and fever.  Respiratory: Positive for cough, chest tightness, shortness of breath and wheezing.   Cardiovascular: Negative for chest pain.  Gastrointestinal: Negative for abdominal pain, nausea and vomiting.  Endocrine: Negative for cold intolerance and heat intolerance.  Genitourinary: Negative for dysuria.  Musculoskeletal: Negative for back pain.  Skin: Negative for rash.  Allergic/Immunologic: Negative for immunocompromised state.  Neurological: Negative for dizziness, weakness and light-headedness.  Hematological: Negative for adenopathy.     Physical Exam Updated Vital  Signs BP 116/77   Pulse 87   Temp (!) 97.5 F (36.4 C) (Oral)   Resp (!) 32   SpO2 100%   Physical Exam  Constitutional: She appears well-developed and well-nourished. No distress.  HENT:  Head: Normocephalic and atraumatic.  Cardiovascular: Normal rate, regular rhythm and normal heart sounds. Exam reveals no gallop and no friction rub.  No murmur heard. Pulmonary/Chest: She is in respiratory distress (Pt is able to speak in 1-2 word phrases. ). She has wheezes in the right upper field, the right middle field, the right lower field, the left upper field, the left middle field and the left lower field. She has no rales.  Abdominal: Soft. There is no tenderness.  Musculoskeletal: Normal range of motion.       Right lower leg: Normal. She exhibits no tenderness and no edema.       Left lower leg: Normal. She exhibits no  tenderness and no edema.  Neurological: She is alert.  Skin: No rash noted. She is not diaphoretic. No erythema.  Psychiatric: She has a normal mood and affect.  Nursing note and vitals reviewed.    ED Treatments / Results  Labs (all labs ordered are listed, but only abnormal results are displayed) Labs Reviewed  HIV ANTIBODY (ROUTINE TESTING W REFLEX)  BASIC METABOLIC PANEL    EKG None  Radiology Dg Chest 2 View  Result Date: 04/08/2018 CLINICAL DATA:  42 year old female with shortness of breath. EXAM: CHEST - 2 VIEW COMPARISON:  Chest radiograph dated 12/26/2015 FINDINGS: There is chronic bronchitic changes. No focal consolidation, pleural effusion, or pneumothorax. The cardiac silhouette is within normal limits. No acute osseous pathology. IMPRESSION: No active cardiopulmonary disease. Electronically Signed   By: Anner Crete M.D.   On: 04/08/2018 22:39    Procedures Procedures (including critical care time)  Medications Ordered in ED Medications  mometasone-formoterol (DULERA) 200-5 MCG/ACT inhaler 2 puff (has no administration in time range)   levalbuterol (XOPENEX) nebulizer solution 0.63 mg (has no administration in time range)  enoxaparin (LOVENOX) injection 40 mg (has no administration in time range)  sodium chloride flush (NS) 0.9 % injection 3 mL (has no administration in time range)  sodium chloride flush (NS) 0.9 % injection 3 mL (has no administration in time range)  sodium chloride flush (NS) 0.9 % injection 3 mL (has no administration in time range)  0.9 %  sodium chloride infusion (has no administration in time range)  acetaminophen (TYLENOL) tablet 650 mg (has no administration in time range)    Or  acetaminophen (TYLENOL) suppository 650 mg (has no administration in time range)  ondansetron (ZOFRAN) tablet 4 mg (has no administration in time range)    Or  ondansetron (ZOFRAN) injection 4 mg (has no administration in time range)  senna-docusate (Senokot-S) tablet 1 tablet (has no administration in time range)  magnesium sulfate IVPB 2 g 50 mL (has no administration in time range)  methylPREDNISolone sodium succinate (SOLU-MEDROL) 125 mg/2 mL injection 60 mg (has no administration in time range)  ipratropium (ATROVENT) nebulizer solution 0.5 mg (has no administration in time range)  levalbuterol (XOPENEX) nebulizer solution 5 mg (5 mg Nebulization Given 04/09/18 0326)  LORazepam (ATIVAN) injection 1 mg (1 mg Intravenous Given 04/09/18 0344)     Initial Impression / Assessment and Plan / ED Course  I have reviewed the triage vital signs and the nursing notes.  Pertinent labs & imaging results that were available during my care of the patient were reviewed by me and considered in my medical decision making (see chart for details).    Patient presents with shortness of breath. Patient will require admission due to respiratory distress despite multiple nebulizer treatments.  Provided bipap machine and an additional Xopenex nebulizer treatment. Will consult hospitalist for admission. Hospitalist has agreed to admit  patient.   CRITICAL CARE Performed by: Veteran  Total critical care time: 49 minutes   Critical care time was exclusive of separately billable procedures and treating other patients.  Critical care was necessary to treat or prevent imminent or life-threatening deterioration.  Critical care was time spent personally by me on the following activities: development of treatment plan with patient and/or surrogate as well as nursing, discussions with consultants, evaluation of patient's response to treatment, examination of patient, obtaining history from patient or surrogate, ordering and performing treatments and interventions, ordering and review of laboratory studies, ordering and review of  radiographic studies, pulse oximetry and re-evaluation of patient's condition.  Final Clinical Impressions(s) / ED Diagnoses   Final diagnoses:  Shortness of breath    ED Discharge Orders    None       Arville Lime, [place] 04/09/18 0348    Merryl Hacker, MD 04/09/18 [phone]

## 2018-04-09 NOTE — ED Notes (Signed)
Delay in lab draw,  MD currently at bedside.

## 2018-04-09 NOTE — ED Notes (Signed)
Nursing station telemetry showing SVT rhythm at 215 bpm, no interventions required prior to patient converting back to NSR at 90. Pt in NAD. Admitting paged and updated, no orders received. Will notify if happens again.

## 2018-04-09 NOTE — ED Notes (Signed)
Hospitalist at bedside 

## 2018-04-09 NOTE — ED Notes (Signed)
Pt refused Wheelchair and refused to stay after repeated education.

## 2018-04-09 NOTE — H&P (Signed)
History and Physical    ABBAGALE GOGUEN QAS:[id number] DOB: 02-19-1976 DOA: 04/09/2018  PCP: Doree Albee, MD   Patient coming from: Home   Chief Complaint: SOB, productive cough, wheeze, subjective fever, nasal congestion   HPI: Lisa Ryan is a 42 y.o. female with medical history significant for asthma, anxiety, and self-reported CHF, presenting to the emergency department for evaluation of shortness of breath and wheezing.  Patient reports that she had been in her usual state of health until approximately 4 days ago when she developed some mild malaise and nasal congestion.  Her husband had been experiencing similar symptoms just prior to this.  Over the ensuing days, her condition worsened, particularly with shortness of breath, productive cough, and wheezing.  She had PFTs a few years ago that were consistent with asthma and has been taking Advair daily with as needed Xopenex.  She continues to smoke a pack a day, but wants to try cutting back.  Her husband has already quit smoking.  She reports some chest tightness, but no pain per se.  She recently traveled, but was only in the car for four hours.  She did have some nontraumatic left knee pain after that which has since resolved.  She was seen in the emergency department last night for the symptoms, improved some with neb treatments and steroids; ED physician recommended admission at that time, but the patient felt well enough to go home and left.  She returned within a couple hours after her condition worsened again.  ED Course: Upon arrival to the ED, patient is found to be afebrile, saturating adequately on room air, tachypneic in the 30s, tachycardic in the 110s, and with stable blood pressure.  EKG features a sinus tachycardia with rate 101.  Chest x-ray is negative for acute cardiopulmonary disease.  Chemistry panel is notable for slight hyponatremia.  CBC features a leukocytosis to 13,900.  Troponin is normal.  Patient was given  additional Xopenex nebs, placed on BiPAP, was given a milligram of IV Ativan to help her tolerate the BiPAP, has remained hemodynamically stable, but remains dyspneic at rest and will be observed in the hospital for ongoing evaluation and treatment.  Review of Systems:  All other systems reviewed and apart from HPI, are negative.  Past Medical History:  Diagnosis Date  . Anemia   . Anxiety state, unspecified 10/12/2007  . CHEST PAIN, ATYPICAL 02/18/2010  . CHEST PAIN-PRECORDIAL 02/19/2010  . CHF (congestive heart failure) ([place])    During Pregnancy  . COPD (chronic obstructive pulmonary disease) ([place])   . FATIGUE 09/28/2007  . GASTROENTERITIS 10/03/2008  . GERD (gastroesophageal reflux disease)   . MIGRAINE HEADACHE 03/04/2010  . PEDAL EDEMA 02/18/2010  . PEPTIC ULCER DISEASE 09/28/2007  . 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 PONV (postoperative nausea and vomiting)   . SEIZURE DISORDER 09/28/2007  . Stroke ([place])   . Thyroid nodule   . Tobacco abuse   . WEIGHT GAIN 09/28/2007    Past Surgical History:  Procedure Laterality Date  . ANKLE SURGERY     left  . APPENDECTOMY    . CESAREAN SECTION     x3  . CHOLECYSTECTOMY    . DILITATION & CURRETTAGE/HYSTROSCOPY WITH NOVASURE ABLATION N/A 12/26/2015   Procedure: DILATATION & CURETTAGE/HYSTEROSCOPY WITH ENDOMETRIAL ABLATION;  Surgeon: Jonnie Kind, MD;  Location: [facility];  Service: Gynecology;  Laterality: N/A;  . KNEE ARTHROSCOPY     left  . MOUTH LESION EXCISIONAL BIOPSY  04/2011   pre-cancerous, pallet of mouth     reports that she has been smoking cigarettes. She has a 20.00 pack-year smoking history. She has never used smokeless tobacco. She reports that she does not drink alcohol or use drugs.  Allergies  Allergen Reactions  . Albuterol Anxiety and Other (See Comments)    Increased HR 130s  . Morphine Other (See Comments)    hallucinations    Family History  Problem Relation Age of Onset  .  Cerebral aneurysm Mother   . Colon cancer Mother   . Heart disease Father   . Liver disease Father   . Diabetes Brother 1       died age 64  . Kidney disease Maternal Grandmother   . Colon cancer Maternal Grandmother   . Colon cancer Maternal Aunt   . Diabetes Sister      Prior to Admission medications   Medication Sig Start Date End Date Taking? Authorizing Provider  cholecalciferol (VITAMIN D3) 25 MCG (1000 UT) tablet Take 5,000 Units by mouth at bedtime.    [provider]  ciprofloxacin (CIPRO) 500 MG tablet Take 1 tablet (500 mg total) 2 (two) times daily by mouth. One po bid x 7 days Patient not taking: Reported on 04/08/2018 03/10/17   Ripley Fraise, MD  DULoxetine (CYMBALTA) 60 MG capsule Take 60 mg by mouth at bedtime.    [provider]  DULoxetine HCl (CYMBALTA PO) Take by mouth.    [provider]  fluticasone-salmeterol (ADVAIR HFA) 115-21 MCG/ACT inhaler Inhale 2 puffs into the lungs 2 (two) times daily. Patient taking differently: Inhale 2 puffs into the lungs daily as needed (SOB).  10/30/13   Juanito Doom, MD  levalbuterol (XOPENEX) 0.63 MG/3ML nebulizer solution Take 3 mLs (0.63 mg total) by nebulization every 8 (eight) hours as needed for wheezing or shortness of breath. 04/09/18   Darlin Drop P, PA-C  metroNIDAZOLE (FLAGYL) 500 MG tablet Take 1 tablet (500 mg total) 2 (two) times daily by mouth. One po bid x 7 days Patient not taking: Reported on 04/08/2018 03/10/17   Ripley Fraise, MD  naproxen (NAPROSYN) 500 MG tablet TAKE 1 TABLET BY MOUTH TWICE DAILY WITH A MEAL Patient taking differently: Take 500 mg by mouth 2 (two) times daily as needed for mild pain.  06/13/16   Carole Civil, MD  Spacer/Aero-Holding Chambers (AEROCHAMBER MV) inhaler Use as instructed 10/30/13   Juanito Doom, MD    Physical Exam: Vitals:   04/09/18 5885 04/09/18 0327 04/09/18 0328 04/09/18 0331  BP: 108/77   116/77  Pulse: 94  87 87  Resp: 20   (!) 31 (!) 32  Temp: (!) 97.5 F (36.4 C)     TempSrc: Oral     SpO2: 96% 100% 100% 100%    Constitutional: tachypnea, dyspnea with speech, no pallor, no diaphoresis  Eyes: PERTLA, lids and conjunctivae normal ENMT: Mucous membranes are moist. Posterior pharynx clear of any exudate or lesions.   Neck: normal, supple, no masses, no thyromegaly Respiratory: Symmetric chest expansion, no pallor or cyanosis. Tachypneic, dyspneic with speech. Prolonged expiratory phase with diffuse wheezes.    Cardiovascular: S1 & S2 heard, regular rate and rhythm. No extremity edema.   Abdomen: No distension, no tenderness, soft. Bowel sounds normal.  Musculoskeletal: no clubbing / cyanosis. No joint deformity upper and lower extremities.   Skin: no significant rashes, lesions, ulcers. Warm, dry, well-perfused. Neurologic: No facial asymmetry. Sensation intact. Moving all extremities.  Psychiatric: Alert and oriented x 3. Calm, cooperative.    Labs on Admission: I have personally reviewed following labs and imaging studies  CBC: Recent Labs  Lab 04/08/18 2246  WBC 13.9*  NEUTROABS 11.2*  HGB 14.0  HCT 41.6  MCV 98.3  PLT 097   Basic Metabolic Panel: Recent Labs  Lab 04/08/18 2246  NA 134*  K 3.6  CL 99  CO2 22  GLUCOSE 133*  BUN 7  CREATININE 0.61  CALCIUM 9.3   GFR: CrCl cannot be calculated (Unknown ideal weight.). Liver Function Tests: No results for input(s): AST, ALT, ALKPHOS, BILITOT, PROT, ALBUMIN in the last 168 hours. No results for input(s): LIPASE, AMYLASE in the last 168 hours. No results for input(s): AMMONIA in the last 168 hours. Coagulation Profile: No results for input(s): INR, PROTIME in the last 168 hours. Cardiac Enzymes: No results for input(s): CKTOTAL, CKMB, CKMBINDEX, TROPONINI in the last 168 hours. BNP (last 3 results) No results for input(s): PROBNP in the last 8760 hours. HbA1C: No results for input(s): HGBA1C in the last 72 hours. CBG: No results  for input(s): GLUCAP in the last 168 hours. Lipid Profile: No results for input(s): CHOL, HDL, LDLCALC, TRIG, CHOLHDL, LDLDIRECT in the last 72 hours. Thyroid Function Tests: No results for input(s): TSH, T4TOTAL, FREET4, T3FREE, THYROIDAB in the last 72 hours. Anemia Panel: No results for input(s): VITAMINB12, FOLATE, FERRITIN, TIBC, IRON, RETICCTPCT in the last 72 hours. Urine analysis:    Component Value Date/Time   COLORURINE YELLOW 03/09/2017 2300   APPEARANCEUR HAZY (A) 03/09/2017 2300   LABSPEC 1.017 03/09/2017 2300   PHURINE 5.0 03/09/2017 2300   GLUCOSEU NEGATIVE 03/09/2017 2300   HGBUR SMALL (A) 03/09/2017 2300   BILIRUBINUR NEGATIVE 03/09/2017 2300   BILIRUBINUR neg 11/03/2011 1039   KETONESUR NEGATIVE 03/09/2017 2300   PROTEINUR NEGATIVE 03/09/2017 2300   UROBILINOGEN 0.2 04/15/2014 1146   NITRITE NEGATIVE 03/09/2017 2300   LEUKOCYTESUR NEGATIVE 03/09/2017 2300   Sepsis Labs: @LABRCNTIP (procalcitonin:4,lacticidven:4) )No results found for this or any previous visit (from the past 240 hour(s)).   Radiological Exams on Admission: Dg Chest 2 View  Result Date: 04/08/2018 CLINICAL DATA:  42 year old female with shortness of breath. EXAM: CHEST - 2 VIEW COMPARISON:  Chest radiograph dated 12/26/2015 FINDINGS: There is chronic bronchitic changes. No focal consolidation, pleural effusion, or pneumothorax. The cardiac silhouette is within normal limits. No acute osseous pathology. IMPRESSION: No active cardiopulmonary disease. Electronically Signed   By: Anner Crete M.D.   On: 04/08/2018 22:39    EKG: Independently reviewed. Sinus tachycardia (rate 101).   Assessment/Plan   1. Asthma with acute exacerbation  - Presents in respiratory distress after 3-4 days worsening cough and wheezing that was preceded by viral URI sxs  - She recently traveled, had some left knee pain/stiffness after riding in the car, but no calf tenderness or swelling now and presentation  consistent with asthma exacerbation precipitated by viral URI  - She was treated with 125 mg IV Solu-Medrol and Xopenex (she reports intolerance of albuterol) nebs in ED, started on BiPAP  - PFT's in 2015 were consistent with asthma and she will be given IV magnesium, continued on systemic steroids, treated with ipratropium nebs, continued on Xopenex nebs, taken off of BiPAP as her condition improves, and ruled-out for PE with d-dimer given recent travel and atraumatic knee pain/stiffness    2. Anxiety  - Patient reports hx of anxiety and required Ativan in ED in order to tolerate BiPAP  3. Tobacco abuse  - Smokes 1 ppd, expresses desire to cut back and quit  - Nicotine patch provided    DVT prophylaxis: Lovenox Code Status: Full  Family Communication: Husband updated at bedside Consults called: None Admission status: Observation     Vianne Bulls, MD [facility] Pager [phone]  If 7PM-7AM, please contact night-coverage [email or web address] Password [facility]  04/09/2018, 3:44 AM

## 2018-04-09 NOTE — ED Provider Notes (Signed)
Patient seen in conjunction with Jerilee Hoh, PA-C.  Patient with COPD exacerbation.  She was seen earlier this evening and had several Xopenex belies her treatments with some improvement of her symptoms.  She was also given 125 mg of Solu-Medrol at that time.  Patient had some persistent wheezing and remained short of breath with mild increased work of breathing, but insisted that she wanted to go home.  She was urged to stay, but ultimately did leave.  She returns a couple of hours later with worsening symptoms.  She is speaking in 1-2 word sentences.  In moderate respiratory distress.  Will start patient on BiPAP.  Discussed giving hour-long Xopenex treatment with Raynelle Dick.D., who recommends giving 5 mg Xopenex and administering over 30 minutes.  I discussed this with respiratory therapy, all are in agreement.  Patient discussed with Dr. Dina Rich, who agrees with plan to admit the patient.  Patient discussed with Dr. Myna Hidalgo, who will admit the patient.  Labs and chest x-ray from previous encounter tonight are in epic.  She has a leukocytosis to 13, chest x-ray is clear, no evidence of infection.  She remains wheezy throughout all lobes.  She states that she has required BiPAP and admission in the past for this.  She is agreeable with the plan for admission now.   Montine Circle, PA-C 04/09/18 9937    Merryl Hacker, MD 04/09/18 [phone]

## 2018-04-09 NOTE — ED Notes (Signed)
Report given to rn on 2w 

## 2018-04-09 NOTE — Discharge Instructions (Addendum)
You have been seen today for shortness of breath. Please read and follow all provided instructions.   1. Medications: nebulizer and inhaler for shortness of breath, usual home medications, continue steroid and antibiotic as prescribed by primary care provider 2. Treatment: rest, drink plenty of fluids 3. Follow Up: Please follow up with your primary doctor in 2 days for discussion of your diagnoses and further evaluation after today's visit; if you do not have a primary care doctor use the resource guide provided to find one; Please return to the ER for any new or worsening symptoms. Please obtain all of your results from medical records or have your doctors office obtain the results - share them with your doctor - you should be seen at your doctors office. Call today to arrange your follow up.   Take medications as prescribed. Please review all of the medicines and only take them if you do not have an allergy to them. Return to the emergency room for worsening condition or new concerning symptoms. Follow up with your regular doctor. If you don't have a regular doctor use one of the numbers below to establish a primary care doctor.  Please be aware that if you are taking birth control pills, taking other prescriptions, ESPECIALLY ANTIBIOTICS may make the birth control ineffective - if this is the case, either do not engage in sexual activity or use alternative methods of birth control such as condoms until you have finished the medicine and your family doctor says it is OK to restart them. If you are on a blood thinner such as COUMADIN, be aware that any other medicine that you take may cause the coumadin to either work too much, or not enough - you should have your coumadin level rechecked in next 7 days if this is the case.  ?  It is also a possibility that you have an allergic reaction to any of the medicines that you have been prescribed - Everybody reacts differently to medications and while MOST  people have no trouble with most medicines, you may have a reaction such as nausea, vomiting, rash, swelling, shortness of breath. If this is the case, please stop taking the medicine immediately and contact your physician.  ?  You should return to the ER if you develop severe or worsening symptoms.   Emergency Department Resource Guide 1) Find a Doctor and Pay Out of Pocket Although you won't have to find out who is covered by your insurance plan, it is a good idea to ask around and get recommendations. You will then need to call the office and see if the doctor you have chosen will accept you as a new patient and what types of options they offer for patients who are self-pay. Some doctors offer discounts or will set up payment plans for their patients who do not have insurance, but you will need to ask so you aren't surprised when you get to your appointment.  2) Contact Your Local Health Department Not all health departments have doctors that can see patients for sick visits, but many do, so it is worth a call to see if yours does. If you don't know where your local health department is, you can check in your phone book. The CDC also has a tool to help you locate your state's health department, and many state websites also have listings of all of their local health departments.  3) Find a [facility] If your illness is not likely to be very  severe or complicated, you may want to try a walk in clinic. These are popping up all over the country in pharmacies, drugstores, and shopping centers. They're usually staffed by nurse practitioners or physician assistants that have been trained to treat common illnesses and complaints. They're usually fairly quick and inexpensive. However, if you have serious medical issues or chronic medical problems, these are probably not your best option.  No Primary Care Doctor: Call Health Connect at  [phone] - they can help you locate a primary care doctor that   accepts your insurance, provides certain services, etc. Physician Referral [phone]  Emergency Department Resource Guide 1) Find a Doctor and Pay Out of Pocket Although you won't have to find out who is covered by your insurance plan, it is a good idea to ask around and get recommendations. You will then need to call the office and see if the doctor you have chosen will accept you as a new patient and what types of options they offer for patients who are self-pay. Some doctors offer discounts or will set up payment plans for their patients who do not have insurance, but you will need to ask so you aren't surprised when you get to your appointment.  2) Contact Your Local Health Department Not all health departments have doctors that can see patients for sick visits, but many do, so it is worth a call to see if yours does. If you don't know where your local health department is, you can check in your phone book. The CDC also has a tool to help you locate your state's health department, and many state websites also have listings of all of their local health departments.  3) Find a [facility] If your illness is not likely to be very severe or complicated, you may want to try a walk in clinic. These are popping up all over the country in pharmacies, drugstores, and shopping centers. They're usually staffed by nurse practitioners or physician assistants that have been trained to treat common illnesses and complaints. They're usually fairly quick and inexpensive. However, if you have serious medical issues or chronic medical problems, these are probably not your best option.  No Primary Care Doctor: Call Health Connect at  [phone] - they can help you locate a primary care doctor that  accepts your insurance, provides certain services, etc. Physician Referral Service- [phone]  Chronic Pain Problems: Organization         Address  Phone   Notes  [facility]   [phone] Patients need to be referred by their primary care doctor.   Medication Assistance: Organization         Address  Phone   Notes  [facility] [address] [place], [place] [address] [phone] --Must be a resident of [facility] -- Must have NO insurance coverage whatsoever (no Medicaid/ Medicare, etc.) -- The pt. MUST have a primary care doctor that directs their care regularly and follows them in the community   [facility]  [phone]   [employer]  [phone]    Agencies that provide inexpensive medical care: Organization         Address  Phone   Notes  [place]  [phone]   [facility]    [phone]   [facility] [place], [place] [address] [phone]   [place]  [place] [phone]   Planned Parenthood    [phone]   [facility]    [phone]   [place] and [place] [address], [place] Phone:  [phone], Fax:  [phone] Hours of Operation:  9 am - 6 pm, M-F.  Also accepts Medicaid/Medicare and self-pay.  [facility] for [place], [address], [place] Phone: [phone], Fax: [phone]. Hours of Operation:  8:30 am - 5:30 pm, M-F.  Also accepts Medicaid and self-pay.  [facility] [place] [address], [place] Phone: [phone]   [place], [place], [place] [phone], Ext. [phone] Mondays & Thursdays: 7-9 AM.  First 15 patients are seen on a first come, first serve basis.    [place] Providers:  Organization         Address  Phone   Notes  [facility] [address],  [phone] Also accepts self-pay patients.  [facility] [address], [place]  [phone]   [place], [address], [place] [phone]   [facility] [address] [place] [phone]   Lucianne Lei [address], [place]   [phone] Only accepts [place] patients after they have their name applied to their card.   Self-Pay (no insurance) in [facility]:  Organization         Address  Phone   Notes  Sickle Cell Patients, [facility] [place] [phone]   [facility] [phone]   [facility] [place]  [place], [place], [place] [phone]   [facility]/Dr. Osei-Bonsu  [address] [place] or [address], [place] [phone] Phone number for both [place] and [place] locations is the same.  Urgent [facility] and [facility] [address], [place] [phone]   [facility] [address] [place] or [address] [phone]   [facility] [address] [place] [phone], phone; [phone], fax Sees patients 1st and 3rd Saturday of every month.  Must not qualify for public or private insurance (i.e. Medicaid, Medicare, [place] Health Choice, Veterans' Benefits)  Household income should be no more than 200% of the poverty level The clinic cannot treat you if you are pregnant or think you are pregnant  Sexually transmitted diseases are not treated at the clinic.

## 2018-04-09 NOTE — ED Notes (Signed)
Pt requesting Cymbalta, stating she did not take last nights dose

## 2018-04-09 NOTE — Progress Notes (Signed)
Pt taken off Bipap, duoneb tx given.  Pt states breathing feels better after tx but RR remains >35 and continues to have increased WOB. Pt placed back on Bipap at this time.  RT will monitor

## 2018-04-09 NOTE — Progress Notes (Signed)
Agree with history physical and plan as per Dr. Myna Hidalgo  Briefly this is a 42 year old Caucasian female known smoker supposed to be on inhalers but does not have nebulizers-migraine-peptic ulcer disease-peripartum cardiomyopathy 2011-seizure disorder-bipolar Recent travel to the [place] returned on 12/3-her husband is also been sick with some respiratory illness and completed what sounds like 10 days of Z-Pak  She came to the emergency room 12/7 was treated and given a Z-Pak and steroids and work-up was done which was overall negative  She returned after not doing well at home on 12/8 a.m. and was admitted with hypoxic respiratory failure increased work of breathing only speaking 1-2 word sentences  Acute hypoxic respiratory failure-get ABG-she is very wheezy on admission-settings are 10/8 we will try to wean her off if she does well I have started duo nebs every 2 hourly she will need stepdown placement and if needed will be reassessed later today  Although it is reasonable to start Tamiflu she has had this for 4 days and the evidence points against its use after 72 hours We we will hold off on testing further for pulmonary embolism as her d-dimer is less than 0.27 which argues against-she is not having chest pain as she subjectively feels somewhat improved from prior  Verneita Griffes, MD [facility] 8:37 AM

## 2018-04-09 NOTE — Progress Notes (Signed)
Patient transported on bipap from ED to room 2U19 without complications.

## 2018-04-09 NOTE — ED Triage Notes (Signed)
Pt returns after leaving AMA, worsening cough and shortness of breath.

## 2018-04-10 ENCOUNTER — Observation Stay (HOSPITAL_COMMUNITY): Payer: Self-pay

## 2018-04-10 ENCOUNTER — Inpatient Hospital Stay (HOSPITAL_COMMUNITY): Payer: Self-pay

## 2018-04-10 DIAGNOSIS — J441 Chronic obstructive pulmonary disease with (acute) exacerbation: Secondary | ICD-10-CM

## 2018-04-10 DIAGNOSIS — I2699 Other pulmonary embolism without acute cor pulmonale: Secondary | ICD-10-CM

## 2018-04-10 DIAGNOSIS — J45901 Unspecified asthma with (acute) exacerbation: Secondary | ICD-10-CM

## 2018-04-10 DIAGNOSIS — J9601 Acute respiratory failure with hypoxia: Principal | ICD-10-CM

## 2018-04-10 LAB — BASIC METABOLIC PANEL
Anion gap: 11 (ref 5–15)
BUN: 14 mg/dL (ref 6–20)
CHLORIDE: 104 mmol/L (ref 98–111)
CO2: 23 mmol/L (ref 22–32)
Calcium: 8.9 mg/dL (ref 8.9–10.3)
Creatinine, Ser: 0.63 mg/dL (ref 0.44–1.00)
GFR calc Af Amer: >60 mL/min (ref >60)
GFR calc non Af Amer: >60 mL/min (ref >60)
Glucose, Bld: 152 mg/dL — ABNORMAL HIGH (ref 70–99)
Potassium: 4.4 mmol/L (ref 3.5–5.1)
SODIUM: 138 mmol/L (ref 135–145)

## 2018-04-10 LAB — CBC WITH DIFFERENTIAL/PLATELET
Abs Immature Granulocytes: 0.09 10*3/uL — ABNORMAL HIGH (ref 0.00–0.07)
Basophils Absolute: 0 10*3/uL (ref 0.0–0.1)
Basophils Relative: 0 %
Eosinophils Absolute: 0 10*3/uL (ref 0.0–0.5)
Eosinophils Relative: 0 %
HCT: 39.2 % (ref 36.0–46.0)
HEMOGLOBIN: 12.8 g/dL (ref 12.0–15.0)
Immature Granulocytes: 1 %
Lymphocytes Relative: 8 %
Lymphs Abs: 1.2 10*3/uL (ref 0.7–4.0)
MCH: 32.3 pg (ref 26.0–34.0)
MCHC: 32.7 g/dL (ref 30.0–36.0)
MCV: 99 fL (ref 80.0–100.0)
MONOS PCT: 2 %
Monocytes Absolute: 0.4 10*3/uL (ref 0.1–1.0)
Neutro Abs: 14 10*3/uL — ABNORMAL HIGH (ref 1.7–7.7)
Neutrophils Relative %: 89 %
Platelets: 308 10*3/uL (ref 150–400)
RBC: 3.96 MIL/uL (ref 3.87–5.11)
RDW: 13 % (ref 11.5–15.5)
WBC: 15.7 10*3/uL — ABNORMAL HIGH (ref 4.0–10.5)
nRBC: 0 % (ref 0.0–0.2)

## 2018-04-10 LAB — RESPIRATORY PANEL BY PCR
Adenovirus: NOT DETECTED
Bordetella pertussis: NOT DETECTED
Chlamydophila pneumoniae: NOT DETECTED
Coronavirus 229E: NOT DETECTED
Coronavirus HKU1: NOT DETECTED
Coronavirus NL63: NOT DETECTED
Coronavirus OC43: NOT DETECTED
Influenza A: NOT DETECTED
Influenza B: NOT DETECTED
Metapneumovirus: NOT DETECTED
Mycoplasma pneumoniae: NOT DETECTED
PARAINFLUENZA VIRUS 3-RVPPCR: NOT DETECTED
Parainfluenza Virus 1: NOT DETECTED
Parainfluenza Virus 2: NOT DETECTED
Parainfluenza Virus 4: NOT DETECTED
Respiratory Syncytial Virus: NOT DETECTED
Rhinovirus / Enterovirus: DETECTED — AB

## 2018-04-10 LAB — PROCALCITONIN

## 2018-04-10 LAB — BRAIN NATRIURETIC PEPTIDE: B Natriuretic Peptide: 92.1 pg/mL (ref 0.0–100.0)

## 2018-04-10 MED ORDER — IOPAMIDOL (ISOVUE-370) INJECTION 76%
100.0000 mL | Freq: Once | INTRAVENOUS | Status: AC | PRN
  Administered 2018-04-10: 100 mL via INTRAVENOUS

## 2018-04-10 MED ORDER — UMECLIDINIUM-VILANTEROL 62.5-25 MCG/INH IN AEPB
1.0000 | INHALATION_SPRAY | Freq: Every day | RESPIRATORY_TRACT | Status: DC
  Administered 2018-04-11: 1 via RESPIRATORY_TRACT
  Filled 2018-04-10: qty 14

## 2018-04-10 MED ORDER — LORAZEPAM 2 MG/ML IJ SOLN
1.0000 mg | Freq: Four times a day (QID) | INTRAMUSCULAR | Status: DC | PRN
  Administered 2018-04-10 – 2018-04-11 (×4): 1 mg via INTRAVENOUS
  Filled 2018-04-10 (×4): qty 1

## 2018-04-10 MED ORDER — LEVOFLOXACIN IN D5W 750 MG/150ML IV SOLN
750.0000 mg | INTRAVENOUS | Status: DC
  Administered 2018-04-10: 750 mg via INTRAVENOUS
  Filled 2018-04-10: qty 150

## 2018-04-10 MED ORDER — IOPAMIDOL (ISOVUE-370) INJECTION 76%
INTRAVENOUS | Status: AC
  Filled 2018-04-10: qty 100

## 2018-04-10 NOTE — Consult Note (Addendum)
NAME:  Lisa Ryan, MRN:  [id number], DOB:  [birth year], LOS: 0 ADMISSION DATE:  04/09/2018, CONSULTATION DATE:  04/10/2018 REFERRING MD:  Verlon Au, CHIEF COMPLAINT:  Acute respiratory failure   Brief History   42 year old female who recently was on a cruise where she was immobile for sometime and started feeling SOB while on the cruise specially with activity, also had swelling in both her legs and pain in the right leg from the knee down.  Patient came to the ED on 12/7 and was given steroids and zithromax and discharged.  While home, SOB became worse and patient was unable to speak in full sentence.  Patient was brought back to the ED and BiPAP was started.  Per the husband, patient is on a BiPAP and unable to speak clearly, per the husband patient felt warm and had significant diaphoresis with cough productive of yellow to green sputum.    History of present illness   42 year old female who recently was on a cruise where she was immobile for sometime and started feeling SOB while on the cruise specially with activity, also had swelling in both her legs and pain in the right leg from the knee down.  Patient came to the ED on 12/7 and was given steroids and zithromax and discharged.  While home, SOB became worse and patient was unable to speak in full sentence.  Patient was brought back to the ED and BiPAP was started.  Per the husband, patient is on a BiPAP and unable to speak clearly, per the husband patient felt warm and had significant diaphoresis with cough productive of yellow to green sputum  Past Medical History  COPD for which she is not receiving any medications (prescribed but not taken) Active smoker  [facility]   12/8 admission on BiPAP  Consults:  PCCM  Procedures:  None  Significant Diagnostic Tests:  CTA 12/9>>>  Micro Data:  Blood 12/9>>> Sputum 12/9>>>  Antimicrobials:  None   Interim history/subjective:  SOB much better with  BiPAP  Objective   Blood pressure 109/75, pulse 97, temperature 98 F (36.7 C), temperature source Axillary, resp. rate (!) 23, height 5\' 7"  (1.702 m), weight 88.2 kg, SpO2 97 %.    FiO2 (%):  [23 %-30 %] 23 %   Intake/Output Summary (Last 24 hours) at 04/10/2018 1421 Last data filed at 04/09/2018 1800 Gross per 24 hour  Intake 0 ml  Output -  Net 0 ml   Filed Weights   04/09/18 1731  Weight: 88.2 kg    Examination: General: Acutely ill appearing female on BiPAP HENT: [place]/AT, PERRL, EOM-I and MMM Lungs: Clear bilaterally with distant BS Cardiovascular: RRR, Nl S1/S2 and -M/R/G Abdomen: Soft, NT, ND and +BS Extremities: -edema and -tenderness Neuro: Alert and interactive, moving all ext to command Skin: Intact  Resolved Hospital Problem list   N/A  I reviewed CXR myself, no acute disease noted  Assessment & Plan:  42 year old female with history of COPD who has recently arrived from a cruise and has had respiratory symptoms since with acute respiratory failure requiring BiPAP.  DDx of PE vs COPD exacerbation vs viral bronchitis resulting in respiratory failure.  Discussed with [facility]-MD.  Acute respiratory failure:  - BiPAP  - Check RVP  - Check BNP  Hypoxemia:  - Titrate O2 for sat of 88-92%  - CTA to R/O PE  - Hold off heparin for now given the fact that D-dimer is  negative  COPD:  - Levaquin for airway sterilization  - Check PCT  - Steroids as ordered  PCCM will continue to follow  Labs   CBC: Recent Labs  Lab 04/08/18 2246 04/10/18 0213  WBC 13.9* 15.7*  NEUTROABS 11.2* 14.0*  HGB 14.0 12.8  HCT 41.6 39.2  MCV 98.3 99.0  PLT 379 177    Basic Metabolic Panel: Recent Labs  Lab 04/08/18 2246 04/09/18 0416 04/10/18 0213  NA 134* 137 138  K 3.6 3.6 4.4  CL 99 101 104  CO2 22 21* 23  GLUCOSE 133* 183* 152*  BUN 7 9 14   CREATININE 0.61 0.86 0.63  CALCIUM 9.3 9.8 8.9   GFR: Estimated Creatinine Clearance: 102.3 mL/min (by C-G formula based on  SCr of 0.63 mg/dL). Recent Labs  Lab 04/08/18 2246 04/10/18 0213  WBC 13.9* 15.7*    Liver Function Tests: No results for input(s): AST, ALT, ALKPHOS, BILITOT, PROT, ALBUMIN in the last 168 hours. No results for input(s): LIPASE, AMYLASE in the last 168 hours. No results for input(s): AMMONIA in the last 168 hours.  ABG    Component Value Date/Time   PHART 7.394 04/09/2018 0932   PCO2ART 32.7 04/09/2018 0932   PO2ART 98.0 04/09/2018 0932   HCO3 20.0 04/09/2018 0932   TCO2 21 (L) 04/09/2018 0932   ACIDBASEDEF 4.0 (H) 04/09/2018 0932   O2SAT 98.0 04/09/2018 0932     Coagulation Profile: No results for input(s): INR, PROTIME in the last 168 hours.  Cardiac Enzymes: No results for input(s): CKTOTAL, CKMB, CKMBINDEX, TROPONINI in the last 168 hours.  HbA1C: No results found for: HGBA1C  CBG: No results for input(s): GLUCAP in the last 168 hours.  Review of Systems:     Past Medical History  She,  has a past medical history of Anemia, Anxiety state, unspecified (10/12/2007), CHEST PAIN, ATYPICAL (02/18/2010), CHEST PAIN-PRECORDIAL (02/19/2010), CHF (congestive heart failure) ([place]), COPD (chronic obstructive pulmonary disease) ([place]), FATIGUE (09/28/2007), GASTROENTERITIS (10/03/2008), GERD (gastroesophageal reflux disease), MIGRAINE HEADACHE (03/04/2010), PEDAL EDEMA (02/18/2010), PEPTIC ULCER DISEASE (09/28/2007), PERIPARTUM CARDIOMYOPATHY POSTPARTUM COND/COMP (02/18/2010), PONV (postoperative nausea and vomiting), SEIZURE DISORDER (09/28/2007), Stroke [facility]), Thyroid nodule, Tobacco abuse, and WEIGHT GAIN (09/28/2007).   Surgical History    Past Surgical History:  Procedure Laterality Date  . ANKLE SURGERY     left  . APPENDECTOMY    . CESAREAN SECTION     x3  . CHOLECYSTECTOMY    . DILITATION & CURRETTAGE/HYSTROSCOPY WITH NOVASURE ABLATION N/A 12/26/2015   Procedure: DILATATION & CURETTAGE/HYSTEROSCOPY WITH ENDOMETRIAL ABLATION;  Surgeon: Jonnie Kind, MD;  Location: [facility];  Service: Gynecology;  Laterality: N/A;  . KNEE ARTHROSCOPY     left  . MOUTH LESION EXCISIONAL BIOPSY  04/2011   pre-cancerous, pallet of mouth     Social History   reports that she has been smoking cigarettes. She has a 20.00 pack-year smoking history. She has never used smokeless tobacco. She reports that she does not drink alcohol or use drugs.   Family History   Her family history includes Cerebral aneurysm in her mother; Colon cancer in her maternal aunt, maternal grandmother, and mother; Diabetes in her sister; Diabetes (age of onset: 41) in her brother; Heart disease in her father; Kidney disease in her maternal grandmother; Liver disease in her father.   Allergies Allergies  Allergen Reactions  . Albuterol Anxiety and Other (See Comments)    Increased HR 130s  . Morphine Other (See Comments)  hallucinations     Home Medications  Prior to Admission medications   Medication Sig Start Date End Date Taking? Authorizing Provider  cholecalciferol (VITAMIN D3) 25 MCG (1000 UT) tablet Take 5,000 Units by mouth at bedtime.    [provider]  ciprofloxacin (CIPRO) 500 MG tablet Take 1 tablet (500 mg total) 2 (two) times daily by mouth. One po bid x 7 days Patient not taking: Reported on 04/08/2018 03/10/17   Ripley Fraise, MD  DULoxetine (CYMBALTA) 60 MG capsule Take 60 mg by mouth at bedtime.    [provider]  DULoxetine HCl (CYMBALTA PO) Take by mouth.    [provider]  fluticasone-salmeterol (ADVAIR HFA) 115-21 MCG/ACT inhaler Inhale 2 puffs into the lungs 2 (two) times daily. Patient taking differently: Inhale 2 puffs into the lungs daily as needed (SOB).  10/30/13   Juanito Doom, MD  levalbuterol (XOPENEX) 0.63 MG/3ML nebulizer solution Take 3 mLs (0.63 mg total) by nebulization every 8 (eight) hours as needed for wheezing or shortness of breath. 04/09/18   Darlin Drop P, PA-C  metroNIDAZOLE (FLAGYL) 500 MG tablet Take 1 tablet (500  mg total) 2 (two) times daily by mouth. One po bid x 7 days Patient not taking: Reported on 04/08/2018 03/10/17   Ripley Fraise, MD  naproxen (NAPROSYN) 500 MG tablet TAKE 1 TABLET BY MOUTH TWICE DAILY WITH A MEAL Patient taking differently: Take 500 mg by mouth 2 (two) times daily as needed for mild pain.  06/13/16   Carole Civil, MD  Spacer/Aero-Holding Chambers (AEROCHAMBER MV) inhaler Use as instructed 10/30/13   Juanito Doom, MD    Rush Farmer, M.D. [facility] Pulmonary/Critical Care Medicine. Pager: [phone]. After hours pager: [phone].

## 2018-04-10 NOTE — Progress Notes (Signed)
Pharmacy Antibiotic Note  Maxene Ethelle Lyon is a 42 y.o. female admitted on 04/09/2018 with community acquired pneumonia.  Pharmacy has been consulted for levofloxacin dosing.  Plan: Levofloxacin 750mg  IV q24h F/u clinical progress, de-escalation and LOT  Height: 5\' 7"  (170.2 cm) Weight: 194 lb 8 oz (88.2 kg) IBW/kg (Calculated) : 61.6  Temp (24hrs), Avg:97.8 F (36.6 C), Min:97.7 F (36.5 C), Max:98 F (36.7 C)  Recent Labs  Lab 04/08/18 2246 04/09/18 0416 04/10/18 0213  WBC 13.9*  --  15.7*  CREATININE 0.61 0.86 0.63    Estimated Creatinine Clearance: 102.3 mL/min (by C-G formula based on SCr of 0.63 mg/dL).    Allergies  Allergen Reactions  . Albuterol Anxiety and Other (See Comments)    Increased HR 130s  . Morphine Other (See Comments)    hallucinations    Davie Claud A. Levada Dy, PharmD, [place] Pager: [phone] Please utilize Amion for appropriate phone number to reach the unit pharmacist ([place])   04/10/2018 2:55 PM

## 2018-04-10 NOTE — Progress Notes (Signed)
[facility] PROGRESS NOTE  ANDJELA WICKES ZOX:[id number] DOB: 1975/05/26 DOA: 04/09/2018 PCP: Doree Albee, MD   Narrative: Briefly this is a 42 year old Caucasian female known smoker supposed to be on inhalers but does not have nebulizers-migraine-peptic ulcer disease-peripartum cardiomyopathy 2011-seizure disorder-bipolar Recent travel to the [place] returned on 12/3-her husband is also been sick with some respiratory illness and completed what sounds like 10 days of Z-Pak  She came to the emergency room 12/7 was treated and given a Z-Pak and steroids and work-up was done which was overall negative  She returned after not doing well at home on 12/8 a.m. and was admitted with hypoxic respiratory failure increased work of breathing only speaking 1-2 word sentences  CT chest neg for PE  A & Plan Acute exacerbation COPD-rpt CXR-add Anoro Ellipta-She is less wheezy-replaced albuterol with Xopenex-cont Solu-medrol q6 as is improved-Asked RT to page me later today if unable to liberate from vent--Have asked CCM to see her as well Increased Ativan from 0.5 to1 mg as likely anxiety component to her illness as well Bipolar-as abvoe-added back Cymbalta qhs at night Migraine stable Peri-partum CM in the past-not on meds-no crackles or rales and do not suspect playing a role -las techo 10/2013 was normalized Body mass index is 30.46 kg/m.-stable  Lovenox d/w husband   Verlon Au, MD  [facility] Direct contact: [phone] --[place] [place]  --[email or web address]; password [email or web address]  7PM-7AM contact night coverage as above 04/10/2018, 8:14 AM  LOS: 0 days   Consultants:  Pulmonary  Procedures:   cxr  Antimicrobials:   none  Interval history/Subjective: n  Objective:  Vitals:  Vitals:   04/10/18 0740 04/10/18 0746  BP:    Pulse:  (!) 109  Resp:  (!) 43  Temp:    SpO2: 98% 97%    Exam:  Anxious cta b [on bipapa-exam not optimal]--no wheeze however s1 s 2no  m/r/g abd soft nt  No le edema Moves 4 limbs equally  No LE swelling No rash  I have personally reviewed the following:   Labs:  WBC 13-.15  Imaging studies:  CXr form this am pending  Medical tests:  n   Test discussed with performing physician:  n  Decision to obtain old records:  n  Review and summation of old records:  nn  Scheduled Meds: . DULoxetine  60 mg Oral QHS  . enoxaparin (LOVENOX) injection  40 mg Subcutaneous Daily  . levalbuterol  1.25 mg Nebulization Q4H  . methylPREDNISolone (SOLU-MEDROL) injection  60 mg Intravenous Q6H  . mometasone-formoterol  2 puff Inhalation BID  . nicotine  21 mg Transdermal Daily  . sodium chloride flush  3 mL Intravenous Q12H  . sodium chloride flush  3 mL Intravenous Q12H   Continuous Infusions: . sodium chloride      Principal Problem:   Asthma with acute exacerbation Active Problems:   Anxiety   LOS: 0 days

## 2018-04-11 DIAGNOSIS — F419 Anxiety disorder, unspecified: Secondary | ICD-10-CM

## 2018-04-11 DIAGNOSIS — J9601 Acute respiratory failure with hypoxia: Secondary | ICD-10-CM

## 2018-04-11 DIAGNOSIS — R0602 Shortness of breath: Secondary | ICD-10-CM

## 2018-04-11 LAB — GLUCOSE, CAPILLARY: Glucose-Capillary: 164 mg/dL — ABNORMAL HIGH (ref 70–99)

## 2018-04-11 LAB — MRSA PCR SCREENING: MRSA by PCR: NEGATIVE

## 2018-04-11 MED ORDER — PREDNISONE 20 MG PO TABS: 60.0000 mg | ORAL_TABLET | Freq: Every day | ORAL | Status: DC

## 2018-04-11 MED ORDER — DEXTROMETHORPHAN POLISTIREX ER 30 MG/5ML PO SUER
15.0000 mg | Freq: Two times a day (BID) | ORAL | Status: DC
  Administered 2018-04-11 – 2018-04-12 (×4): 15 mg via ORAL
  Filled 2018-04-11 (×5): qty 5

## 2018-04-11 MED ORDER — ALPRAZOLAM 0.5 MG PO TABS
1.0000 mg | ORAL_TABLET | Freq: Two times a day (BID) | ORAL | Status: DC
  Administered 2018-04-11 – 2018-04-12 (×3): 1 mg via ORAL
  Filled 2018-04-11 (×3): qty 2

## 2018-04-11 MED ORDER — PREDNISONE 20 MG PO TABS
40.0000 mg | ORAL_TABLET | Freq: Every day | ORAL | Status: AC
  Administered 2018-04-11 – 2018-04-15 (×5): 40 mg via ORAL
  Filled 2018-04-11 (×5): qty 2

## 2018-04-11 MED ORDER — HYDROXYZINE HCL 25 MG PO TABS
25.0000 mg | ORAL_TABLET | Freq: Three times a day (TID) | ORAL | Status: DC
  Administered 2018-04-11 – 2018-04-16 (×15): 25 mg via ORAL
  Filled 2018-04-11 (×16): qty 1

## 2018-04-11 MED ORDER — HYDROCODONE-HOMATROPINE 5-1.5 MG/5ML PO SYRP
5.0000 mL | ORAL_SOLUTION | ORAL | Status: DC | PRN
  Administered 2018-04-11 – 2018-04-17 (×8): 5 mL via ORAL
  Filled 2018-04-11 (×10): qty 5

## 2018-04-11 MED ORDER — GUAIFENESIN-DM 100-10 MG/5ML PO SYRP
5.0000 mL | ORAL_SOLUTION | ORAL | Status: DC | PRN
  Administered 2018-04-11: 5 mL via ORAL
  Filled 2018-04-11: qty 5

## 2018-04-11 MED ORDER — ALPRAZOLAM ER 1 MG PO TB24: 2.0000 mg | ORAL_TABLET | Freq: Every day | ORAL | Status: DC

## 2018-04-11 NOTE — Progress Notes (Signed)
NAME:  Lisa Ryan, MRN:  [id number], DOB:  February 05, 1976, LOS: 1 ADMISSION DATE:  04/09/2018, CONSULTATION DATE:  04/10/2018 REFERRING MD:  Verlon Au, CHIEF COMPLAINT:  Acute respiratory failure   Brief History   42 year old female who recently was on a cruise where she was immobile for sometime and started feeling SOB while on the cruise specially with activity, also had swelling in both her legs and pain in the right leg from the knee down.  Patient came to the ED on 12/7 and was given steroids and zithromax and discharged.  While home, SOB became worse and patient was unable to speak in full sentence.  Patient was brought back to the ED and BiPAP was started.  Per the husband, patient is on a BiPAP and unable to speak clearly, per the husband patient felt warm and had significant diaphoresis with cough productive of yellow to green sputum.    History of present illness   42 year old female who recently was on a cruise where she was immobile for sometime and started feeling SOB while on the cruise specially with activity, also had swelling in both her legs and pain in the right leg from the knee down.  Patient came to the ED on 12/7 and was given steroids and zithromax and discharged.  While home, SOB became worse and patient was unable to speak in full sentence.  Patient was brought back to the ED and BiPAP was started.  Per the husband, patient is on a BiPAP and unable to speak clearly, per the husband patient felt warm and had significant diaphoresis with cough productive of yellow to green sputum  Past Medical History  COPD for which she is not receiving any medications (prescribed but not taken) Active smoker  [place] [facility]   12/8 admission on BiPAP  Consults:  PCCM  Procedures:  None  Significant Diagnostic Tests:  CTA 12/9>>>  Micro Data:  Blood 12/9>>> Sputum 12/9>>>  Antimicrobials:  None   Interim history/subjective:  Removed BiPAP and immediately went  into respiratory distress and asked for BiPAP back even though sats remained completely stable  Objective   Blood pressure 122/68, pulse (!) 104, temperature 98.1 F (36.7 C), temperature source Axillary, resp. rate (!) 38, height 5\' 7"  (1.702 m), weight 88.2 kg, SpO2 96 %.    FiO2 (%):  [23 %] 23 %   Intake/Output Summary (Last 24 hours) at 04/11/2018 1128 Last data filed at 04/11/2018 0500 Gross per 24 hour  Intake 114.52 ml  Output -  Net 114.52 ml   Filed Weights   04/09/18 1731  Weight: 88.2 kg   Examination: General: Acutely ill appearing female needing BiPAP HENT: [place]/AT, PERRL, EOM-I and MMM Lungs: Transmitted upper airway sounds on expiration Cardiovascular: Sinus tach, Nl S1/S2 and -M/R/G Abdomen: Soft, NT, ND and +BS Extremities: -edema and -tenderness Neuro: Alert and interactive, moving all ext to command, very anxious appearing Skin: Intact  Resolved Hospital Problem list   N/A  I reviewed chest CT myself, no PE and minimal parenchymal disease  Assessment & Plan:  41 year old female with history of COPD who has recently arrived from a cruise and has had respiratory symptoms since with acute respiratory failure requiring BiPAP.  DDx of PE vs COPD exacerbation vs viral bronchitis resulting in respiratory failure.  Discussed with [facility]-MD and PCCM-NP.  I do believe anxiety is a major component here.    Acute respiratory failure:  - BiPAP as needed  -  RVP positive for rhinoviruses  - BNP noted  - Anxiety is a major component, decrease solumedrol and change to prednisone  - Will need anxiolytics, on xanax, increase  Hypoxemia:  - Titrate O2 for sat of 88-92%  - CTA to R/O PE negative  COPD:  - D/C levaquin  - Check PCT  - Steroids as ordered, change to PO prednisone  PCCM will continue to follow  Labs   CBC: Recent Labs  Lab 04/08/18 2246 04/10/18 0213  WBC 13.9* 15.7*  NEUTROABS 11.2* 14.0*  HGB 14.0 12.8  HCT 41.6 39.2  MCV 98.3 99.0  PLT 379  433    Basic Metabolic Panel: Recent Labs  Lab 04/08/18 2246 04/09/18 0416 04/10/18 0213  NA 134* 137 138  K 3.6 3.6 4.4  CL 99 101 104  CO2 22 21* 23  GLUCOSE 133* 183* 152*  BUN 7 9 14   CREATININE 0.61 0.86 0.63  CALCIUM 9.3 9.8 8.9   GFR: Estimated Creatinine Clearance: 102.3 mL/min (by C-G formula based on SCr of 0.63 mg/dL). Recent Labs  Lab 04/08/18 2246 04/10/18 0213  PROCALCITON  --  <0.10  WBC 13.9* 15.7*    Liver Function Tests: No results for input(s): AST, ALT, ALKPHOS, BILITOT, PROT, ALBUMIN in the last 168 hours. No results for input(s): LIPASE, AMYLASE in the last 168 hours. No results for input(s): AMMONIA in the last 168 hours.  ABG    Component Value Date/Time   PHART 7.394 04/09/2018 0932   PCO2ART 32.7 04/09/2018 0932   PO2ART 98.0 04/09/2018 0932   HCO3 20.0 04/09/2018 0932   TCO2 21 (L) 04/09/2018 0932   ACIDBASEDEF 4.0 (H) 04/09/2018 0932   O2SAT 98.0 04/09/2018 0932     Coagulation Profile: No results for input(s): INR, PROTIME in the last 168 hours.  Cardiac Enzymes: No results for input(s): CKTOTAL, CKMB, CKMBINDEX, TROPONINI in the last 168 hours.  HbA1C: No results found for: HGBA1C  CBG: No results for input(s): GLUCAP in the last 168 hours.  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   Surgical History     Past Surgical History:  Procedure Laterality Date  . ANKLE SURGERY     left  . APPENDECTOMY    . CESAREAN SECTION     x3  . CHOLECYSTECTOMY    . DILITATION & CURRETTAGE/HYSTROSCOPY WITH NOVASURE ABLATION N/A 12/26/2015   Procedure: DILATATION & CURETTAGE/HYSTEROSCOPY WITH ENDOMETRIAL ABLATION;  Surgeon: Jonnie Kind, MD;  Location: [facility];  Service: Gynecology;  Laterality: N/A;  . KNEE ARTHROSCOPY     left  . MOUTH LESION EXCISIONAL BIOPSY  04/2011   pre-cancerous, pallet of mouth     Social History   reports that she has been smoking cigarettes. She has a 20.00 pack-year smoking history. She has never used smokeless tobacco. She reports that she does not drink alcohol or use drugs.   Family History   Her family history includes Cerebral aneurysm in her mother; Colon cancer in her maternal aunt, maternal grandmother, and mother; Diabetes in her sister; Diabetes (  age of onset: 1) in her brother; Heart disease in her father; Kidney disease in her maternal grandmother; Liver disease in her father.   Allergies Allergies  Allergen Reactions  . Albuterol Anxiety and Other (See Comments)    Increased HR 130s  . Morphine Other (See Comments)    hallucinations     Home Medications  Prior to Admission medications   Medication Sig Start Date End Date Taking? Authorizing Provider  cholecalciferol (VITAMIN D3) 25 MCG (1000 UT) tablet Take 5,000 Units by mouth at bedtime.    [provider]  ciprofloxacin (CIPRO) 500 MG tablet Take 1 tablet (500 mg total) 2 (two) times daily by mouth. One po bid x 7 days Patient not taking: Reported on 04/08/2018 03/10/17   Ripley Fraise, MD  DULoxetine (CYMBALTA) 60 MG capsule Take 60 mg by mouth at bedtime.    [provider]  DULoxetine HCl (CYMBALTA PO) Take by mouth.    [provider]  fluticasone-salmeterol (ADVAIR HFA) 115-21 MCG/ACT inhaler Inhale 2 puffs into the lungs 2 (two) times daily. Patient taking  differently: Inhale 2 puffs into the lungs daily as needed (SOB).  10/30/13   Juanito Doom, MD  levalbuterol (XOPENEX) 0.63 MG/3ML nebulizer solution Take 3 mLs (0.63 mg total) by nebulization every 8 (eight) hours as needed for wheezing or shortness of breath. 04/09/18   Darlin Drop P, PA-C  metroNIDAZOLE (FLAGYL) 500 MG tablet Take 1 tablet (500 mg total) 2 (two) times daily by mouth. One po bid x 7 days Patient not taking: Reported on 04/08/2018 03/10/17   Ripley Fraise, MD  naproxen (NAPROSYN) 500 MG tablet TAKE 1 TABLET BY MOUTH TWICE DAILY WITH A MEAL Patient taking differently: Take 500 mg by mouth 2 (two) times daily as needed for mild pain.  06/13/16   Carole Civil, MD  Spacer/Aero-Holding Chambers (AEROCHAMBER MV) inhaler Use as instructed 10/30/13   Juanito Doom, MD    Rush Farmer, M.D. [facility] Pulmonary/Critical Care Medicine. Pager: [phone]. After hours pager: [phone].

## 2018-04-11 NOTE — Progress Notes (Signed)
[facility] PROGRESS NOTE  Lisa Ryan HAL:[id number] DOB: 12-Jul-1975 DOA: 04/09/2018 PCP: Doree Albee, MD   Narrative:  42 year old Caucasian female known smoker supposed to be on inhalers but does not have nebulizers-migraine-peptic ulcer disease-peripartum cardiomyopathy 2011-seizure disorder-bipolar Recent travel to the [place] returned on 12/3-her husband is also been sick with some respiratory illness and completed what sounds like 10 days of Z-Pak  She came to the emergency room 12/7 was treated and given a Z-Pak and steroids and work-up was done which was overall negative  She returned after not doing well at home on 12/8 a.m. and was admitted with hypoxic respiratory failure increased work of breathing only speaking 1-2 word sentences  CT chest neg for PE  A & Plan Acute exacerbation COPD 2/2 + Rhinovirus-rpt CXR-appreciate CCM seeing-stop solu-medrol [makes anxious]cont dulera and albuterol Work of breathin better-sats good off CAP but multiple forced coughs noticed today and RR goes up into 40 necessitating bipap  Anxiety-changed off of Ativan to Xanax 2 mg for more sustained Rx of her anxiety-starting hydroxyzine 25 tid as well  Bipolar-as abvoe-added back Cymbalta qhs at night  Migraine stable Peri-partum CM in the past-not on meds-no crackles or rales and do not suspect playing a role -las techo 10/2013 was normalized  Body mass index is 30.46 kg/m.-stable  Lovenox d/w husband   Verlon Au, MD  [facility] Direct contact: [phone] --[place] [place]  --[email or web address]; password [email or web address]  7PM-7AM contact night coverage as above 04/11/2018, 11:25 AM  LOS: 1 day   Consultants:  Pulmonary  Procedures:   cxr  Antimicrobials:   none  Interval history/Subjective: Fair Anxious +++ off bipap No cp No fever  Objective:  Vitals:  Vitals:   04/11/18 1000 04/11/18 1102  BP:    Pulse: (!) 101   Resp: (!) 23   Temp:    SpO2: 94% 93%     Exam:  Anxious cta b no wheeze off bipap talking fulls entences s1 s 2no m/r/g abd soft nt  No le edema Moves 4 limbs equally  No LE swelling No rash  I have personally reviewed the following:   Labs:  WBC 13-.15.7  bnp 92, PCT neg  Rhinovirus +  Imaging studies:  CXr form this am pending  Medical tests:  n   Test discussed with performing physician:  n  Decision to obtain old records:  n  Review and summation of old records:  nn  Scheduled Meds: . dextromethorphan  15 mg Oral BID  . DULoxetine  60 mg Oral QHS  . enoxaparin (LOVENOX) injection  40 mg Subcutaneous Daily  . levalbuterol  1.25 mg Nebulization Q4H  . mometasone-formoterol  2 puff Inhalation BID  . nicotine  21 mg Transdermal Daily  . predniSONE  60 mg Oral QAC breakfast  . sodium chloride flush  3 mL Intravenous Q12H  . sodium chloride flush  3 mL Intravenous Q12H   Continuous Infusions: . sodium chloride    . levofloxacin (LEVAQUIN) IV 750 mg (04/10/18 1736)    Principal Problem:   Asthma with acute exacerbation Active Problems:   Anxiety   LOS: 1 day

## 2018-04-12 DIAGNOSIS — R0902 Hypoxemia: Secondary | ICD-10-CM

## 2018-04-12 LAB — BASIC METABOLIC PANEL
Anion gap: 7 (ref 5–15)
BUN: 21 mg/dL — ABNORMAL HIGH (ref 6–20)
CO2: 27 mmol/L (ref 22–32)
CREATININE: 0.7 mg/dL (ref 0.44–1.00)
Calcium: 8.7 mg/dL — ABNORMAL LOW (ref 8.9–10.3)
Chloride: 103 mmol/L (ref 98–111)
GFR calc Af Amer: >60 mL/min (ref >60)
GFR calc non Af Amer: >60 mL/min (ref >60)
Glucose, Bld: 128 mg/dL — ABNORMAL HIGH (ref 70–99)
Potassium: 4 mmol/L (ref 3.5–5.1)
Sodium: 137 mmol/L (ref 135–145)

## 2018-04-12 LAB — CBC WITH DIFFERENTIAL/PLATELET
Abs Immature Granulocytes: 0.15 10*3/uL — ABNORMAL HIGH (ref 0.00–0.07)
Basophils Absolute: 0 10*3/uL (ref 0.0–0.1)
Basophils Relative: 0 %
EOS ABS: 0 10*3/uL (ref 0.0–0.5)
Eosinophils Relative: 0 %
HEMATOCRIT: 37.8 % (ref 36.0–46.0)
Hemoglobin: 12.4 g/dL (ref 12.0–15.0)
Immature Granulocytes: 1 %
Lymphocytes Relative: 16 %
Lymphs Abs: 1.9 10*3/uL (ref 0.7–4.0)
MCH: 32.6 pg (ref 26.0–34.0)
MCHC: 32.8 g/dL (ref 30.0–36.0)
MCV: 99.5 fL (ref 80.0–100.0)
Monocytes Absolute: 0.9 10*3/uL (ref 0.1–1.0)
Monocytes Relative: 7 %
Neutro Abs: 9.1 10*3/uL — ABNORMAL HIGH (ref 1.7–7.7)
Neutrophils Relative %: 76 %
Platelets: 305 10*3/uL (ref 150–400)
RBC: 3.8 MIL/uL — ABNORMAL LOW (ref 3.87–5.11)
RDW: 12.8 % (ref 11.5–15.5)
WBC: 12.1 10*3/uL — ABNORMAL HIGH (ref 4.0–10.5)
nRBC: 0 % (ref 0.0–0.2)

## 2018-04-12 MED ORDER — IPRATROPIUM-ALBUTEROL 0.5-2.5 (3) MG/3ML IN SOLN
RESPIRATORY_TRACT | Status: AC
  Administered 2018-04-12: 3 mL via RESPIRATORY_TRACT
  Filled 2018-04-12: qty 3

## 2018-04-12 MED ORDER — IPRATROPIUM-ALBUTEROL 0.5-2.5 (3) MG/3ML IN SOLN
3.0000 mL | RESPIRATORY_TRACT | Status: DC | PRN
  Administered 2018-04-12 – 2018-04-17 (×4): 3 mL via RESPIRATORY_TRACT
  Filled 2018-04-12 (×3): qty 3

## 2018-04-12 MED ORDER — IPRATROPIUM-ALBUTEROL 0.5-2.5 (3) MG/3ML IN SOLN
3.0000 mL | Freq: Four times a day (QID) | RESPIRATORY_TRACT | Status: DC
  Administered 2018-04-12 – 2018-04-16 (×17): 3 mL via RESPIRATORY_TRACT
  Filled 2018-04-12 (×16): qty 3

## 2018-04-12 MED ORDER — ALPRAZOLAM 0.5 MG PO TABS
1.0000 mg | ORAL_TABLET | Freq: Three times a day (TID) | ORAL | Status: DC | PRN
  Administered 2018-04-12 – 2018-04-14 (×5): 1 mg via ORAL
  Filled 2018-04-12 (×5): qty 2

## 2018-04-12 NOTE — Progress Notes (Signed)
PROGRESS NOTE    Lisa Ryan  TDV:[id number] DOB: 05/22/1975 DOA: 04/09/2018 PCP: Lisa Albee, MD    Brief Narrative:  42 year old female who presented with dyspnea, productive cough, wheezing, nasal congestion subjective fevers.  She does have the significant past medical history for asthma, anxiety and heart failure.  Her symptoms started 4 days prior to hospitalization with malaise and nasal congestion which progressed into dyspnea productive cough and wheezing.  She initially was seen in the emergency department, was treated with dilators/ steroids and discharged home, she returned the next day due to persistent symptoms.  On her initial physical examination she was tachypneic, tachycardic, respiratory rate 30, heart rate 110, her temperature was 97.5, oxygen saturation 96%, she was showing signs of respiratory distress, prolonged expiratory phase with diffuse wheezing, heart S1-S2 present, tachycardic, abdomen soft nontender, no lower extremity edema.  Patient was diagnosed with acute asthma exacerbation.  Assessment & Plan:   Principal Problem:   Asthma with acute exacerbation Active Problems:   Anxiety   Shortness of breath   Acute respiratory failure with hypoxemia (HCC)   1. Asthma exacerbation. This am with deceased wheezing, continue to have decreased air movement, will attempt wean oxygent to venti mask, continue oxymetry monitoring and aggressive bronchodilator therapy. Will change levalbueterol to combination of b2 agonist and anticholinergic with duoneb. Out of bed to chair as tolerated. Continue prednisone 40 mg and dulera.   2. Anxiety. Positive episodes of anxiety, that are affecting her degree of dyspnea, will increase alprazolam to tid as needed. Continue duloxetine.  3. Tobacco abuse. Continue nicotine patch 21 mg, smoking cessation     DVT prophylaxis: enoxaparin   Code Status: full Family Communication:  Disposition Plan/ discharge barriers: Pending  clinical improvement.   Body mass index is 30.46 kg/m. Malnutrition Type:      Malnutrition Characteristics:      Nutrition Interventions:     RN Pressure Injury Documentation:     Consultants:   Pulmonary   Procedures:     Antimicrobials:       Subjective: Patient continue to have dyspnea, has shown episodes of anxiety while trying to discontinue bipap. Positive skin injury to nasal bridge. No nausea or vomiting. Patient has been on a full liquid diet.   Objective: Vitals:   04/11/18 2000 04/12/18 0012 04/12/18 0347 04/12/18 0741  BP: 133/75 108/64    Pulse: 79 74 74 63  Resp:  (!) 28 15 15   Temp:  98.1 F (36.7 C) 98.5 F (36.9 C)   TempSrc:  Axillary Axillary   SpO2: 93% 95% 95% 95%  Weight:      Height:        Intake/Output Summary (Last 24 hours) at 04/12/2018 0807 Last data filed at 04/12/2018 0626 Gross per 24 hour  Intake 225 ml  Output 525 ml  Net -300 ml   Filed Weights   04/09/18 1731  Weight: 88.2 kg    Examination:   General: deconditioned and ill looking appearing Neurology: Awake and alert, non focal  E ENT: mild pallor, no icterus, oral mucosa moist/ dressing over nasal bridge.  Cardiovascular: No JVD. S1-S2 present, rhythmic, no gallops, rubs, or murmurs. No lower extremity edema. Pulmonary: decreased breath sounds bilaterally, poor air movement, no wheezing, rhonchi or rales. Gastrointestinal. Abdomen distended with no organomegaly, non tender, no rebound or guarding Skin. No rashes Musculoskeletal: no joint deformities     Data Reviewed: I have personally reviewed following labs and imaging studies  CBC:  Recent Labs  Lab 04/08/18 2246 04/10/18 0213 04/12/18 0325  WBC 13.9* 15.7* 12.1*  NEUTROABS 11.2* 14.0* 9.1*  HGB 14.0 12.8 12.4  HCT 41.6 39.2 37.8  MCV 98.3 99.0 99.5  PLT 379 308 768   Basic Metabolic Panel: Recent Labs  Lab 04/08/18 2246 04/09/18 0416 04/10/18 0213 04/12/18 0325  NA 134* 137  138 137  K 3.6 3.6 4.4 4.0  CL 99 101 104 103  CO2 22 21* 23 27  GLUCOSE 133* 183* 152* 128*  BUN 7 9 14  21*  CREATININE 0.61 0.86 0.63 0.70  CALCIUM 9.3 9.8 8.9 8.7*   GFR: Estimated Creatinine Clearance: 102.3 mL/min (by C-G formula based on SCr of 0.7 mg/dL). Liver Function Tests: No results for input(s): AST, ALT, ALKPHOS, BILITOT, PROT, ALBUMIN in the last 168 hours. No results for input(s): LIPASE, AMYLASE in the last 168 hours. No results for input(s): AMMONIA in the last 168 hours. Coagulation Profile: No results for input(s): INR, PROTIME in the last 168 hours. Cardiac Enzymes: No results for input(s): CKTOTAL, CKMB, CKMBINDEX, TROPONINI in the last 168 hours. BNP (last 3 results) No results for input(s): PROBNP in the last 8760 hours. HbA1C: No results for input(s): HGBA1C in the last 72 hours. CBG: Recent Labs  Lab 04/11/18 1226  GLUCAP 164*   Lipid Profile: No results for input(s): CHOL, HDL, LDLCALC, TRIG, CHOLHDL, LDLDIRECT in the last 72 hours. Thyroid Function Tests: No results for input(s): TSH, T4TOTAL, FREET4, T3FREE, THYROIDAB in the last 72 hours. Anemia Panel: No results for input(s): VITAMINB12, FOLATE, FERRITIN, TIBC, IRON, RETICCTPCT in the last 72 hours.    Radiology Studies: I have reviewed all of the imaging during this hospital visit personally     Scheduled Meds: . ALPRAZolam  1 mg Oral BID  . dextromethorphan  15 mg Oral BID  . DULoxetine  60 mg Oral QHS  . enoxaparin (LOVENOX) injection  40 mg Subcutaneous Daily  . hydrOXYzine  25 mg Oral TID  . levalbuterol  1.25 mg Nebulization Q4H  . mometasone-formoterol  2 puff Inhalation BID  . nicotine  21 mg Transdermal Daily  . predniSONE  40 mg Oral QAC breakfast  . sodium chloride flush  3 mL Intravenous Q12H  . sodium chloride flush  3 mL Intravenous Q12H   Continuous Infusions: . sodium chloride       LOS: 2 days        Lisa Mccraw Gerome Apley, MD [facility] Pager [phone]

## 2018-04-12 NOTE — Care Management Note (Addendum)
Case Management Note  Patient Details  Name: Lisa Ryan MRN: [id number] Date of Birth: 26-Sep-1975  Subjective/Objective:    From home, patient states she would like to keep her Doctor that she has now, she will just need assistance with Match letter at discharge.   NCM gave patient Match Letter for med ast at [facility].   12/13 Tomi Bamberger RN, BSN- per pt eval rec HHPT and rollator vs SNF, NCM spoke with patient regarding this information, NCM checking with [facility] to see if she qualifies for charity HHPT and rollator, it is possible she may need home oxygen as well.  Await for Butch Penny with [facility] to get back with me regarding if she qaulifies for charity.  NCM notified Jermaine about the rollator and maybe home oxygen need.              Action/Plan: DC home when ready.   Expected Discharge Date:                  Expected Discharge Plan:  Home/Self Care  In-House Referral:     Discharge planning Services  CM Consult, [place] [facility], Follow-up appt scheduled, Medication Assistance  Post Acute Care Choice:    Choice offered to:     DME Arranged:    DME Agency:     HH Arranged:    HH Agency:     Status of Service:  In process, will continue to follow  If discussed at Long Length of Stay Meetings, dates discussed:    Additional Comments:  Zenon Mayo, RN 04/12/2018, 5:00 PM

## 2018-04-12 NOTE — Progress Notes (Signed)
Patient complaining about nose hurting. Explained to patient that because she's been on BIPAP continously for more than 48 hours that it can cause skin breakdown. I placed a second barrier on the bridge of her nose for comfort. RT will continue to monitor.

## 2018-04-12 NOTE — Progress Notes (Signed)
Pt woke up coughing violently, non-productive. Pt reports feeling air hungry & painful at site of left lung. Pt pulling off lines & nasal cannula. RN at bedside to assess. Pt sat 98% on 2 L [place]. RN assisted pt up to chair due to purewick leaking on bed. Pt given PRN neb tx.

## 2018-04-12 NOTE — Progress Notes (Signed)
NAME:  Lisa Ryan, MRN:  [id number], DOB:  05-10-1975, LOS: 2 ADMISSION DATE:  04/09/2018, CONSULTATION DATE:  04/10/2018 REFERRING MD:  Verlon Au, CHIEF COMPLAINT:  Acute respiratory failure   Brief History   42 year old female who recently was on a cruise where she was immobile for sometime and started feeling SOB while on the cruise specially with activity, also had swelling in both her legs and pain in the right leg from the knee down.  Patient came to the ED on 12/7 and was given steroids and zithromax and discharged.  While home, SOB became worse and patient was unable to speak in full sentence.  Patient was brought back to the ED and BiPAP was started.  Per the husband, patient is on a BiPAP and unable to speak clearly, per the husband patient felt warm and had significant diaphoresis with cough productive of yellow to green sputum.    History of present illness   42 year old female who recently was on a cruise where she was immobile for sometime and started feeling SOB while on the cruise specially with activity, also had swelling in both her legs and pain in the right leg from the knee down.  Patient came to the ED on 12/7 and was given steroids and zithromax and discharged.  While home, SOB became worse and patient was unable to speak in full sentence.  Patient was brought back to the ED and BiPAP was started.  Per the husband, patient is on a BiPAP and unable to speak clearly, per the husband patient felt warm and had significant diaphoresis with cough productive of yellow to green sputum  Past Medical History  COPD for which she is not receiving any medications (prescribed but not taken) Active smoker  [facility]   12/8 admission on BiPAP  Consults:  PCCM  Procedures:  None  Significant Diagnostic Tests:  CTA 12/9>>>  Micro Data:  Blood 12/9>>> Sputum 12/9>>>  Antimicrobials:  None   Interim history/subjective:  Off BiPAP this AM On [facility]  Objective    Blood pressure 108/62, pulse 62, temperature 98.3 F (36.8 C), temperature source Axillary, resp. rate 12, height 5\' 7"  (1.702 m), weight 88.2 kg, SpO2 92 %.    FiO2 (%):  [23 %] 23 %   Intake/Output Summary (Last 24 hours) at 04/12/2018 1140 Last data filed at 04/12/2018 5784 Gross per 24 hour  Intake 225 ml  Output 525 ml  Net -300 ml   Filed Weights   04/09/18 1731  Weight: 88.2 kg   Examination: General: More comfortable appearing this AM, NAD HENT: [place]/AT, PERRL, EOM-I and MMM Lungs: CTA bilaterally Cardiovascular: RRR, Nl S1/S2 and -M/R/G Abdomen: Soft, NT, ND and +BS Extremities: -edema and -tenderness Neuro: Alert and interactive, moving all ext to command Skin: Intact  Resolved Hospital Problem list   N/A  I reviewed chest CT myself, no acute disease noted  Assessment & Plan:  42 year old female with history of COPD who has recently arrived from a cruise and has had respiratory symptoms since with acute respiratory failure requiring BiPAP.  DDx of PE vs COPD exacerbation vs viral bronchitis resulting in respiratory failure.  Discussed with [facility]-MD and PCCM-NP.  I do believe anxiety is a major component here.    Acute respiratory failure:  - D/C BiPAP  - RVP positive for rhinoviruses  - BNP noted, no need for active diureses at this time  - Continue prednisone for total steroids  day of 7 days  - D/C levaquin  - Will need anxiolytics, on xanax, increase  Hypoxemia:  - Titrate O2 for sat of 88-92%  - CTA negative for PE  COPD:  - D/C levaquin  - PCT <0.10  - Steroids as ordered, change to PO prednisone, continue total of 7 days of steroids  Not sure when patient will leave, arrange f/u with Dr. [place] Bells upon discharge at the patient's request  PCCM will sign off, please call back if needed  Labs   CBC: Recent Labs  Lab 04/08/18 2246 04/10/18 0213 04/12/18 0325  WBC 13.9* 15.7* 12.1*  NEUTROABS 11.2* 14.0* 9.1*  HGB 14.0 12.8 12.4  HCT 41.6 39.2  37.8  MCV 98.3 99.0 99.5  PLT 379 308 016    Basic Metabolic Panel: Recent Labs  Lab 04/08/18 2246 04/09/18 0416 04/10/18 0213 04/12/18 0325  NA 134* 137 138 137  K 3.6 3.6 4.4 4.0  CL 99 101 104 103  CO2 22 21* 23 27  GLUCOSE 133* 183* 152* 128*  BUN 7 9 14  21*  CREATININE 0.61 0.86 0.63 0.70  CALCIUM 9.3 9.8 8.9 8.7*   GFR: Estimated Creatinine Clearance: 102.3 mL/min (by C-G formula based on SCr of 0.7 mg/dL). Recent Labs  Lab 04/08/18 2246 04/10/18 0213 04/12/18 0325  PROCALCITON  --  <0.10  --   WBC 13.9* 15.7* 12.1*    Liver Function Tests: No results for input(s): AST, ALT, ALKPHOS, BILITOT, PROT, ALBUMIN in the last 168 hours. No results for input(s): LIPASE, AMYLASE in the last 168 hours. No results for input(s): AMMONIA in the last 168 hours.  ABG    Component Value Date/Time   PHART 7.394 04/09/2018 0932   PCO2ART 32.7 04/09/2018 0932   PO2ART 98.0 04/09/2018 0932   HCO3 20.0 04/09/2018 0932   TCO2 21 (L) 04/09/2018 0932   ACIDBASEDEF 4.0 (H) 04/09/2018 0932   O2SAT 98.0 04/09/2018 0932     Coagulation Profile: No results for input(s): INR, PROTIME in the last 168 hours.  Cardiac Enzymes: No results for input(s): CKTOTAL, CKMB, CKMBINDEX, TROPONINI in the last 168 hours.  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   Surgical History    Past Surgical History:  Procedure Laterality Date  . ANKLE SURGERY     left  . APPENDECTOMY    . CESAREAN SECTION     x3  . CHOLECYSTECTOMY    . DILITATION & CURRETTAGE/HYSTROSCOPY WITH NOVASURE ABLATION N/A 12/26/2015   Procedure: DILATATION & CURETTAGE/HYSTEROSCOPY WITH ENDOMETRIAL ABLATION;  Surgeon: Jonnie Kind, MD;  Location: [facility];  Service: Gynecology;  Laterality: N/A;  . KNEE ARTHROSCOPY     left  . MOUTH LESION EXCISIONAL BIOPSY  04/2011   pre-cancerous, pallet of mouth     Social History   reports that she has been smoking cigarettes. She has a 20.00 pack-year smoking history. She has never used smokeless tobacco. She  reports that she does not drink alcohol or use drugs.   Family History   Her family history includes Cerebral aneurysm in her mother; Colon cancer in her maternal aunt, maternal grandmother, and mother; Diabetes in her sister; Diabetes (age of onset: 12) in her brother; Heart disease in her father; Kidney disease in her maternal grandmother; Liver disease in her father.   Allergies Allergies  Allergen Reactions  . Albuterol Anxiety and Other (See Comments)    Increased HR 130s  . Morphine Other (See Comments)    hallucinations     Home Medications  Prior to Admission medications   Medication Sig Start Date End Date Taking? Authorizing Provider  cholecalciferol (VITAMIN D3) 25 MCG (1000 UT) tablet Take 5,000 Units by mouth at bedtime.    [provider]  ciprofloxacin (CIPRO) 500 MG tablet Take 1 tablet (500 mg total) 2 (two) times daily by mouth. One po bid x 7 days Patient not taking: Reported on 04/08/2018 03/10/17   Ripley Fraise, MD  DULoxetine (CYMBALTA) 60 MG capsule Take 60 mg by mouth at bedtime.    [provider]  DULoxetine HCl (CYMBALTA PO) Take by mouth.    [provider]  fluticasone-salmeterol  (ADVAIR HFA) 115-21 MCG/ACT inhaler Inhale 2 puffs into the lungs 2 (two) times daily. Patient taking differently: Inhale 2 puffs into the lungs daily as needed (SOB).  10/30/13   Juanito Doom, MD  levalbuterol (XOPENEX) 0.63 MG/3ML nebulizer solution Take 3 mLs (0.63 mg total) by nebulization every 8 (eight) hours as needed for wheezing or shortness of breath. 04/09/18   Darlin Drop P, PA-C  metroNIDAZOLE (FLAGYL) 500 MG tablet Take 1 tablet (500 mg total) 2 (two) times daily by mouth. One po bid x 7 days Patient not taking: Reported on 04/08/2018 03/10/17   Ripley Fraise, MD  naproxen (NAPROSYN) 500 MG tablet TAKE 1 TABLET BY MOUTH TWICE DAILY WITH A MEAL Patient taking differently: Take 500 mg by mouth 2 (two) times daily as needed for mild pain.  06/13/16   Carole Civil, MD  Spacer/Aero-Holding Chambers (AEROCHAMBER MV) inhaler Use as instructed 10/30/13   Juanito Doom, MD    Rush Farmer, M.D. [facility]. Pager: [phone]. After hours pager: [phone].

## 2018-04-13 LAB — BASIC METABOLIC PANEL
Anion gap: 12 (ref 5–15)
Anion gap: 12 (ref 5–15)
BUN: 27 mg/dL — ABNORMAL HIGH (ref 6–20)
BUN: 26 mg/dL — ABNORMAL HIGH (ref 6–20)
CO2: 23 mmol/L (ref 22–32)
CO2: 24 mmol/L (ref 22–32)
Calcium: 8.7 mg/dL — ABNORMAL LOW (ref 8.9–10.3)
Calcium: 8.4 mg/dL — ABNORMAL LOW (ref 8.9–10.3)
Chloride: 104 mmol/L (ref 98–111)
Chloride: 102 mmol/L (ref 98–111)
Creatinine, Ser: 0.85 mg/dL (ref 0.44–1.00)
Creatinine, Ser: 0.9 mg/dL (ref 0.44–1.00)
GFR calc Af Amer: >60 mL/min (ref >60)
GFR calc Af Amer: >60 mL/min (ref >60)
GFR calc non Af Amer: >60 mL/min (ref >60)
GFR calc non Af Amer: >60 mL/min (ref >60)
Glucose, Bld: 102 mg/dL — ABNORMAL HIGH (ref 70–99)
Glucose, Bld: 96 mg/dL (ref 70–99)
Potassium: 2.9 mmol/L — ABNORMAL LOW (ref 3.5–5.1)
Potassium: 3.4 mmol/L — ABNORMAL LOW (ref 3.5–5.1)
SODIUM: 139 mmol/L (ref 135–145)
Sodium: 138 mmol/L (ref 135–145)

## 2018-04-13 LAB — GLUCOSE, CAPILLARY: Glucose-Capillary: 96 mg/dL (ref 70–99)

## 2018-04-13 MED ORDER — LORAZEPAM 1 MG PO TABS
1.0000 mg | ORAL_TABLET | Freq: Once | ORAL | Status: AC
  Administered 2018-04-13: 1 mg via ORAL

## 2018-04-13 MED ORDER — GUAIFENESIN-DM 100-10 MG/5ML PO SYRP
10.0000 mL | ORAL_SOLUTION | Freq: Four times a day (QID) | ORAL | Status: DC
  Administered 2018-04-13 – 2018-04-17 (×15): 10 mL via ORAL
  Filled 2018-04-13 (×16): qty 10

## 2018-04-13 MED ORDER — HYDROCODONE-ACETAMINOPHEN 5-325 MG PO TABS
1.0000 | ORAL_TABLET | Freq: Once | ORAL | Status: AC
  Administered 2018-04-13: 1 via ORAL

## 2018-04-13 MED ORDER — ALBUTEROL (5 MG/ML) CONTINUOUS INHALATION SOLN
10.0000 mg/h | INHALATION_SOLUTION | RESPIRATORY_TRACT | Status: DC
  Administered 2018-04-13: 10 mg/h via RESPIRATORY_TRACT
  Filled 2018-04-13: qty 20

## 2018-04-13 MED ORDER — HYDROCODONE-ACETAMINOPHEN 5-325 MG PO TABS
ORAL_TABLET | ORAL | Status: AC
  Administered 2018-04-13: 1 via ORAL
  Filled 2018-04-13: qty 1

## 2018-04-13 MED ORDER — LORAZEPAM 1 MG PO TABS
ORAL_TABLET | ORAL | Status: AC
  Administered 2018-04-13: 1 mg via ORAL
  Filled 2018-04-13: qty 1

## 2018-04-13 MED ORDER — HYDROCOD POLST-CPM POLST ER 10-8 MG/5ML PO SUER
5.0000 mL | Freq: Once | ORAL | Status: AC
  Administered 2018-04-13: 5 mL via ORAL
  Filled 2018-04-13: qty 5

## 2018-04-13 NOTE — Progress Notes (Addendum)
CRITICAL VALUE ALERT  Critical Value:  Potassium 2.9  Date & Time Notied:  04/13/18 0457  Provider Notified: Lethea Killings, NP via secure message page  Orders Received/Actions taken: Order for stat BMP placed 0521.

## 2018-04-13 NOTE — Progress Notes (Signed)
K Schorr paged the following at 0050:  "2W12:[place].RT at bedside w/ continuous neb.pt w/ persistant cough. pt. believes she pulled something on left side causing pain while coughing."

## 2018-04-13 NOTE — Progress Notes (Signed)
[facility] Technical brewer called back. Order placed for one time PO tussionex. Assessment ongoing.

## 2018-04-13 NOTE — Progress Notes (Signed)
Pt woke up coughing violently, non-productive. Pt reports feeling air hungry & painful at site of left lung. Pt pulling off lines & nasal cannula. RN at bedside to assess. Pt sat 98% on 2 L [place]. RN assisted pt up to chair due to purewick leaking on bed. Pt given PRN neb tx; no significant change post neb tx. Violent cough persists. RT at bedside. Wheezing auscultated, but diminished on left side. [facility] paged. Assessment ongoing.

## 2018-04-13 NOTE — Progress Notes (Signed)
PROGRESS NOTE    Lisa Ryan  TMH:[id number] DOB: August 29, 1975 DOA: 04/09/2018 PCP: Doree Albee, MD    Brief Narrative:  42 year old female who presented with dyspnea, productive cough, wheezing, nasal congestion and subjective fevers.  She does have the significant past medical history for asthma, anxiety and heart failure.  Her symptoms started 4 days prior to hospitalization with malaise and nasal congestion which progressed into dyspnea productive cough and wheezing.  She initially was seen in the emergency department, was treated with bronchodilators/ steroids and discharged home, she returned the next day due to persistent symptoms.  On her initial physical examination she was tachypneic, tachycardic, respiratory rate 30, heart rate 110, her temperature was 97.5, oxygen saturation 96%, she was showing signs of respiratory distress, prolonged expiratory phase with diffuse wheezing, heart S1-S2 present, tachycardic, abdomen soft nontender, no lower extremity edema.  Sodium 138, potassium 4.4, chloride 1 4, bicarbonate 30, glucose 152, BUN 14, creatinine 0.6, white count 15.7, hemo-12.8, hematocrit 39.3, platelets 308, chest radiograph with increased lung markings bilateral bases, CT negative, embolism.  Patient was admitted to the hospital with the working diagnosis of acute asthma exacerbation.   Assessment & Plan:   Principal Problem:   Asthma with acute exacerbation Active Problems:   Anxiety   Shortness of breath   Acute respiratory failure with hypoxemia (HCC)   Hypoxemia   1. Asthma exacerbation. Patient had worsening dyspnea last night with chest pain. Will continue to use as needed bipap, continue oxymetry monitoring and aggressive bronchodilator therapy. Systemic steroids with prednisone and will add guaifenesin dm scheduled for cough control. Patient very weak and deconditioned, follow with physical therapy recommendations. Continue dulera.   2. Anxiety. Continue as  needed alprazolam and scheduled duloxetine.  3. Tobacco abuse. On nicotine patch 21 mg, smoking cessation     DVT prophylaxis: enoxaparin   Code Status: full Family Communication:  Disposition Plan/ discharge barriers: Pending clinical improvement Body mass index is 30.46 kg/m. Malnutrition Type:      Malnutrition Characteristics:      Nutrition Interventions:     RN Pressure Injury Documentation:     Consultants:   Pulmonary   Procedures:     Antimicrobials:      Subjective: Patient continue to have dyspnea and wheezing, had worsening symptoms last night that required bipap, during the day yesterday tolerated well [place]. This am with left sided chest wall pain, moderate to severe in intensity.   Objective: Vitals:   04/13/18 0048 04/13/18 0315 04/13/18 0406 04/13/18 0803  BP:   132/69 98/65  Pulse:  (!) 104 88   Resp:  (!) 24 19 14   Temp:    98.7 F (37.1 C)  TempSrc:    Axillary  SpO2: 99% 95% 98% 97%  Weight:      Height:        Intake/Output Summary (Last 24 hours) at 04/13/2018 9211 Last data filed at 04/13/2018 0200 Gross per 24 hour  Intake -  Output 350 ml  Net -350 ml   Filed Weights   04/09/18 1731  Weight: 88.2 kg    Examination:   General: deconditioned  Neurology: Awake and alert, non focal  E ENT: mild  pallor, no icterus, oral mucosa moist Cardiovascular: No JVD. S1-S2 present, rhythmic, no gallops, rubs, or murmurs. No lower extremity edema. Pulmonary: decreased breath sounds bilaterally, prolonged expiration, with   wheezing, but no significant rhonchi or rales. Gastrointestinal. Abdomen with no organomegaly, non tender, no rebound or guarding  Skin. No rashes Musculoskeletal: no joint deformities     Data Reviewed: I have personally reviewed following labs and imaging studies  CBC: Recent Labs  Lab 04/08/18 2246 04/10/18 0213 04/12/18 0325  WBC 13.9* 15.7* 12.1*  NEUTROABS 11.2* 14.0* 9.1*  HGB 14.0 12.8  12.4  HCT 41.6 39.2 37.8  MCV 98.3 99.0 99.5  PLT 379 308 443   Basic Metabolic Panel: Recent Labs  Lab 04/09/18 0416 04/10/18 0213 04/12/18 0325 04/13/18 0312 04/13/18 0533  NA 137 138 137 139 138  K 3.6 4.4 4.0 2.9* 3.4*  CL 101 104 103 104 102  CO2 21* 23 27 23 24   GLUCOSE 183* 152* 128* 102* 96  BUN 9 14 21* 27* 26*  CREATININE 0.86 0.63 0.70 0.85 0.90  CALCIUM 9.8 8.9 8.7* 8.7* 8.4*   GFR: Estimated Creatinine Clearance: 90.9 mL/min (by C-G formula based on SCr of 0.9 mg/dL). Liver Function Tests: No results for input(s): AST, ALT, ALKPHOS, BILITOT, PROT, ALBUMIN in the last 168 hours. No results for input(s): LIPASE, AMYLASE in the last 168 hours. No results for input(s): AMMONIA in the last 168 hours. Coagulation Profile: No results for input(s): INR, PROTIME in the last 168 hours. Cardiac Enzymes: No results for input(s): CKTOTAL, CKMB, CKMBINDEX, TROPONINI in the last 168 hours. BNP (last 3 results) No results for input(s): PROBNP in the last 8760 hours. HbA1C: No results for input(s): HGBA1C in the last 72 hours. CBG: Recent Labs  Lab 04/11/18 1226 04/13/18 0806  GLUCAP 164* 96   Lipid Profile: No results for input(s): CHOL, HDL, LDLCALC, TRIG, CHOLHDL, LDLDIRECT in the last 72 hours. Thyroid Function Tests: No results for input(s): TSH, T4TOTAL, FREET4, T3FREE, THYROIDAB in the last 72 hours. Anemia Panel: No results for input(s): VITAMINB12, FOLATE, FERRITIN, TIBC, IRON, RETICCTPCT in the last 72 hours.    Radiology Studies: I have reviewed all of the imaging during this hospital visit personally     Scheduled Meds: . dextromethorphan  15 mg Oral BID  . DULoxetine  60 mg Oral QHS  . enoxaparin (LOVENOX) injection  40 mg Subcutaneous Daily  . hydrOXYzine  25 mg Oral TID  . ipratropium-albuterol  3 mL Nebulization Q6H  . mometasone-formoterol  2 puff Inhalation BID  . nicotine  21 mg Transdermal Daily  . predniSONE  40 mg Oral QAC breakfast    . sodium chloride flush  3 mL Intravenous Q12H  . sodium chloride flush  3 mL Intravenous Q12H   Continuous Infusions: . sodium chloride    . albuterol 10 mg/hr (04/13/18 0048)     LOS: 3 days        Mauricio Gerome Apley, MD [facility] Pager [phone]

## 2018-04-13 NOTE — Progress Notes (Signed)
Pt continues to cough violently, non-production. Expiratory wheezing audible on left side. Pt continues to report pain at left side of diaphragm. [facility] paged. Assessment ongoing.

## 2018-04-14 LAB — BASIC METABOLIC PANEL
Anion gap: 11 (ref 5–15)
BUN: 18 mg/dL (ref 6–20)
CO2: 27 mmol/L (ref 22–32)
Calcium: 8.7 mg/dL — ABNORMAL LOW (ref 8.9–10.3)
Chloride: 100 mmol/L (ref 98–111)
Creatinine, Ser: 0.74 mg/dL (ref 0.44–1.00)
GFR calc Af Amer: >60 mL/min (ref >60)
GFR calc non Af Amer: >60 mL/min (ref >60)
Glucose, Bld: 101 mg/dL — ABNORMAL HIGH (ref 70–99)
Potassium: 3.5 mmol/L (ref 3.5–5.1)
Sodium: 138 mmol/L (ref 135–145)

## 2018-04-14 LAB — GLUCOSE, CAPILLARY: Glucose-Capillary: 88 mg/dL (ref 70–99)

## 2018-04-14 MED ORDER — ALPRAZOLAM 0.5 MG PO TABS
0.5000 mg | ORAL_TABLET | Freq: Three times a day (TID) | ORAL | Status: DC | PRN
  Administered 2018-04-14 – 2018-04-17 (×8): 0.5 mg via ORAL
  Filled 2018-04-14 (×8): qty 1

## 2018-04-14 NOTE — Progress Notes (Addendum)
SATURATION QUALIFICATIONS: (This note is used to comply with regulatory documentation for home oxygen)  Patient Saturations on Room Air at Rest = 95%  Patient Saturations on Room Air while Ambulating = 84%  Patient Saturations on 3 Liters of oxygen while Ambulating = 94%  Please briefly explain why patient needs home oxygen: Patient has severe activity intolerance. She complains of lighteheadedness and dizziness and cannot maintain Oxygen sats >85% on room air during activity.

## 2018-04-14 NOTE — Progress Notes (Signed)
PROGRESS NOTE    Lisa Ryan  FOY:[id number] DOB: 01-25-1976 DOA: 04/09/2018 PCP: Doree Albee, MD    Brief Narrative:  42 year old female with history of anxiety, COPD, tobacco abuse presented to the ED with cough congestion shortness of breath wheezing subjective fevers and chills -She was negative for influenza, respiratory virus panel was positive for rhinovirus -Treated with BiPAP initially   Assessment & Plan:  1. COPD exacerbation/positive rhinovirus -required BiPAP on admission, now off -Respiratory virus panel was positive for rhinovirus, antibiotics discontinued -Continue prednisone, nebs and antitussives -Ambulate, wean O2 -Discharge planning -Very slow to progress, psychosocial factors contributing  2. Anxiety.  -Continue PRN Xanax, I have lowered the dose, continue duloxetine.  3. Tobacco abuse -nicotine patch 21 mg, smoking cessation     DVT prophylaxis: enoxaparin   Code Status: full Family Communication:  Spouse at bedside Disposition Plan/ discharge barriers:  Home later today or in a.m.  Consultants:   Pulmonary   Procedures:     Antimicrobials:      Subjective: -Breathing improving, has not gotten out of bed -Continues to complain of cough Objective: Vitals:   04/14/18 0430 04/14/18 0748 04/14/18 0836 04/14/18 1158  BP: 102/71 112/64    Pulse: 63  65   Resp: 15 16 18 15   Temp: 97.7 F (36.5 C) (!) 97.5 F (36.4 C)  98.1 F (36.7 C)  TempSrc: Oral Oral  Oral  SpO2: 98%  97% 96%  Weight:      Height:       No intake or output data in the 24 hours ending 04/14/18 1334 Filed Weights   04/09/18 1731  Weight: 88.2 kg    Examination:   Gen: Obese deconditioned female, sitting up in bed, no distress HEENT: PERRLA, Neck supple, no JVD Lungs: Poor air movement, rare expiratory wheezes CVS: RRR,No Gallops,Rubs or new Murmurs Abd: soft, Non tender, non distended, BS present Extremities: No edema Skin: no new  rashes     Data Reviewed: I have personally reviewed following labs and imaging studies  CBC: Recent Labs  Lab 04/08/18 2246 04/10/18 0213 04/12/18 0325  WBC 13.9* 15.7* 12.1*  NEUTROABS 11.2* 14.0* 9.1*  HGB 14.0 12.8 12.4  HCT 41.6 39.2 37.8  MCV 98.3 99.0 99.5  PLT 379 308 767   Basic Metabolic Panel: Recent Labs  Lab 04/10/18 0213 04/12/18 0325 04/13/18 0312 04/13/18 0533 04/14/18 0259  NA 138 137 139 138 138  K 4.4 4.0 2.9* 3.4* 3.5  CL 104 103 104 102 100  CO2 23 27 23 24 27   GLUCOSE 152* 128* 102* 96 101*  BUN 14 21* 27* 26* 18  CREATININE 0.63 0.70 0.85 0.90 0.74  CALCIUM 8.9 8.7* 8.7* 8.4* 8.7*   GFR: Estimated Creatinine Clearance: 102.3 mL/min (by C-G formula based on SCr of 0.74 mg/dL). Liver Function Tests: No results for input(s): AST, ALT, ALKPHOS, BILITOT, PROT, ALBUMIN in the last 168 hours. No results for input(s): LIPASE, AMYLASE in the last 168 hours. No results for input(s): AMMONIA in the last 168 hours. Coagulation Profile: No results for input(s): INR, PROTIME in the last 168 hours. Cardiac Enzymes: No results for input(s): CKTOTAL, CKMB, CKMBINDEX, TROPONINI in the last 168 hours. BNP (last 3 results) No results for input(s): PROBNP in the last 8760 hours. HbA1C: No results for input(s): HGBA1C in the last 72 hours. CBG: Recent Labs  Lab 04/11/18 1226 04/13/18 0806 04/14/18 0752  GLUCAP 164* 96 88   Lipid Profile: No results  for input(s): CHOL, HDL, LDLCALC, TRIG, CHOLHDL, LDLDIRECT in the last 72 hours. Thyroid Function Tests: No results for input(s): TSH, T4TOTAL, FREET4, T3FREE, THYROIDAB in the last 72 hours. Anemia Panel: No results for input(s): VITAMINB12, FOLATE, FERRITIN, TIBC, IRON, RETICCTPCT in the last 72 hours.    Radiology Studies: I have reviewed all of the imaging during this hospital visit personally     Scheduled Meds: . DULoxetine  60 mg Oral QHS  . enoxaparin (LOVENOX) injection  40 mg  Subcutaneous Daily  . guaiFENesin-dextromethorphan  10 mL Oral Q6H  . hydrOXYzine  25 mg Oral TID  . ipratropium-albuterol  3 mL Nebulization Q6H  . mometasone-formoterol  2 puff Inhalation BID  . nicotine  21 mg Transdermal Daily  . predniSONE  40 mg Oral QAC breakfast  . sodium chloride flush  3 mL Intravenous Q12H  . sodium chloride flush  3 mL Intravenous Q12H   Continuous Infusions: . sodium chloride    . albuterol 10 mg/hr (04/13/18 0048)     LOS: 4 days    Domenic Polite, MD [facility] Page via [email or web address]

## 2018-04-14 NOTE — Evaluation (Signed)
Physical Therapy Evaluation Patient Details Name: Lisa Ryan MRN: [id number] DOB: 1976/04/03 Today's Date: 04/14/2018   History of Present Illness  Pt is a 42 y/o female admitted secondary to increased dyspnea, cough and wheezing. Thought to be secondary to asthma exacerbation. PMH includes anxiety, asthma, and heart failure.   Clinical Impression  Pt admitted secondary to problem above with deficits below. Pt performed multiple standing trials throughout session to attempt gait and for clean up, however, pt with very poor tolerance and forceful coughing. Required mod A for assisted lowering back to bed following coughing spells in standing. Required min to mod A for standing trials with RW this session. Oxygen sats >90% on RA throughout session, however, DOE at 3/4. Pt very adamant about going home today, and required extensive education about safety concerns. [place] pt is at increased risk for falls and will be unable to perform necessary mobility tasks. Pt may progress with mobility if mobility tolerance improves, however, if she does not improve, may require post acute rehab. Pt will likely decline, however, as she is adamant about returning home. Will continue to follow acutely to maximize functional mobility independence and safety.     Follow Up Recommendations Supervision/Assistance - 24 hour(HHPT vs SNF pending progress)    Equipment Recommendations  Other (comment)(rollator)    Recommendations for Other Services OT consult     Precautions / Restrictions Precautions Precautions: Fall Restrictions Weight Bearing Restrictions: No      Mobility  Bed Mobility Overal bed mobility: Needs Assistance Bed Mobility: Supine to Sit;Sit to Supine     Supine to sit: Supervision Sit to supine: Supervision   General bed mobility comments: Supervision and increased time for safety. Oxygen sats ranging from 91-95% on RA upon sitting, however, pt with increased SOB and coughing.    Transfers Overall transfer level: Needs assistance Equipment used: Rolling walker (2 wheeled) Transfers: Sit to/from Stand Sit to Stand: Min assist;Mod assist         General transfer comment: Stood multiple times during session for clean up and to attempt ambulation. Pt with increased fatigue and coughing and would throw herself backwards. Required mod A to assist with lowering. With progressive standing attempts, pt requiring increased assist. Pt insistent on ambulating, and required extensive education about why that was not safe to attempt at this time. Husband present and also attempted to educate. Oxygen sats >90% on RA throughout.   Ambulation/Gait             General Gait Details: unable  Stairs            Wheelchair Mobility    Modified Rankin (Stroke Patients Only)       Balance Overall balance assessment: Needs assistance Sitting-balance support: No upper extremity supported;Feet supported Sitting balance-Leahy Scale: Fair     Standing balance support: Bilateral upper extremity supported;During functional activity Standing balance-Leahy Scale: Poor Standing balance comment: Heavy reliance on UE support.                              Pertinent Vitals/Pain Pain Assessment: Faces Faces Pain Scale: Hurts little more Pain Location: L chest Pain Descriptors / Indicators: Grimacing;Guarding;Sharp Pain Intervention(s): Limited activity within patient's tolerance;Monitored during session;Repositioned    Home Living Family/patient expects to be discharged to:: Private residence Living Arrangements: Spouse/significant other Available Help at Discharge: Family;Available PRN/intermittently Type of Home: House Home Access: Stairs to enter Entrance Stairs-Rails: Left Entrance Stairs-Number of  Steps: 3 Home Layout: Two level;Able to live on main level with bedroom/bathroom Home Equipment: None      Prior Function Level of Independence:  Independent         Comments: 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      Hand Dominance        Extremity/Trunk Assessment   Upper Extremity Assessment Upper Extremity Assessment: Defer to OT evaluation    Lower Extremity Assessment Lower Extremity Assessment: Generalized weakness    Cervical / Trunk Assessment Cervical / Trunk Assessment: Kyphotic  Communication   Communication: No difficulties  Cognition Arousal/Alertness: Awake/alert Behavior During Therapy: WFL for tasks assessed/performed Overall Cognitive Status: Within Functional Limits for tasks assessed                                        General Comments General comments (skin integrity, edema, etc.): Pt very adamant about going home. Extensive education provided about safety concerns.     Exercises     Assessment/Plan    PT Assessment Patient needs continued PT services  PT Problem List Decreased strength;Decreased balance;Decreased activity tolerance;Decreased mobility;Decreased knowledge of use of DME;Cardiopulmonary status limiting activity;Decreased knowledge of precautions;Pain       PT Treatment Interventions DME instruction;Stair training;Gait training;Functional mobility training;Therapeutic activities;Therapeutic exercise;Balance training;Patient/family education    PT Goals (Current goals can be found in the Care Plan section)  Acute Rehab PT Goals Patient Stated Goal: to go home today PT Goal Formulation: With patient Time For Goal Achievement: 04/28/18 Potential to Achieve Goals: Good    Frequency Min 3X/week   Barriers to discharge Decreased caregiver support      Co-evaluation               AM-PAC PT "6 Clicks" Mobility  Outcome Measure Help needed turning from your back to your side while in a flat bed without using bedrails?: None Help needed moving from lying on your back to sitting on the side of a flat bed without using bedrails?:  None Help needed moving to and from a bed to a chair (including a wheelchair)?: A Lot Help needed standing up from a chair using your arms (e.g., wheelchair or bedside chair)?: A Lot Help needed to walk in hospital room?: A Lot Help needed climbing 3-5 steps with a railing? : A Lot 6 Click Score: 16    End of Session Equipment Utilized During Treatment: Gait belt Activity Tolerance: Treatment limited secondary to medical complications (Comment)(SOB, coughing ) Patient left: in bed;with call bell/phone within reach;with family/visitor present Nurse Communication: Mobility status PT Visit Diagnosis: Other abnormalities of gait and mobility (R26.89);Muscle weakness (generalized) (M62.81);Difficulty in walking, not elsewhere classified (R26.2)    Time: 7681-1572 PT Time Calculation (min) (ACUTE ONLY): 43 min   Charges:   PT Evaluation $PT Eval Moderate Complexity: 1 Mod PT Treatments $Therapeutic Activity: 23-37 mins        Leighton Ruff, PT, DPT  Acute Rehabilitation Services  Pager: [phone] Office: [phone]   Rudean Hitt 04/14/2018, 9:55 AM

## 2018-04-15 ENCOUNTER — Inpatient Hospital Stay (HOSPITAL_COMMUNITY): Payer: Self-pay

## 2018-04-15 LAB — GLUCOSE, CAPILLARY: Glucose-Capillary: 89 mg/dL (ref 70–99)

## 2018-04-15 MED ORDER — BENZONATATE 100 MG PO CAPS
100.0000 mg | ORAL_CAPSULE | Freq: Three times a day (TID) | ORAL | Status: DC
  Administered 2018-04-15 – 2018-04-17 (×6): 100 mg via ORAL
  Filled 2018-04-15 (×6): qty 1

## 2018-04-15 MED ORDER — LORAZEPAM 2 MG/ML IJ SOLN
INTRAMUSCULAR | Status: AC
  Administered 2018-04-15: 1 mg via INTRAVENOUS
  Filled 2018-04-15: qty 1

## 2018-04-15 MED ORDER — LORAZEPAM 2 MG/ML IJ SOLN
1.0000 mg | Freq: Once | INTRAMUSCULAR | Status: AC
  Administered 2018-04-15: 1 mg via INTRAVENOUS

## 2018-04-15 NOTE — Progress Notes (Signed)
Physical Therapy Treatment Patient Details Name: Lisa Ryan MRN: [id number] DOB: 1975/06/25 Today's Date: 04/15/2018    History of Present Illness Pt is a 42 y/o female admitted secondary to increased dyspnea, cough and wheezing. Thought to be secondary to asthma exacerbation. PMH includes anxiety, asthma, and heart failure.     PT Comments    Patient progressing slowly with therapy, limited to bed exercises and education of IS this visit as she states she has had a rough morning, having had to use bipap earlier. Discussed goals and hopes to ambulate later today if time permits for therapy to return. Pt agreeable.      Follow Up Recommendations  Supervision/Assistance - 24 hour     Equipment Recommendations  Other (comment)    Recommendations for Other Services OT consult     Precautions / Restrictions Precautions Precautions: Fall Precaution Comments: watch O2 Restrictions Weight Bearing Restrictions: No    Mobility  Bed Mobility Overal bed mobility: Needs Assistance Bed Mobility: Supine to Sit;Sit to Supine     Supine to sit: Supervision Sit to supine: Supervision      Transfers                    Ambulation/Gait             General Gait Details: unable   Stairs             Wheelchair Mobility    Modified Rankin (Stroke Patients Only)       Balance Overall balance assessment: Needs assistance Sitting-balance support: No upper extremity supported;Feet supported Sitting balance-Leahy Scale: Fair                                      Cognition Arousal/Alertness: Awake/alert Behavior During Therapy: WFL for tasks assessed/performed Overall Cognitive Status: Within Functional Limits for tasks assessed                                        Exercises General Exercises - Lower Extremity Ankle Circles/Pumps: 20 reps Heel Slides: 20 reps Hip ABduction/ADduction: 20 reps Straight Leg Raises:  20 reps    General Comments General comments (skin integrity, edema, etc.): provived education for IS use      Pertinent Vitals/Pain Pain Assessment: Faces Faces Pain Scale: Hurts a little bit Pain Location: L chest Pain Descriptors / Indicators: Grimacing;Guarding;[place]                      Prior Function            PT Goals (current goals can now be found in the care plan section) Acute Rehab PT Goals Patient Stated Goal: to feel better PT Goal Formulation: With patient Time For Goal Achievement: 04/28/18 Potential to Achieve Goals: Good    Frequency    Min 3X/week      PT Plan Current plan remains appropriate    Co-evaluation              AM-PAC PT "6 Clicks" Mobility   Outcome Measure  Help needed turning from your back to your side while in a flat bed without using bedrails?: None Help needed moving from lying on your back to sitting on the side of a flat bed without using  bedrails?: None Help needed moving to and from a bed to a chair (including a wheelchair)?: A Lot Help needed standing up from a chair using your arms (e.g., wheelchair or bedside chair)?: A Lot Help needed to walk in hospital room?: A Lot Help needed climbing 3-5 steps with a railing? : A Lot 6 Click Score: 16    End of Session   Activity Tolerance: Treatment limited secondary to medical complications (Comment) Patient left: in bed;with call bell/phone within reach;with family/visitor present Nurse Communication: Mobility status PT Visit Diagnosis: Other abnormalities of gait and mobility (R26.89);Muscle weakness (generalized) (M62.81);Difficulty in walking, not elsewhere classified (R26.2)     Time: 1747-1595 PT Time Calculation (min) (ACUTE ONLY): 12 min  Charges:  $Therapeutic Exercise: 8-22 mins                     Reinaldo Berber, PT, DPT Acute Rehabilitation Services Pager: [phone] Office: [phone]     Reinaldo Berber 04/15/2018,  10:30 AM

## 2018-04-15 NOTE — Progress Notes (Signed)
Physical Therapy Treatment Patient Details Name: Lisa Ryan MRN: [id number] DOB: [birth year] Today's Date: 04/15/2018    History of Present Illness Pt is a 42 y/o female admitted secondary to increased dyspnea, cough and wheezing. Thought to be secondary to asthma exacerbation. PMH includes anxiety, asthma, and heart failure.     PT Comments    Patient feeling better this evening, agreeable to OOB mobility. Walked short distance to door and back, very unsteady at times. WNL SpO2 on 2L in limited distance. Pt will have 24/7 assistance at home to provide proper guarding.      Follow Up Recommendations  Supervision/Assistance - 24 hour     Equipment Recommendations  Other (comment)    Recommendations for Other Services OT consult     Precautions / Restrictions Precautions Precautions: Fall Precaution Comments: watch O2 Restrictions Weight Bearing Restrictions: No    Mobility  Bed Mobility Overal bed mobility: Needs Assistance Bed Mobility: Supine to Sit;Sit to Supine     Supine to sit: Supervision Sit to supine: Supervision   General bed mobility comments: Supervision and increased time for safety. Oxygen sats ranging from 91-95% on RA upon sitting, however, pt with increased SOB and coughing.   Transfers Overall transfer level: Needs assistance Equipment used: None   Sit to Stand: Min assist;Mod assist            Ambulation/Gait Ambulation/Gait assistance: Min guard;Min assist Gait Distance (Feet): 15 Feet   Gait Pattern/deviations: Step-to pattern;Step-through pattern Gait velocity: decreased   General Gait Details: Patient ambulating 2L with WNL SpO2. several LOB requring UE support for stability.    Stairs             Wheelchair Mobility    Modified Rankin (Stroke Patients Only)       Balance Overall balance assessment: Needs assistance Sitting-balance support: No upper extremity supported;Feet supported Sitting balance-Leahy  Scale: Fair       Standing balance-Leahy Scale: Poor                              Cognition Arousal/Alertness: Awake/alert Behavior During Therapy: WFL for tasks assessed/performed Overall Cognitive Status: Within Functional Limits for tasks assessed                                        Exercises      General Comments        Pertinent Vitals/Pain Pain Assessment: Faces Faces Pain Scale: Hurts a little bit Pain Location: L chest Pain Descriptors / Indicators: Grimacing;Guarding;Sharp Pain Intervention(s): Limited activity within patient's tolerance;Monitored during session;Premedicated before session    Home Living                      Prior Function            PT Goals (current goals can now be found in the care plan section) Acute Rehab PT Goals Patient Stated Goal: to feel better PT Goal Formulation: With patient Time For Goal Achievement: 04/28/18 Potential to Achieve Goals: Good Progress towards PT goals: Progressing toward goals    Frequency    Min 3X/week      PT Plan Current plan remains appropriate    Co-evaluation              AM-PAC PT "6 Clicks" Mobility   Outcome Measure  Help needed turning from your back to your side while in a flat bed without using bedrails?: None Help needed moving from lying on your back to sitting on the side of a flat bed without using bedrails?: None Help needed moving to and from a bed to a chair (including a wheelchair)?: A Lot Help needed standing up from a chair using your arms (e.g., wheelchair or bedside chair)?: A Lot Help needed to walk in hospital room?: A Lot Help needed climbing 3-5 steps with a railing? : A Lot 6 Click Score: 16    End of Session Equipment Utilized During Treatment: Gait belt Activity Tolerance: Treatment limited secondary to medical complications (Comment) Patient left: in bed;with call bell/phone within reach;with family/visitor  present Nurse Communication: Mobility status PT Visit Diagnosis: Other abnormalities of gait and mobility (R26.89);Muscle weakness (generalized) (M62.81);Difficulty in walking, not elsewhere classified (R26.2)     Time: 1510-1530 PT Time Calculation (min) (ACUTE ONLY): 20 min  Charges:  $Gait Training: 8-22 mins                     Reinaldo Berber, PT, DPT Acute Rehabilitation Services Pager: [phone] Office: [phone]     Reinaldo Berber 04/15/2018, 4:06 PM

## 2018-04-15 NOTE — Progress Notes (Signed)
PROGRESS NOTE    Lisa Ryan  CXK:[id number] DOB: 04/04/1976 DOA: 04/09/2018 PCP: Doree Albee, MD    Brief Narrative:  42 year old female with history of anxiety, COPD, tobacco abuse presented to the ED with cough congestion shortness of breath wheezing subjective fevers and chills -She was negative for influenza, respiratory virus panel was positive for rhinovirus -Treated with BiPAP initially   Assessment & Plan:  1. COPD exacerbation/positive rhinovirus -required BiPAP on admission, now off -Respiratory virus panel was positive for rhinovirus, antibiotics discontinued -Very slow progress, anxiety also contributing, required BiPAP again last night, repeat chest x-ray -Continue taper prednisone, duo nebs and antitussives -Attempt to wean O2, ambulate, incentive spirometer -Charge planning, anticipate will need home O2  2. Anxiety.  -Continue PRN Xanax,, continue duloxetine.  3. Tobacco abuse -nicotine patch 21 mg, smoking cessation     DVT prophylaxis: enoxaparin   Code Status: full Family Communication:  Spouse at bedside Disposition Plan/ discharge barriers: home tomorrow  Consultants:   Pulmonary   Procedures:     Antimicrobials:      Subjective: -Became very short of breath last night, requiring BiPAP Objective: Vitals:   04/15/18 0210 04/15/18 0212 04/15/18 0725 04/15/18 0832  BP:   101/82   Pulse:  85 72 75  Resp:  (!) 32 19 18  Temp:   98.7 F (37.1 C)   TempSrc:   Oral   SpO2: 100% 100% 100% 98%  Weight:      Height:        Intake/Output Summary (Last 24 hours) at 04/15/2018 1318 Last data filed at 04/14/2018 1740 Gross per 24 hour  Intake -  Output 550 ml  Net -550 ml   Filed Weights   04/09/18 1731  Weight: 88.2 kg    Examination:   Gen: Obese deconditioned female, laying in bed, no distress HEENT: PERRLA, Neck supple, no JVD Lungs: Improving air movement, no expiratory wheezes  CVS: RRR,No Gallops,Rubs or new  Murmurs Abd: soft, Non tender, non distended, BS present Extremities: No Cyanosis, Clubbing or edema Skin: no new rashes     Data Reviewed: I have personally reviewed following labs and imaging studies  CBC: Recent Labs  Lab 04/08/18 2246 04/10/18 0213 04/12/18 0325  WBC 13.9* 15.7* 12.1*  NEUTROABS 11.2* 14.0* 9.1*  HGB 14.0 12.8 12.4  HCT 41.6 39.2 37.8  MCV 98.3 99.0 99.5  PLT 379 308 970   Basic Metabolic Panel: Recent Labs  Lab 04/10/18 0213 04/12/18 0325 04/13/18 0312 04/13/18 0533 04/14/18 0259  NA 138 137 139 138 138  K 4.4 4.0 2.9* 3.4* 3.5  CL 104 103 104 102 100  CO2 23 27 23 24 27   GLUCOSE 152* 128* 102* 96 101*  BUN 14 21* 27* 26* 18  CREATININE 0.63 0.70 0.85 0.90 0.74  CALCIUM 8.9 8.7* 8.7* 8.4* 8.7*   GFR: Estimated Creatinine Clearance: 102.3 mL/min (by C-G formula based on SCr of 0.74 mg/dL). Liver Function Tests: No results for input(s): AST, ALT, ALKPHOS, BILITOT, PROT, ALBUMIN in the last 168 hours. No results for input(s): LIPASE, AMYLASE in the last 168 hours. No results for input(s): AMMONIA in the last 168 hours. Coagulation Profile: No results for input(s): INR, PROTIME in the last 168 hours. Cardiac Enzymes: No results for input(s): CKTOTAL, CKMB, CKMBINDEX, TROPONINI in the last 168 hours. BNP (last 3 results) No results for input(s): PROBNP in the last 8760 hours. HbA1C: No results for input(s): HGBA1C in the last 72 hours. CBG:  Recent Labs  Lab 04/11/18 1226 04/13/18 0806 04/14/18 0752 04/15/18 0823  GLUCAP 164* 96 88 89   Lipid Profile: No results for input(s): CHOL, HDL, LDLCALC, TRIG, CHOLHDL, LDLDIRECT in the last 72 hours. Thyroid Function Tests: No results for input(s): TSH, T4TOTAL, FREET4, T3FREE, THYROIDAB in the last 72 hours. Anemia Panel: No results for input(s): VITAMINB12, FOLATE, FERRITIN, TIBC, IRON, RETICCTPCT in the last 72 hours.    Radiology Studies: I have reviewed all of the imaging during  this hospital visit personally     Scheduled Meds: . DULoxetine  60 mg Oral QHS  . enoxaparin (LOVENOX) injection  40 mg Subcutaneous Daily  . guaiFENesin-dextromethorphan  10 mL Oral Q6H  . hydrOXYzine  25 mg Oral TID  . ipratropium-albuterol  3 mL Nebulization Q6H  . mometasone-formoterol  2 puff Inhalation BID  . nicotine  21 mg Transdermal Daily  . sodium chloride flush  3 mL Intravenous Q12H  . sodium chloride flush  3 mL Intravenous Q12H   Continuous Infusions: . sodium chloride    . albuterol 10 mg/hr (04/13/18 0048)     LOS: 5 days    Domenic Polite, MD [facility] Page via [email or web address]

## 2018-04-16 ENCOUNTER — Inpatient Hospital Stay (HOSPITAL_COMMUNITY): Payer: Self-pay

## 2018-04-16 LAB — CREATININE, SERUM
Creatinine, Ser: 0.77 mg/dL (ref 0.44–1.00)
GFR calc Af Amer: >60 mL/min (ref >60)
GFR calc non Af Amer: >60 mL/min (ref >60)

## 2018-04-16 LAB — GLUCOSE, CAPILLARY: Glucose-Capillary: 80 mg/dL (ref 70–99)

## 2018-04-16 MED ORDER — LORAZEPAM 2 MG/ML IJ SOLN
1.0000 mg | Freq: Once | INTRAMUSCULAR | Status: AC
  Administered 2018-04-16: 1 mg via INTRAVENOUS
  Filled 2018-04-16: qty 1

## 2018-04-16 MED ORDER — ZOLPIDEM TARTRATE 5 MG PO TABS
10.0000 mg | ORAL_TABLET | Freq: Every day | ORAL | Status: DC
  Administered 2018-04-16: 10 mg via ORAL
  Filled 2018-04-16: qty 2

## 2018-04-16 MED ORDER — HYDROXYZINE HCL 10 MG PO TABS
10.0000 mg | ORAL_TABLET | Freq: Three times a day (TID) | ORAL | Status: DC
  Administered 2018-04-16 – 2018-04-17 (×3): 10 mg via ORAL
  Filled 2018-04-16 (×4): qty 1

## 2018-04-16 MED ORDER — IPRATROPIUM-ALBUTEROL 0.5-2.5 (3) MG/3ML IN SOLN
3.0000 mL | Freq: Three times a day (TID) | RESPIRATORY_TRACT | Status: DC
  Administered 2018-04-17: 3 mL via RESPIRATORY_TRACT
  Filled 2018-04-16: qty 3

## 2018-04-16 NOTE — Progress Notes (Signed)
PROGRESS NOTE    Lisa Ryan  JIR:[id number] DOB: 23-Nov-1975 DOA: 04/09/2018 PCP: Doree Albee, MD    Brief Narrative:  42 year old female with history of anxiety, COPD, tobacco abuse presented to the ED with cough congestion shortness of breath wheezing subjective fevers and chills -She was negative for influenza, respiratory virus panel was positive for rhinovirus -Treated with BiPAP initially   Assessment & Plan:  1. COPD exacerbation/positive rhinovirus -required BiPAP on admission, now off -Respiratory virus panel was positive for rhinovirus, antibiotics discontinued -Very slow progress, anxiety also contributing, required BiPAP overnight for the last 2 nights following coughing spells and secondary anxiety contributing  -Repeat chest x-ray with atelectasis only -Continue prednisone taper, duo nebs, added Tessalon Perles yesterday -Increase activity, ambulate, incentive spirometry recommended -Add Ambien nightly  2. Anxiety.  -Continue PRN Xanax,, continue duloxetine.  3. Tobacco abuse -nicotine patch 21 mg, smoking cessation     DVT prophylaxis: enoxaparin   Code Status: full Family Communication:  Spouse at bedside Disposition Plan/ discharge barriers: home tomorrow if stable  Consultants:   Pulmonary   Procedures:     Antimicrobials:      Subjective: -Overnight coughing spell with anxiety requiring BiPAP for 3 4 hours -Breathing improved and now off BiPAP again  Objective: Vitals:   04/16/18 0230 04/16/18 0420 04/16/18 0747 04/16/18 0807  BP:   101/68   Pulse: 90 90 73 85  Resp: 20 20 18 18   Temp:   97.8 F (36.6 C)   TempSrc:   Oral   SpO2: 100% 100%  100%  Weight:      Height:        Intake/Output Summary (Last 24 hours) at 04/16/2018 1137 Last data filed at 04/16/2018 7510 Gross per 24 hour  Intake 240 ml  Output -  Net 240 ml   Filed Weights   04/09/18 1731  Weight: 88.2 kg    Examination:   Gen: Obese,  deconditioned chronically ill female laying in bed, no distress HEENT: PERRLA, Neck supple, no JVD Lungs: Improving air movement, no expiratory wheezes CVS: RRR,No Gallops,Rubs or new Murmurs Abd: soft, Non tender, non distended, BS present Extremities: No edema Skin: no new rashes     Data Reviewed: I have personally reviewed following labs and imaging studies  CBC: Recent Labs  Lab 04/10/18 0213 04/12/18 0325  WBC 15.7* 12.1*  NEUTROABS 14.0* 9.1*  HGB 12.8 12.4  HCT 39.2 37.8  MCV 99.0 99.5  PLT 308 258   Basic Metabolic Panel: Recent Labs  Lab 04/10/18 0213 04/12/18 0325 04/13/18 0312 04/13/18 0533 04/14/18 0259 04/16/18 0527  NA 138 137 139 138 138  --   K 4.4 4.0 2.9* 3.4* 3.5  --   CL 104 103 104 102 100  --   CO2 23 27 23 24 27   --   GLUCOSE 152* 128* 102* 96 101*  --   BUN 14 21* 27* 26* 18  --   CREATININE 0.63 0.70 0.85 0.90 0.74 0.77  CALCIUM 8.9 8.7* 8.7* 8.4* 8.7*  --    GFR: Estimated Creatinine Clearance: 102.3 mL/min (by C-G formula based on SCr of 0.77 mg/dL). Liver Function Tests: No results for input(s): AST, ALT, ALKPHOS, BILITOT, PROT, ALBUMIN in the last 168 hours. No results for input(s): LIPASE, AMYLASE in the last 168 hours. No results for input(s): AMMONIA in the last 168 hours. Coagulation Profile: No results for input(s): INR, PROTIME in the last 168 hours. Cardiac Enzymes: No results for  input(s): CKTOTAL, CKMB, CKMBINDEX, TROPONINI in the last 168 hours. BNP (last 3 results) No results for input(s): PROBNP in the last 8760 hours. HbA1C: No results for input(s): HGBA1C in the last 72 hours. CBG: Recent Labs  Lab 04/11/18 1226 04/13/18 0806 04/14/18 0752 04/15/18 0823 04/16/18 0743  GLUCAP 164* 96 88 89 80   Lipid Profile: No results for input(s): CHOL, HDL, LDLCALC, TRIG, CHOLHDL, LDLDIRECT in the last 72 hours. Thyroid Function Tests: No results for input(s): TSH, T4TOTAL, FREET4, T3FREE, THYROIDAB in the last 72  hours. Anemia Panel: No results for input(s): VITAMINB12, FOLATE, FERRITIN, TIBC, IRON, RETICCTPCT in the last 72 hours.    Radiology Studies: I have reviewed all of the imaging during this hospital visit personally     Scheduled Meds: . benzonatate  100 mg Oral TID  . DULoxetine  60 mg Oral QHS  . enoxaparin (LOVENOX) injection  40 mg Subcutaneous Daily  . guaiFENesin-dextromethorphan  10 mL Oral Q6H  . hydrOXYzine  25 mg Oral TID  . ipratropium-albuterol  3 mL Nebulization Q6H  . mometasone-formoterol  2 puff Inhalation BID  . nicotine  21 mg Transdermal Daily  . sodium chloride flush  3 mL Intravenous Q12H  . sodium chloride flush  3 mL Intravenous Q12H   Continuous Infusions: . sodium chloride    . albuterol 10 mg/hr (04/13/18 0048)     LOS: 6 days    Domenic Polite, MD [facility] Page via [email or web address]

## 2018-04-16 NOTE — Progress Notes (Signed)
Physical Therapy Treatment Patient Details Name: Lisa Ryan MRN: [id number] DOB: 1975-11-29 Today's Date: 04/16/2018    History of Present Illness Pt is a 42 y/o female admitted secondary to increased dyspnea, cough and wheezing. Thought to be secondary to asthma exacerbation. PMH includes anxiety, asthma, and heart failure.     PT Comments    Patient progressing well with therapy today, ambulating further distances as well as stairs. Pt DOE 3/4 with activity, SpO2 WNL on 2L, HR 100-128. Staggers when ambulating at times requires UE support at times, and supervision for mobility.    Follow Up Recommendations  Supervision/Assistance - 24 hour     Equipment Recommendations  Other (comment)    Recommendations for Other Services OT consult     Precautions / Restrictions Precautions Precautions: Fall Precaution Comments: watch O2 Restrictions Weight Bearing Restrictions: No    Mobility  Bed Mobility Overal bed mobility: Modified Independent                Transfers Overall transfer level: Modified independent Equipment used: None Transfers: Sit to/from Stand              Ambulation/Gait Ambulation/Gait assistance: Min assist;Min guard Gait Distance (Feet): 100 Feet             Stairs Stairs: Yes Stairs assistance: Min guard;Supervision Stair Management: One rail Right;One rail Left;Alternating pattern Number of Stairs: 6 General stair comments: patient ambulating stairs with min guard, husband present capable of providing. no desatting  or LOB   Wheelchair Mobility    Modified Rankin (Stroke Patients Only)       Balance Overall balance assessment: Needs assistance Sitting-balance support: No upper extremity supported;Feet supported Sitting balance-Leahy Scale: Fair       Standing balance-Leahy Scale: Poor                              Cognition Arousal/Alertness: Awake/alert Behavior During Therapy: WFL for tasks  assessed/performed Overall Cognitive Status: Within Functional Limits for tasks assessed                                        Exercises      General Comments        Pertinent Vitals/Pain Pain Assessment: Faces Faces Pain Scale: Hurts a little bit Pain Location: L chest Pain Descriptors / Indicators: Grimacing;Guarding;Sharp Pain Intervention(s): Limited activity within patient's tolerance;Monitored during session    Home Living                      Prior Function            PT Goals (current goals can now be found in the care plan section) Acute Rehab PT Goals Patient Stated Goal: to feel better PT Goal Formulation: With patient Time For Goal Achievement: 04/28/18 Potential to Achieve Goals: Good Progress towards PT goals: Progressing toward goals    Frequency    Min 3X/week      PT Plan Current plan remains appropriate    Co-evaluation              AM-PAC PT "6 Clicks" Mobility   Outcome Measure  Help needed turning from your back to your side while in a flat bed without using bedrails?: None Help needed moving from lying on your back to sitting on the side  of a flat bed without using bedrails?: None Help needed moving to and from a bed to a chair (including a wheelchair)?: A Lot Help needed standing up from a chair using your arms (e.g., wheelchair or bedside chair)?: A Lot Help needed to walk in hospital room?: A Lot Help needed climbing 3-5 steps with a railing? : A Lot 6 Click Score: 16    End of Session Equipment Utilized During Treatment: Gait belt Activity Tolerance: Treatment limited secondary to medical complications (Comment) Patient left: in bed;with call bell/phone within reach;with family/visitor present Nurse Communication: Mobility status PT Visit Diagnosis: Other abnormalities of gait and mobility (R26.89);Muscle weakness (generalized) (M62.81);Difficulty in walking, not elsewhere classified (R26.2)      Time: 8250-0370 PT Time Calculation (min) (ACUTE ONLY): 16 min  Charges:  $Gait Training: 8-22 mins                    Reinaldo Berber, PT, DPT Acute Rehabilitation Services Pager: [phone] Office: [phone]     Reinaldo Berber 04/16/2018, 4:17 PM

## 2018-04-17 LAB — GLUCOSE, CAPILLARY: Glucose-Capillary: 73 mg/dL (ref 70–99)

## 2018-04-17 MED ORDER — PREDNISONE 20 MG PO TABS: 20.0000 mg | ORAL_TABLET | Freq: Every day | ORAL | 0 refills | Status: AC

## 2018-04-17 MED ORDER — ALPRAZOLAM 0.5 MG PO TABS: 0.5000 mg | ORAL_TABLET | Freq: Two times a day (BID) | ORAL | 0 refills | Status: DC | PRN

## 2018-04-17 MED ORDER — IPRATROPIUM-ALBUTEROL 0.5-2.5 (3) MG/3ML IN SOLN: 3.0000 mL | Freq: Four times a day (QID) | RESPIRATORY_TRACT | 0 refills | Status: AC | PRN

## 2018-04-17 MED ORDER — NICOTINE 14 MG/24HR TD PT24: 14.0000 mg | MEDICATED_PATCH | Freq: Every day | TRANSDERMAL | 0 refills | Status: DC

## 2018-04-17 MED ORDER — LORAZEPAM 2 MG/ML IJ SOLN
1.0000 mg | INTRAMUSCULAR | Status: DC | PRN
  Administered 2018-04-17: 1 mg via INTRAVENOUS
  Filled 2018-04-17: qty 1

## 2018-04-17 MED ORDER — DIPHENHYDRAMINE HCL 50 MG/ML IJ SOLN
INTRAMUSCULAR | Status: AC
  Filled 2018-04-17: qty 1

## 2018-04-17 MED ORDER — OMEPRAZOLE 20 MG PO CPDR: 20.0000 mg | DELAYED_RELEASE_CAPSULE | Freq: Every day | ORAL | 0 refills | Status: DC

## 2018-04-17 MED ORDER — DIPHENHYDRAMINE HCL 50 MG/ML IJ SOLN
12.5000 mg | Freq: Once | INTRAMUSCULAR | Status: AC
  Administered 2018-04-17: 12.5 mg via INTRAVENOUS
  Filled 2018-04-17: qty 1

## 2018-04-17 MED ORDER — VARENICLINE TARTRATE 0.5 MG PO TABS: 0.5000 mg | ORAL_TABLET | Freq: Two times a day (BID) | ORAL | 1 refills | Status: DC

## 2018-04-17 NOTE — Progress Notes (Signed)
SATURATION QUALIFICATIONS: (This note is used to comply with regulatory documentation for home oxygen)  Patient Saturations on Room Air at Rest = 97%  Patient Saturations on Room Air while Ambulating = %  Patient Saturations on 2 Liters of oxygen while Ambulating = 92%  Please briefly explain why patient needs home oxygen:

## 2018-04-24 ENCOUNTER — Inpatient Hospital Stay: Payer: Self-pay | Admitting: Family Medicine

## 2018-05-07 NOTE — Discharge Summary (Signed)
Physician Discharge Summary  Lisa Ryan WGN:[id number] DOB: 1975-10-20 DOA: 04/09/2018  PCP: Lisa Albee, MD  Admit date: 04/09/2018 Discharge date: 04/17/2018  Time spent: 35 minutes  Recommendations for Outpatient Follow-up:  1. PCP in 1 week 2. Outpatient Pulm in 1 month   Discharge Diagnoses:  Principal Problem:   Asthma with acute exacerbation Active Problems:   Anxiety   Shortness of breath   Acute respiratory failure with hypoxemia (HCC)   Hypoxemia   Tobacco abuse  Discharge Condition: stable  Diet recommendation: heart healthy  Filed Weights   04/09/18 1731  Weight: 88.2 kg    History of present illness:  43 year old female with history of anxiety, COPD, tobacco abuse presented to the ED with cough congestion shortness of breath wheezing subjective fevers and chills  Hospital Course:   1. COPD exacerbation/positive rhinovirus -required BiPAP on admission, now off -Respiratory virus panel was positive for rhinovirus, antibiotics discontinued -Very slow progress, anxiety also contributing, then intermittently required BiPAP overnight for 2 more night following coughing spells and secondary anxiety contributing  -Repeat chest x-ray with atelectasis only -Now finally improving, discharged home on prednisone taper, duo nebs, added Tessalon Perles yesterday  2. Anxiety.  -Continue PRN Xanax,, continue duloxetine.  3. Tobacco abuse -nicotine patch 21 mg, smoking cessationcounseled  Consultations:  Pulmonary  Discharge Exam: Vitals:   04/17/18 0850 04/17/18 0851  BP:    Pulse:    Resp:    Temp:    SpO2: 98% 98%    General: AAOx3 Cardiovascular: S1S2/RRR Respiratory: improved air movement  Discharge Instructions   Discharge Instructions    Diet - low sodium heart healthy   Complete by:  As directed    For home use only DME oxygen   Complete by:  As directed    Mode or (Route):  Nasal cannula   Liters per Minute:  2    Frequency:  Continuous (stationary and portable oxygen unit needed)   Oxygen conserving device:  Yes   Oxygen delivery system:  Gas   Increase activity slowly   Complete by:  As directed      Allergies as of 04/17/2018      Reactions   Albuterol Anxiety, Other (See Comments)   Increased HR 130s   Morphine Other (See Comments)   hallucinations      Medication List    STOP taking these medications   ciprofloxacin 500 MG tablet Commonly known as:  CIPRO   levalbuterol 0.63 MG/3ML nebulizer solution Commonly known as:  XOPENEX   metroNIDAZOLE 500 MG tablet Commonly known as:  FLAGYL     TAKE these medications   AEROCHAMBER MV inhaler Use as instructed   ALPRAZolam 0.5 MG tablet Commonly known as:  XANAX Take 1 tablet (0.5 mg total) by mouth 2 (two) times daily as needed for anxiety.   cholecalciferol 25 MCG (1000 UT) tablet Commonly known as:  VITAMIN D3 Take 5,000 Units by mouth at bedtime.   DULoxetine 60 MG capsule Commonly known as:  CYMBALTA Take 60 mg by mouth at bedtime. What changed:  Another medication with the same name was removed. Continue taking this medication, and follow the directions you see here.   fluticasone-salmeterol 115-21 MCG/ACT inhaler Commonly known as:  ADVAIR HFA Inhale 2 puffs into the lungs 2 (two) times daily. What changed:    when to take this  reasons to take this   ipratropium-albuterol 0.5-2.5 (3) MG/3ML Soln Commonly known as:  DUONEB Take 3 mLs by  nebulization every 6 (six) hours as needed.   naproxen 500 MG tablet Commonly known as:  NAPROSYN TAKE 1 TABLET BY MOUTH TWICE DAILY WITH A MEAL What changed:  See the new instructions.   nicotine 14 mg/24hr patch Commonly known as:  NICODERM CQ - dosed in mg/24 hours Place 1 patch (14 mg total) onto the skin daily.   omeprazole 20 MG capsule Commonly known as:  PRILOSEC Take 1 capsule (20 mg total) by mouth daily.   varenicline 0.5 MG tablet Commonly known as:   CHANTIX Take 1 tablet (0.5 mg total) by mouth 2 (two) times daily.     ASK your doctor about these medications   predniSONE 20 MG tablet Commonly known as:  DELTASONE Take 1 tablet (20 mg total) by mouth daily for 2 days. Ask about: Should I take this medication?            Durable Medical Equipment  (From admission, onward)         Start     Ordered   04/17/18 0000  For home use only DME oxygen    Question Answer Comment  Mode or (Route) Nasal cannula   Liters per Minute 2   Frequency Continuous (stationary and portable oxygen unit needed)   Oxygen conserving device Yes   Oxygen delivery system Gas      04/17/18 1055         Allergies  Allergen Reactions  . Albuterol Anxiety and Other (See Comments)    Increased HR 130s  . Morphine Other (See Comments)    hallucinations   Follow-up Information    Lisa Albee, MD. Schedule an appointment as soon as possible for a visit in 1 week(s).   Specialty:  Internal Medicine Contact information: [place] [address] [place] Follow up.   Why:  neb machine, home oxygen, rollator thru charity Contact information: [address] [place] [address] [phone]            The results of significant diagnostics from this hospitalization (including imaging, microbiology, ancillary and laboratory) are listed below for reference.    Significant Diagnostic Studies: Dg Chest 2 View  Result Date: 04/08/2018 CLINICAL DATA:  43 year old female with shortness of breath. EXAM: CHEST - 2 VIEW COMPARISON:  Chest radiograph dated 12/26/2015 FINDINGS: There is chronic bronchitic changes. No focal consolidation, pleural effusion, or pneumothorax. The cardiac silhouette is within normal limits. No acute osseous pathology. IMPRESSION: No active cardiopulmonary disease. Electronically Signed   By: Anner Crete M.D.   On: 04/08/2018 22:39   Ct Angio Chest  Pe W Or Wo Contrast  Result Date: 04/10/2018 CLINICAL DATA:  Acute respiratory failure after cruise. History of COPD. EXAM: CT ANGIOGRAPHY CHEST WITH CONTRAST TECHNIQUE: Multidetector CT imaging of the chest was performed using the standard protocol during bolus administration of intravenous contrast. Multiplanar CT image reconstructions and MIPs were obtained to evaluate the vascular anatomy. CONTRAST:  178mL ISOVUE-370 IOPAMIDOL (ISOVUE-370) INJECTION 76% COMPARISON:  Chest radiograph April 10, 2018. FINDINGS: CARDIOVASCULAR: Adequate contrast opacification of the pulmonary artery's. Main pulmonary artery is not enlarged. No pulmonary arterial filling defects to the level of the subsegmental branches. Heart size is normal, no right heart strain. No pericardial effusion. Thoracic aorta is normal course and caliber, unremarkable. MEDIASTINUM/NODES: No lymphadenopathy by CT size criteria. LUNGS/PLEURA: Tracheobronchial tree is patent, no pneumothorax. Mild bronchial wall thickening. Mild enhancing  bibasilar atelectasis. No pleural effusion or focal consolidation. Mild centrilobular emphysema. UPPER ABDOMEN: Non-acute.  Mildly elevated LEFT hemidiaphragm. MUSCULOSKELETAL: Non-acute. Asymmetrically atrophic RIGHT posterior neck muscles versus partial or early imaging artifact. Subcentimeter bone island C6. Review of the MIP images confirms the above findings. IMPRESSION: 1. No acute pulmonary embolism. 2. Mild bronchial wall thickening seen with bronchitis or reactive airway disease. Bibasilar atelectasis without pneumonia. Emphysema (ICD10-J43.9). Electronically Signed   By: Elon Alas M.D.   On: 04/10/2018 17:22   Dg Chest Port 1 View  Result Date: 04/16/2018 CLINICAL DATA:  Shortness of breath tonight. EXAM: PORTABLE CHEST 1 VIEW COMPARISON:  Radiograph yesterday. Chest CT 6 days ago 04/10/2018 FINDINGS: No significant change from prior exam. Unchanged heart size and mediastinal contours. Bibasilar  atelectasis without confluent airspace disease. Mild bronchial thickening is similar. No large pleural effusion or pneumothorax. Chronic mild elevation of left hemidiaphragm. IMPRESSION: Unchanged bronchial thickening and bibasilar atelectasis. Electronically Signed   By: Keith Rake M.D.   On: 04/16/2018 04:55   Dg Chest Port 1 View  Result Date: 04/15/2018 CLINICAL DATA:  Hypoxia. EXAM: PORTABLE CHEST 1 VIEW COMPARISON:  Radiograph and CT scan of April 10, 2018. FINDINGS: The heart size and mediastinal contours are within normal limits. No pneumothorax or pleural effusion is noted. Stable elevated left hemidiaphragm is noted. Minimal bibasilar subsegmental atelectasis is noted. The visualized skeletal structures are unremarkable. IMPRESSION: Minimal bibasilar subsegmental atelectasis. Electronically Signed   By: Marijo Conception, M.D.   On: 04/15/2018 10:33   Dg Chest Port 1 View  Result Date: 04/10/2018 CLINICAL DATA:  Dyspnea and shortness of breath today. Clinical suspicion of pneumonia. History of asthma. Current smoker. EXAM: PORTABLE CHEST 1 VIEW COMPARISON:  PA and lateral chest x-ray of April 08, 2018 FINDINGS: The right lung is adequately inflated. The lung markings are coarse at the right base today. On the left increased density at the right base is present. There's chronic mild elevation of the left hemidiaphragm. The heart and pulmonary vascularity are normal. The mediastinum is normal in width. IMPRESSION: Slight interval increase in the infrahilar lung markings bilaterally suggests subsegmental atelectasis. No discrete pneumonia. Electronically Signed   By: David  Martinique M.D.   On: 04/10/2018 08:45    Microbiology: No results found for this or any previous visit (from the past 240 hour(s)).   Labs: Basic Metabolic Panel: No results for input(s): NA, K, CL, CO2, GLUCOSE, BUN, CREATININE, CALCIUM, MG, PHOS in the last 168 hours. Liver Function Tests: No results for input(s):  AST, ALT, ALKPHOS, BILITOT, PROT, ALBUMIN in the last 168 hours. No results for input(s): LIPASE, AMYLASE in the last 168 hours. No results for input(s): AMMONIA in the last 168 hours. CBC: No results for input(s): WBC, NEUTROABS, HGB, HCT, MCV, PLT in the last 168 hours. Cardiac Enzymes: No results for input(s): CKTOTAL, CKMB, CKMBINDEX, TROPONINI in the last 168 hours. BNP: BNP (last 3 results) Recent Labs    04/10/18 0213  BNP 92.1    ProBNP (last 3 results) No results for input(s): PROBNP in the last 8760 hours.  CBG: No results for input(s): GLUCAP in the last 168 hours.     Signed:  Domenic Polite MD.  [facility] 05/07/2018, 4:14 PM

## 2018-07-03 ENCOUNTER — Emergency Department (HOSPITAL_COMMUNITY): Payer: Self-pay

## 2018-07-03 ENCOUNTER — Other Ambulatory Visit: Payer: Self-pay

## 2018-07-03 ENCOUNTER — Encounter (HOSPITAL_COMMUNITY): Payer: Self-pay | Admitting: *Deleted

## 2018-07-03 ENCOUNTER — Emergency Department (HOSPITAL_COMMUNITY)
Admission: EM | Admit: 2018-07-03 | Discharge: 2018-07-04 | Disposition: A | Discharge status: Home / Self Care | Payer: Self-pay | Attending: Emergency Medicine | Admitting: Emergency Medicine

## 2018-07-03 DIAGNOSIS — J449 Chronic obstructive pulmonary disease, unspecified: Secondary | ICD-10-CM | POA: Insufficient documentation

## 2018-07-03 DIAGNOSIS — R1032 Left lower quadrant pain: Secondary | ICD-10-CM | POA: Insufficient documentation

## 2018-07-03 DIAGNOSIS — Z79899 Other long term (current) drug therapy: Secondary | ICD-10-CM | POA: Insufficient documentation

## 2018-07-03 DIAGNOSIS — F17210 Nicotine dependence, cigarettes, uncomplicated: Secondary | ICD-10-CM | POA: Insufficient documentation

## 2018-07-03 LAB — CBC WITH DIFFERENTIAL/PLATELET
Abs Immature Granulocytes: 0.03 10*3/uL (ref 0.00–0.07)
Basophils Absolute: 0.1 10*3/uL (ref 0.0–0.1)
Basophils Relative: 1 %
Eosinophils Absolute: 0.4 10*3/uL (ref 0.0–0.5)
Eosinophils Relative: 3 %
HCT: 42.6 % (ref 36.0–46.0)
Hemoglobin: 14.7 g/dL (ref 12.0–15.0)
Immature Granulocytes: 0 %
LYMPHS ABS: 3 10*3/uL (ref 0.7–4.0)
Lymphocytes Relative: 28 %
MCH: 33.6 pg (ref 26.0–34.0)
MCHC: 34.5 g/dL (ref 30.0–36.0)
MCV: 97.5 fL (ref 80.0–100.0)
Monocytes Absolute: 0.8 10*3/uL (ref 0.1–1.0)
Monocytes Relative: 7 %
Neutro Abs: 6.5 10*3/uL (ref 1.7–7.7)
Neutrophils Relative %: 61 %
Platelets: 330 10*3/uL (ref 150–400)
RBC: 4.37 MIL/uL (ref 3.87–5.11)
RDW: 12 % (ref 11.5–15.5)
WBC: 10.7 10*3/uL — ABNORMAL HIGH (ref 4.0–10.5)
nRBC: 0 % (ref 0.0–0.2)

## 2018-07-03 LAB — COMPREHENSIVE METABOLIC PANEL
ALT: 36 U/L (ref 0–44)
AST: 27 U/L (ref 15–41)
Albumin: 3.8 g/dL (ref 3.5–5.0)
Alkaline Phosphatase: 71 U/L (ref 38–126)
Anion gap: 9 (ref 5–15)
BUN: 5 mg/dL — AB (ref 6–20)
CO2: 21 mmol/L — ABNORMAL LOW (ref 22–32)
Calcium: 8.8 mg/dL — ABNORMAL LOW (ref 8.9–10.3)
Chloride: 107 mmol/L (ref 98–111)
Creatinine, Ser: 0.74 mg/dL (ref 0.44–1.00)
GFR calc Af Amer: >60 mL/min (ref >60)
GFR calc non Af Amer: >60 mL/min (ref >60)
Glucose, Bld: 102 mg/dL — ABNORMAL HIGH (ref 70–99)
Potassium: 3.7 mmol/L (ref 3.5–5.1)
Sodium: 137 mmol/L (ref 135–145)
Total Bilirubin: 0.5 mg/dL (ref 0.3–1.2)
Total Protein: 6.3 g/dL — ABNORMAL LOW (ref 6.5–8.1)

## 2018-07-03 LAB — WET PREP, GENITAL
Clue Cells Wet Prep HPF POC: NONE SEEN
Sperm: NONE SEEN
Trich, Wet Prep: NONE SEEN
Yeast Wet Prep HPF POC: NONE SEEN

## 2018-07-03 LAB — URINALYSIS, ROUTINE W REFLEX MICROSCOPIC
Bilirubin Urine: NEGATIVE
Glucose, UA: NEGATIVE mg/dL
HGB URINE DIPSTICK: NEGATIVE
Ketones, ur: NEGATIVE mg/dL
Leukocytes,Ua: NEGATIVE
Nitrite: NEGATIVE
Protein, ur: NEGATIVE mg/dL
Specific Gravity, Urine: 1.019 (ref 1.005–1.030)
pH: 5 (ref 5.0–8.0)

## 2018-07-03 LAB — I-STAT BETA HCG BLOOD, ED (MC, WL, AP ONLY): I-stat hCG, quantitative: <5 m[IU]/mL (ref <5)

## 2018-07-03 LAB — LIPASE, BLOOD: Lipase: 27 U/L (ref 11–51)

## 2018-07-03 MED ORDER — IOHEXOL 300 MG/ML  SOLN
100.0000 mL | Freq: Once | INTRAMUSCULAR | Status: AC | PRN
  Administered 2018-07-03: 100 mL via INTRAVENOUS

## 2018-07-03 MED ORDER — ONDANSETRON HCL 4 MG/2ML IJ SOLN
4.0000 mg | Freq: Once | INTRAMUSCULAR | Status: AC
  Administered 2018-07-03: 4 mg via INTRAVENOUS
  Filled 2018-07-03: qty 2

## 2018-07-03 MED ORDER — AMOXICILLIN-POT CLAVULANATE 875-125 MG PO TABS: 1.0000 | ORAL_TABLET | Freq: Two times a day (BID) | ORAL | 0 refills | Status: DC

## 2018-07-03 MED ORDER — OXYCODONE-ACETAMINOPHEN 5-325 MG PO TABS: 2.0000 | ORAL_TABLET | ORAL | 0 refills | Status: DC | PRN

## 2018-07-03 MED ORDER — SODIUM CHLORIDE 0.9 % IV BOLUS
1000.0000 mL | Freq: Once | INTRAVENOUS | Status: AC
  Administered 2018-07-03: 1000 mL via INTRAVENOUS

## 2018-07-03 MED ORDER — PROMETHAZINE HCL 25 MG/ML IJ SOLN
25.0000 mg | Freq: Once | INTRAMUSCULAR | Status: AC
  Administered 2018-07-03: 25 mg via INTRAVENOUS
  Filled 2018-07-03: qty 1

## 2018-07-03 MED ORDER — OXYCODONE-ACETAMINOPHEN 5-325 MG PO TABS
2.0000 | ORAL_TABLET | Freq: Once | ORAL | Status: DC
  Filled 2018-07-03: qty 2

## 2018-07-03 MED ORDER — PROMETHAZINE HCL 25 MG PO TABS: 25.0000 mg | ORAL_TABLET | Freq: Four times a day (QID) | ORAL | 0 refills | Status: DC | PRN

## 2018-07-03 MED ORDER — KETOROLAC TROMETHAMINE 30 MG/ML IJ SOLN
30.0000 mg | Freq: Once | INTRAMUSCULAR | Status: AC
  Administered 2018-07-03: 30 mg via INTRAVENOUS
  Filled 2018-07-03: qty 1

## 2018-07-03 MED ORDER — AMOXICILLIN-POT CLAVULANATE 875-125 MG PO TABS
1.0000 | ORAL_TABLET | Freq: Once | ORAL | Status: AC
  Administered 2018-07-03: 1 via ORAL
  Filled 2018-07-03: qty 1

## 2018-07-03 MED ORDER — SODIUM CHLORIDE 0.9% FLUSH: 3.0000 mL | Freq: Once | INTRAVENOUS | Status: DC

## 2018-07-03 MED ORDER — HYDROMORPHONE HCL 1 MG/ML IJ SOLN
1.0000 mg | Freq: Once | INTRAMUSCULAR | Status: AC
  Administered 2018-07-03: 1 mg via INTRAMUSCULAR
  Filled 2018-07-03: qty 1

## 2018-07-03 NOTE — ED Notes (Signed)
Patient complaining of nausea at this time.  

## 2018-07-03 NOTE — ED Notes (Signed)
Patient verbalizes understanding of medications and discharge instructions. No further questions at this time. VSS and patient ambulatory at discharge.   

## 2018-07-03 NOTE — ED Provider Notes (Signed)
Signout from [employer].  43 year old female who awoke this morning with some vertiginous symptoms.  That lasted a few seconds and is resolved.  A little bit later she had acute onset of left lower quadrant pain that made her clammy.  She rates the pain is severe in onset.  She is never had this before.  She took some Aleve with no significant improvement.  No fevers or chills no nausea no vomiting no diarrhea no urinary symptoms no vaginal bleeding or discharge.  She is status post C-section, had a uterine ablation, cholecystectomy and appendectomy.  She said the pain is worse with movement and improves with being still. Physical Exam  BP 125/89 (BP Location: Right Arm)   Temp 97.7 F (36.5 C) (Oral)   Resp 18   Ht 5\' 7"  (1.702 m)   Wt 86.2 kg   SpO2 98%   BMI 29.76 kg/m   Physical Exam Vitals signs and nursing note reviewed.  Constitutional:      General: She is not in acute distress.    Appearance: She is well-developed.  HENT:     Head: Normocephalic and atraumatic.     Right Ear: Tympanic membrane normal.     Left Ear: Tympanic membrane normal.     Mouth/Throat:     Mouth: Mucous membranes are moist.     Pharynx: Oropharynx is clear. Uvula midline.  Eyes:     Conjunctiva/sclera: Conjunctivae normal.  Neck:     Musculoskeletal: Neck supple.  Cardiovascular:     Rate and Rhythm: Normal rate and regular rhythm.     Heart sounds: No murmur.  Pulmonary:     Effort: Pulmonary effort is normal. No respiratory distress.     Breath sounds: Normal breath sounds. No stridor. No wheezing.  Abdominal:     Palpations: Abdomen is soft.     Tenderness: There is abdominal tenderness in the left lower quadrant. There is no guarding or rebound.  Musculoskeletal: Normal range of motion.        General: No tenderness.  Skin:    General: Skin is warm and dry.     Capillary Refill: Capillary refill takes less than 2 seconds.  Neurological:     General: No focal deficit present.     Mental  Status: She is alert and oriented to person, place, and time.     GCS: GCS eye subscore is 4. GCS verbal subscore is 5. GCS motor subscore is 6.   Pelvic exam done with chaperone.  Normal external genitalia.  Speculum exam minimal thin white discharge in vault.  No CMT no right adnexal tenderness.  Moderate amount of left adnexal tenderness although her main pain seems to be a little superior to that.  ED Course/Procedures   Clinical Course as of Jul 03 1736  Mon Jul 03, 2018  1942 CT unremarkable for source of her pain.  Ultrasound shows some ovarian cyst but no torsion.   [MB]  2235 Patient continuing to have significant amount of pain.  She is also feeling nauseous with it is unclear is from pain or from the pain medications.  Tried some Toradol and Phenergan without real significant relief.  She is still tender in her right lower quadrant so we will do a pelvic exam although in the setting of a normal ultrasound sure that it can had a whole lot to it but will do.   [MB]  2308 Discussed with GYN on-call.  They felt that with normal flow and  cysts less than 3 cm and no bleeding on exam, there is no indication for emergent evaluation she can follow-up in the clinic.   [MB]    Clinical Course User Index [MB] Hayden Rasmussen, MD    Procedures  MDM         Hayden Rasmussen, MD 07/04/18 [phone]

## 2018-07-03 NOTE — Discharge Instructions (Addendum)
You were seen in the emergency department for left lower quadrant pain.  Your testing did show some ovarian cysts but otherwise no obvious explanation for your pain.  We are treating with antibiotics for possible early diverticulitis and also given you prescription for pain and nausea medicine.  Please make follow-up with your primary care doctor and your gynecologist or return to the emergency department if any worsening symptoms.

## 2018-07-03 NOTE — ED Notes (Signed)
Patient transported to CT 

## 2018-07-03 NOTE — ED Provider Notes (Signed)
[place] EMERGENCY DEPARTMENT Provider Note   CSN: [id number] Arrival date & time: 07/03/18  1406    History   Chief Complaint Chief Complaint  Patient presents with  . Abdominal Pain    HPI Lisa Ryan is a 43 y.o. female who presents with left lower quadrant pain.  Past medical history significant for CHF during pregnancy, COPD, smoking, seizure disorder, peptic ulcer disease.  She states that she woke up this morning had acute onset of dizziness and left lower quadrant pain.  The pain is constant and nonradiating.  Nothing makes it better or worse.  She has never had this pain before.  She took 3 Aleve and this did not help her pain so she decided to come to the emergency department.  She denies fever, chest pain, shortness of breath, back or flank pain, nausea, vomiting, diarrhea, constipation, urinary symptoms, vaginal discharge or bleeding.  No current dizziness. She has not had a period in several years after a uterine ablation for heavy periods.  She is sexually active with her husband.  She recently was diagnosed with the flu couple weeks ago and was in the hospital in December for acute respiratory failure.  Past surgical history significant for cholecystectomy, appendectomy, C-section.  She has had colonoscopies in the past because of family history of colon cancer.   HPI  Past Medical History:  Diagnosis Date  . Anemia   . Anxiety state, unspecified 10/12/2007  . CHEST PAIN, ATYPICAL 02/18/2010  . CHEST PAIN-PRECORDIAL 02/19/2010  . CHF (congestive heart failure) ([place])    During Pregnancy  . COPD (chronic obstructive pulmonary disease) ([place])   . FATIGUE 09/28/2007  . GASTROENTERITIS 10/03/2008  . GERD (gastroesophageal reflux disease)   . MIGRAINE HEADACHE 03/04/2010  . PEDAL EDEMA 02/18/2010  . PEPTIC ULCER DISEASE 09/28/2007  . 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 PONV (postoperative nausea and vomiting)   . SEIZURE  DISORDER 09/28/2007  . Thyroid nodule   . Tobacco abuse   . WEIGHT GAIN 09/28/2007    Patient Active Problem List   Diagnosis Date Noted  . Hypoxemia   . Shortness of breath   . Acute respiratory failure with hypoxemia ([place])   . Asthma with acute exacerbation 04/09/2018  . Tobacco abuse   . Abnormal uterine bleeding (AUB) on  12/08/2015  . 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 Cough 10/08/2013  . Bronchitis 10/05/2013  . Reactive airway disease 10/05/2013  . Former heavy tobacco smoker 10/05/2013  . Unspecified disorder of thyroid 04/18/2012  . Flushing 03/07/2012  . Atypical chest pain 03/07/2012  . Gastritis 07/06/2011  . Family hx of colon cancer 07/06/2011  . MIGRAINE HEADACHE 03/04/2010  . PEDAL EDEMA 02/18/2010  . Anxiety 10/12/2007  . PEPTIC ULCER DISEASE 09/28/2007  . SEIZURE DISORDER 09/28/2007  . WEIGHT GAIN 09/28/2007    Past Surgical History:  Procedure Laterality Date  . ANKLE SURGERY     left  . APPENDECTOMY    . CESAREAN SECTION     x3  . CHOLECYSTECTOMY    . DILITATION & CURRETTAGE/HYSTROSCOPY WITH NOVASURE ABLATION N/A 12/26/2015   Procedure: DILATATION & CURETTAGE/HYSTEROSCOPY WITH ENDOMETRIAL ABLATION;  Surgeon: Jonnie Kind, MD;  Location: [facility];  Service: Gynecology;  Laterality: N/A;  . KNEE ARTHROSCOPY     left  . MOUTH LESION EXCISIONAL BIOPSY  04/2011   pre-cancerous, pallet of mouth     OB History   No obstetric history  on file.      Home Medications    Prior to Admission medications   Medication Sig Start Date End Date Taking? Authorizing Provider  ALPRAZolam Duanne Moron) 0.5 MG tablet Take 1 tablet (0.5 mg total) by mouth 2 (two) times daily as needed for anxiety. 04/17/18   Domenic Polite, MD  cholecalciferol (VITAMIN D3) 25 MCG (1000 UT) tablet Take 5,000 Units by mouth at bedtime.    [provider]  DULoxetine (CYMBALTA) 60 MG capsule Take 60 mg by mouth at bedtime.    [provider]    fluticasone-salmeterol (ADVAIR HFA) 115-21 MCG/ACT inhaler Inhale 2 puffs into the lungs 2 (two) times daily. Patient taking differently: Inhale 2 puffs into the lungs daily as needed (SOB).  10/30/13   Juanito Doom, MD  ipratropium-albuterol (DUONEB) 0.5-2.5 (3) MG/3ML SOLN Take 3 mLs by nebulization every 6 (six) hours as needed. 04/17/18   Domenic Polite, MD  naproxen (NAPROSYN) 500 MG tablet TAKE 1 TABLET BY MOUTH TWICE DAILY WITH A MEAL Patient taking differently: Take 500 mg by mouth 2 (two) times daily as needed for mild pain.  06/13/16   Carole Civil, MD  nicotine (NICODERM CQ - DOSED IN MG/24 HOURS) 14 mg/24hr patch Place 1 patch (14 mg total) onto the skin daily. 04/18/18   Domenic Polite, MD  omeprazole (PRILOSEC) 20 MG capsule Take 1 capsule (20 mg total) by mouth daily. 04/17/18   Domenic Polite, MD  Spacer/Aero-Holding Chambers (AEROCHAMBER MV) inhaler Use as instructed 10/30/13   Juanito Doom, MD  varenicline (CHANTIX) 0.5 MG tablet Take 1 tablet (0.5 mg total) by mouth 2 (two) times daily. 04/17/18   Domenic Polite, MD    Family History Family History  Problem Relation Age of Onset  . Cerebral aneurysm Mother   . Colon cancer Mother   . Heart disease Father   . Liver disease Father   . Diabetes Brother 1       died age 33  . Kidney disease Maternal Grandmother   . Colon cancer Maternal Grandmother   . Colon cancer Maternal Aunt   . Diabetes Sister     Social History Social History   Tobacco Use  . Smoking status: Current Every Day Smoker    Packs/day: 1.00    Years: 20.00    Pack years: 20.00    Types: Cigarettes  . Smokeless tobacco: Never Used  . Tobacco comment: Gave pt sheet to quit smoking  Substance Use Topics  . Alcohol use: Yes  . Drug use: No     Allergies   Albuterol and Morphine   Review of Systems Review of Systems  Constitutional: Positive for chills and diaphoresis. Negative for fever.  Respiratory: Negative for  shortness of breath.   Cardiovascular: Negative for chest pain.  Gastrointestinal: Positive for abdominal pain. Negative for constipation, diarrhea, nausea and vomiting.  Genitourinary: Negative for dyspareunia, dysuria, flank pain, hematuria, pelvic pain, vaginal bleeding and vaginal discharge.  All other systems reviewed and are negative.    Physical Exam Updated Vital Signs BP 125/89 (BP Location: Right Arm)   Temp 97.7 F (36.5 C) (Oral)   Resp 18   Ht 5\' 7"  (1.702 m)   Wt 86.2 kg   SpO2 98%   BMI 29.76 kg/m   Physical Exam Vitals signs and nursing note reviewed.  Constitutional:      General: She is not in acute distress.    Appearance: She is well-developed.     Comments:  Calm and cooperative  HENT:     Head: Normocephalic and atraumatic.  Eyes:     General: No scleral icterus.       Right eye: No discharge.        Left eye: No discharge.     Conjunctiva/sclera: Conjunctivae normal.     Pupils: Pupils are equal, round, and reactive to light.  Neck:     Musculoskeletal: Normal range of motion.  Cardiovascular:     Rate and Rhythm: Normal rate and regular rhythm.  Pulmonary:     Effort: Pulmonary effort is normal. No respiratory distress.     Breath sounds: Normal breath sounds.  Abdominal:     General: There is no distension.     Palpations: Abdomen is soft.     Tenderness: There is abdominal tenderness (Left lower quadrant tenderness). There is left CVA tenderness.  Skin:    General: Skin is warm and dry.  Neurological:     Mental Status: She is alert and oriented to person, place, and time.  Psychiatric:        Behavior: Behavior normal.      ED Treatments / Results  Labs (all labs ordered are listed, but only abnormal results are displayed) Labs Reviewed  CBC WITH DIFFERENTIAL/PLATELET - Abnormal; Notable for the following components:      Result Value   WBC 10.7 (*)    All other components within normal limits  LIPASE, BLOOD  COMPREHENSIVE  METABOLIC PANEL  URINALYSIS, ROUTINE W REFLEX MICROSCOPIC  I-STAT BETA HCG BLOOD, ED ([facility], [facility], [facility] ONLY)  I-STAT BETA HCG BLOOD, ED ([facility], [facility], [facility] ONLY)    EKG None  Radiology No results found.  Procedures Procedures (including critical care time)  Medications Ordered in ED Medications  sodium chloride flush (NS) 0.9 % injection 3 mL (has no administration in time range)  HYDROmorphone (DILAUDID) injection 1 mg (has no administration in time range)     Initial Impression / Assessment and Plan / ED Course  I have reviewed the triage vital signs and the nursing notes.  Pertinent labs & imaging results that were available during my care of the patient were reviewed by me and considered in my medical decision making (see chart for details).  43 year old female presents with left lower quadrant abdominal pain for 1 day.  It is acute in onset.  She has not had anything similar in the past.  Her vital signs are normal here.  Heart is regular rate and rhythm.  Lungs are clear to auscultation.  She has left CVA tenderness and left lower quadrant tenderness.  Differential includes diverticulitis, nephrolithiasis, ovarian cyst or torsion.  The patient was initially placed in a lower acuity pod and was moved to a higher acuity pad after my evaluation.  Pain medicine was ordered along with labs and CT renal.  Care signed out to Dr. Melina Copa who will follow results.  Final Clinical Impressions(s) / ED Diagnoses   Final diagnoses:  Left lower quadrant abdominal pain    ED Discharge Orders    None       Recardo Evangelist, PA-C 07/03/18 1452    Carmin Muskrat, MD 07/04/18 [phone]

## 2018-07-03 NOTE — ED Triage Notes (Signed)
C/o left lower quad. Onset  This am after waking, denies n/v.

## 2018-07-03 NOTE — ED Notes (Addendum)
Patient unable to tolerate PO fluids at this time.  

## 2018-07-04 LAB — GC/CHLAMYDIA PROBE AMP (~~LOC~~) NOT AT ARMC
Chlamydia: NEGATIVE
Neisseria Gonorrhea: NEGATIVE

## 2018-10-04 ENCOUNTER — Other Ambulatory Visit: Payer: Self-pay | Admitting: Internal Medicine

## 2018-10-04 ENCOUNTER — Other Ambulatory Visit (HOSPITAL_COMMUNITY): Payer: Self-pay | Admitting: Internal Medicine

## 2018-10-04 DIAGNOSIS — E041 Nontoxic single thyroid nodule: Secondary | ICD-10-CM

## 2018-10-06 ENCOUNTER — Ambulatory Visit (HOSPITAL_COMMUNITY)
Admission: RE | Admit: 2018-10-06 | Discharge: 2018-10-06 | Disposition: A | Discharge status: Home / Self Care | Payer: Self-pay | Source: Ambulatory Visit | Attending: Internal Medicine | Admitting: Internal Medicine

## 2018-10-06 ENCOUNTER — Other Ambulatory Visit: Payer: Self-pay

## 2018-10-06 DIAGNOSIS — E041 Nontoxic single thyroid nodule: Secondary | ICD-10-CM | POA: Insufficient documentation

## 2018-12-25 ENCOUNTER — Other Ambulatory Visit: Payer: Self-pay

## 2018-12-25 ENCOUNTER — Emergency Department (HOSPITAL_COMMUNITY): Payer: Self-pay

## 2018-12-25 ENCOUNTER — Emergency Department (HOSPITAL_COMMUNITY)
Admission: EM | Admit: 2018-12-25 | Discharge: 2018-12-26 | Disposition: A | Discharge status: Home / Self Care | Payer: Self-pay | Attending: Emergency Medicine | Admitting: Emergency Medicine

## 2018-12-25 DIAGNOSIS — F17210 Nicotine dependence, cigarettes, uncomplicated: Secondary | ICD-10-CM | POA: Insufficient documentation

## 2018-12-25 DIAGNOSIS — R0602 Shortness of breath: Secondary | ICD-10-CM | POA: Insufficient documentation

## 2018-12-25 DIAGNOSIS — R5383 Other fatigue: Secondary | ICD-10-CM | POA: Insufficient documentation

## 2018-12-25 DIAGNOSIS — J449 Chronic obstructive pulmonary disease, unspecified: Secondary | ICD-10-CM | POA: Insufficient documentation

## 2018-12-25 DIAGNOSIS — Z79899 Other long term (current) drug therapy: Secondary | ICD-10-CM | POA: Insufficient documentation

## 2018-12-25 DIAGNOSIS — R072 Precordial pain: Secondary | ICD-10-CM | POA: Insufficient documentation

## 2018-12-25 LAB — BASIC METABOLIC PANEL
Anion gap: 10 (ref 5–15)
BUN: 7 mg/dL (ref 6–20)
CO2: 21 mmol/L — ABNORMAL LOW (ref 22–32)
Calcium: 8.7 mg/dL — ABNORMAL LOW (ref 8.9–10.3)
Chloride: 105 mmol/L (ref 98–111)
Creatinine, Ser: 0.64 mg/dL (ref 0.44–1.00)
GFR calc Af Amer: >60 mL/min (ref >60)
GFR calc non Af Amer: >60 mL/min (ref >60)
Glucose, Bld: 102 mg/dL — ABNORMAL HIGH (ref 70–99)
Potassium: 3.6 mmol/L (ref 3.5–5.1)
Sodium: 136 mmol/L (ref 135–145)

## 2018-12-25 LAB — CBC
HCT: 43.7 % (ref 36.0–46.0)
Hemoglobin: 14.4 g/dL (ref 12.0–15.0)
MCH: 33.5 pg (ref 26.0–34.0)
MCHC: 33 g/dL (ref 30.0–36.0)
MCV: 101.6 fL — ABNORMAL HIGH (ref 80.0–100.0)
Platelets: 310 10*3/uL (ref 150–400)
RBC: 4.3 MIL/uL (ref 3.87–5.11)
RDW: 12.2 % (ref 11.5–15.5)
WBC: 8.6 10*3/uL (ref 4.0–10.5)
nRBC: 0 % (ref 0.0–0.2)

## 2018-12-25 LAB — I-STAT BETA HCG BLOOD, ED (MC, WL, AP ONLY): I-stat hCG, quantitative: <5 m[IU]/mL (ref <5)

## 2018-12-25 LAB — TROPONIN I (HIGH SENSITIVITY): Troponin I (High Sensitivity): 2 ng/L (ref <18)

## 2018-12-25 MED ORDER — NITROGLYCERIN 0.4 MG SL SUBL
0.4000 mg | SUBLINGUAL_TABLET | SUBLINGUAL | Status: DC | PRN
  Administered 2018-12-25 – 2018-12-26 (×2): 0.4 mg via SUBLINGUAL
  Filled 2018-12-25: qty 1

## 2018-12-25 MED ORDER — ASPIRIN 81 MG PO CHEW
324.0000 mg | CHEWABLE_TABLET | Freq: Once | ORAL | Status: AC
  Administered 2018-12-25: 324 mg via ORAL
  Filled 2018-12-25: qty 4

## 2018-12-25 MED ORDER — SODIUM CHLORIDE 0.9% FLUSH: 3.0000 mL | Freq: Once | INTRAVENOUS | Status: DC

## 2018-12-25 NOTE — ED Triage Notes (Signed)
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 Pt endorses abdominal pain this morning, got nauseas, then chest started hurting. Pt sts pain is tight/squeezing. Pt endorses nausea, dizziness, and shob and radiation of pain and tingling to R arm. Endorses bilateral blurry vision. Hx CHF, mild swelling to bilateral ankles.

## 2018-12-25 NOTE — ED Provider Notes (Signed)
[place] EMERGENCY DEPARTMENT Provider Note   CSN: [id number] Arrival date & time: 12/25/18  1511     History   Chief Complaint Chief Complaint  Patient presents with  . Chest Pain    HPI Lisa Ryan is a 43 y.o. female.  HPI: A 43 year old patient with a history of obesity presents for evaluation of chest pain. Initial onset of pain was more than 6 hours ago. The patient's chest pain is described as heaviness/pressure/tightness, is not worse with exertion and is relieved by nitroglycerin. The patient's chest pain is middle- or left-sided, is not well-localized, is not sharp and does radiate to the arms/jaw/neck. The patient does not complain of nausea and denies diaphoresis. The patient has smoked in the past 90 days and has a family history of coronary artery disease in a first-degree relative with onset less than age 46. The patient has no history of stroke, has no history of peripheral artery disease, denies any history of treated diabetes, is not hypertensive and has no history of hypercholesterolemia.   HPI Patient presents with chest pain She reports she woke up with abdominal pain, noted bruising to her right chest.  She reports chest pressure that was her right arm.  It then moved to the left side of her chest and her left arm.  It is ongoing at this time.  She has had this pain nearly all day.  No known history of CAD.  She reports previous history of CHF She is a smoker She has not had this patient recently She reports  Recent lower extremity edema  She reports her abdominal pain has resolved Past Medical History:  Diagnosis Date  . Anemia   . Anxiety state, unspecified 10/12/2007  . CHEST PAIN, ATYPICAL 02/18/2010  . CHEST PAIN-PRECORDIAL 02/19/2010  . CHF (congestive heart failure) ([place])    During Pregnancy  . COPD (chronic obstructive pulmonary disease) ([place])   . FATIGUE 09/28/2007  . GASTROENTERITIS 10/03/2008  . GERD (gastroesophageal  reflux disease)   . MIGRAINE HEADACHE 03/04/2010  . PEDAL EDEMA 02/18/2010  . PEPTIC ULCER DISEASE 09/28/2007  . 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 PONV (postoperative nausea and vomiting)   . SEIZURE DISORDER 09/28/2007  . Thyroid nodule   . Tobacco abuse   . WEIGHT GAIN 09/28/2007    Patient Active Problem List   Diagnosis Date Noted  . Hypoxemia   . Shortness of breath   . Acute respiratory failure with hypoxemia ([place])   . Asthma with acute exacerbation 04/09/2018  . Tobacco abuse   . Abnormal uterine bleeding (AUB) on  12/08/2015  . 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 Cough 10/08/2013  . Bronchitis 10/05/2013  . Reactive airway disease 10/05/2013  . Former heavy tobacco smoker 10/05/2013  . Unspecified disorder of thyroid 04/18/2012  . Flushing 03/07/2012  . Atypical chest pain 03/07/2012  . Gastritis 07/06/2011  . Family hx of colon cancer 07/06/2011  . MIGRAINE HEADACHE 03/04/2010  . PEDAL EDEMA 02/18/2010  . Anxiety 10/12/2007  . PEPTIC ULCER DISEASE 09/28/2007  . SEIZURE DISORDER 09/28/2007  . WEIGHT GAIN 09/28/2007    Past Surgical History:  Procedure Laterality Date  . ANKLE SURGERY     left  . APPENDECTOMY    . CESAREAN SECTION     x3  . CHOLECYSTECTOMY    . DILITATION & CURRETTAGE/HYSTROSCOPY WITH NOVASURE ABLATION N/A 12/26/2015   Procedure: DILATATION & CURETTAGE/HYSTEROSCOPY WITH ENDOMETRIAL ABLATION;  Surgeon:  Jonnie Kind, MD;  Location: [facility];  Service: Gynecology;  Laterality: N/A;  . KNEE ARTHROSCOPY     left  . MOUTH LESION EXCISIONAL BIOPSY  04/2011   pre-cancerous, pallet of mouth     OB History   No obstetric history on file.      Home Medications    Prior to Admission medications   Medication Sig Start Date End Date Taking? Authorizing Provider  DULoxetine (CYMBALTA) 30 MG capsule Take 60 mg by mouth at bedtime.    Yes [provider]  levalbuterol (XOPENEX HFA) 45  MCG/ACT inhaler Inhale 1-2 puffs into the lungs daily as needed for wheezing or shortness of breath.   Yes [provider]  naproxen sodium (ALEVE) 220 MG tablet Take 660 mg by mouth daily as needed (pain).   Yes [provider]  progesterone (PROMETRIUM) 200 MG capsule Take 400 mg by mouth at bedtime.    Yes [provider]  ALPRAZolam (XANAX) 0.5 MG tablet Take 1 tablet (0.5 mg total) by mouth 2 (two) times daily as needed for anxiety. Patient not taking: Reported on 07/03/2018 04/17/18   Domenic Polite, MD  fluticasone-salmeterol (ADVAIR HFA) [phone] MCG/ACT inhaler Inhale 2 puffs into the lungs 2 (two) times daily. Patient not taking: Reported on 07/03/2018 10/30/13   Juanito Doom, MD  ipratropium-albuterol (DUONEB) 0.5-2.5 (3) MG/3ML SOLN Take 3 mLs by nebulization every 6 (six) hours as needed. Patient not taking: Reported on 12/26/2018 04/17/18   Domenic Polite, MD  nicotine (NICODERM CQ - DOSED IN MG/24 HOURS) 14 mg/24hr patch Place 1 patch (14 mg total) onto the skin daily. Patient not taking: Reported on 07/03/2018 04/18/18   Domenic Polite, MD  nitroGLYCERIN (NITROSTAT) 0.4 MG SL tablet Place 1 tablet (0.4 mg total) under the tongue every 5 (five) minutes as needed for chest pain. 12/26/18   Ripley Fraise, MD  omeprazole (PRILOSEC) 20 MG capsule Take 1 capsule (20 mg total) by mouth daily. Patient not taking: Reported on 07/03/2018 04/17/18   Domenic Polite, MD  oxyCODONE-acetaminophen (PERCOCET/ROXICET) 5-325 MG tablet Take 2 tablets by mouth every 4 (four) hours as needed for severe pain. Patient not taking: Reported on 12/26/2018 07/03/18   Hayden Rasmussen, MD  promethazine (PHENERGAN) 25 MG tablet Take 1 tablet (25 mg total) by mouth every 6 (six) hours as needed for nausea or vomiting. Patient not taking: Reported on 12/26/2018 07/03/18   Hayden Rasmussen, MD  Spacer/Aero-Holding Chambers (AEROCHAMBER MV) inhaler Use as instructed 10/30/13   Juanito Doom, MD  varenicline (CHANTIX) 0.5 MG tablet Take 1 tablet (0.5 mg total) by mouth 2 (two) times daily. Patient not taking: Reported on 07/03/2018 04/17/18   Domenic Polite, MD    Family History Family History  Problem Relation Age of Onset  . Cerebral aneurysm Mother   . Colon cancer Mother   . Heart disease Father   . Liver disease Father   . Diabetes Brother 1       died age 62  . Kidney disease Maternal Grandmother   . Colon cancer Maternal Grandmother   . Colon cancer Maternal Aunt   . Diabetes Sister     Social History Social History   Tobacco Use  . Smoking status: Current Every Day Smoker    Packs/day: 1.00    Years: 20.00    Pack years: 20.00    Types: Cigarettes  . Smokeless tobacco: Never Used  . Tobacco comment: Gave pt sheet to  quit smoking  Substance Use Topics  . Alcohol use: Yes  . Drug use: No     Allergies   Albuterol and Morphine   Review of Systems Review of Systems  Constitutional: Positive for fatigue. Negative for fever.  Respiratory: Positive for shortness of breath.   Cardiovascular: Positive for chest pain.  Psychiatric/Behavioral: The patient is nervous/anxious.   All other systems reviewed and are negative.    Physical Exam Updated Vital Signs BP 102/72   Pulse 74   Temp 98.6 F (37 C) (Oral)   Resp 20   Ht 1.689 m (5' 6.5")   Wt 83.9 kg   SpO2 96%   BMI 29.41 kg/m   Physical Exam CONSTITUTIONAL: Well developed/well nourished HEAD: Normocephalic/atraumatic EYES: EOMI ENMT: Mucous membranes moist NECK: supple no meningeal signs SPINE/BACK:entire spine nontender CV: S1/S2 noted, no murmurs/rubs/gallops noted LUNGS: Scattered wheezing bilaterally, no apparent distress ABDOMEN: soft, nontender, no rebound or guarding, bowel sounds noted throughout abdomen GU:no cva tenderness NEURO: Pt is awake/alert/appropriate, moves all extremitiesx4.  No facial droop.   EXTREMITIES: pulses normal/equal, full ROM, no lower extremity  edema or tenderness SKIN: warm, color normal PSYCH: Mildly anxious   ED Treatments / Results  Labs (all labs ordered are listed, but only abnormal results are displayed) Labs Reviewed  BASIC METABOLIC PANEL - Abnormal; Notable for the following components:      Result Value   CO2 21 (*)    Glucose, Bld 102 (*)    Calcium 8.7 (*)    All other components within normal limits  CBC - Abnormal; Notable for the following components:   MCV 101.6 (*)    All other components within normal limits  I-STAT BETA HCG BLOOD, ED ([facility], [facility], [facility] ONLY)  TROPONIN I (HIGH SENSITIVITY)  TROPONIN I (HIGH SENSITIVITY)    EKG EKG Interpretation  Date/Time:  Tuesday December 26 2018 00:15:52 EDT Ventricular Rate:  72 PR Interval:  128 QRS Duration: 80 QT Interval:  398 QTC Calculation: 436 R Axis:   76 Text Interpretation:  Sinus rhythm No significant change since last tracing Confirmed by Ripley Fraise [phone]) on 12/26/2018 12:20:58 AM   Radiology Dg Chest 2 View  Result Date: 12/25/2018 CLINICAL DATA:  Chest pain. EXAM: CHEST - 2 VIEW COMPARISON:  Chest x-ray dated April 16, 2018. FINDINGS: The heart size and mediastinal contours are within normal limits. Normal pulmonary vascularity. Unchanged chronic mild peribronchial thickening. No focal consolidation, pleural effusion, or pneumothorax. No acute osseous abnormality. IMPRESSION: No active cardiopulmonary disease. Electronically Signed   By: Titus Dubin M.D.   On: 12/25/2018 16:38    Procedures Procedures  Medications Ordered in ED Medications  sodium chloride flush (NS) 0.9 % injection 3 mL (3 mLs Intravenous Not Given 12/26/18 0018)  nitroGLYCERIN (NITROSTAT) SL tablet 0.4 mg (0.4 mg Sublingual Given 12/26/18 0018)  aspirin chewable tablet 324 mg (324 mg Oral Given 12/25/18 2356)     Initial Impression / Assessment and Plan / ED Course  I have reviewed the triage vital signs and the nursing notes.  Pertinent labs & imaging results  that were available during my care of the patient were reviewed by me and considered in my medical decision making (see chart for details).     HEAR Score: 4  Patient presented for chest pain throughout the day.  It actually began with abdominal pain that then settled into her chest for several hours. 2 EKGs showed no acute changes.  Repeat troponin was less than  2. Hear score was 4 due to relief of pain from nitroglycerin.  Offered patient admission or close follow-up with PCP. Patient is feeling improved and prefers to be discharged. I did prescribe her nitroglycerin after further discussion. Patient is awake alert in no acute distress.  She is nontoxic in appearance. Reassured that she has good PCP follow-up.  We discussed strict ER return precautions. Patient is agreeable with plan Low suspicion for PE or other acute cause of CP  Final Clinical Impressions(s) / ED Diagnoses   Final diagnoses:  Precordial pain    ED Discharge Orders         Ordered    nitroGLYCERIN (NITROSTAT) 0.4 MG SL tablet  Every 5 min PRN     12/26/18 0050           Ripley Fraise, MD 12/26/18 0117

## 2018-12-26 LAB — TROPONIN I (HIGH SENSITIVITY): Troponin I (High Sensitivity): <2 ng/L (ref <18)

## 2018-12-26 MED ORDER — NITROGLYCERIN 0.4 MG SL SUBL: 0.4000 mg | SUBLINGUAL_TABLET | SUBLINGUAL | 0 refills | Status: DC | PRN

## 2018-12-26 NOTE — Discharge Instructions (Addendum)

## 2018-12-26 NOTE — ED Notes (Signed)
Pt verbalized understanding of discharge instructions and followup needs

## 2019-01-18 ENCOUNTER — Other Ambulatory Visit (INDEPENDENT_AMBULATORY_CARE_PROVIDER_SITE_OTHER): Payer: Self-pay | Admitting: Internal Medicine

## 2019-01-18 MED ORDER — PROGESTERONE MICRONIZED 200 MG PO CAPS: 400.0000 mg | ORAL_CAPSULE | Freq: Every day | ORAL | 3 refills | Status: DC

## 2019-01-22 ENCOUNTER — Other Ambulatory Visit (INDEPENDENT_AMBULATORY_CARE_PROVIDER_SITE_OTHER): Payer: Self-pay | Admitting: Internal Medicine

## 2019-01-22 MED ORDER — PROGESTERONE MICRONIZED 200 MG PO CAPS: 400.0000 mg | ORAL_CAPSULE | Freq: Every day | ORAL | 3 refills | Status: DC

## 2019-02-13 ENCOUNTER — Other Ambulatory Visit (INDEPENDENT_AMBULATORY_CARE_PROVIDER_SITE_OTHER): Payer: Self-pay | Admitting: Internal Medicine

## 2019-02-13 MED ORDER — PREDNISONE 20 MG PO TABS: 40.0000 mg | ORAL_TABLET | Freq: Every day | ORAL | 1 refills | Status: DC

## 2019-03-13 ENCOUNTER — Other Ambulatory Visit (INDEPENDENT_AMBULATORY_CARE_PROVIDER_SITE_OTHER): Payer: Self-pay | Admitting: Internal Medicine

## 2019-03-13 MED ORDER — DULOXETINE HCL 30 MG PO CPEP: 90.0000 mg | ORAL_CAPSULE | Freq: Every day | ORAL | 2 refills | Status: DC

## 2019-07-09 ENCOUNTER — Other Ambulatory Visit (INDEPENDENT_AMBULATORY_CARE_PROVIDER_SITE_OTHER): Payer: Self-pay | Admitting: Internal Medicine

## 2019-07-11 ENCOUNTER — Other Ambulatory Visit (INDEPENDENT_AMBULATORY_CARE_PROVIDER_SITE_OTHER): Payer: Self-pay | Admitting: Internal Medicine

## 2019-07-11 DIAGNOSIS — M25511 Pain in right shoulder: Secondary | ICD-10-CM

## 2019-07-12 ENCOUNTER — Encounter (INDEPENDENT_AMBULATORY_CARE_PROVIDER_SITE_OTHER): Payer: Self-pay

## 2019-07-12 ENCOUNTER — Other Ambulatory Visit (INDEPENDENT_AMBULATORY_CARE_PROVIDER_SITE_OTHER): Payer: Self-pay | Admitting: Internal Medicine

## 2019-07-12 ENCOUNTER — Other Ambulatory Visit: Payer: Self-pay

## 2019-07-12 ENCOUNTER — Ambulatory Visit (HOSPITAL_COMMUNITY): Payer: Self-pay

## 2019-07-12 ENCOUNTER — Ambulatory Visit (HOSPITAL_COMMUNITY)
Admission: RE | Admit: 2019-07-12 | Discharge: 2019-07-12 | Disposition: A | Discharge status: Home / Self Care | Payer: Self-pay | Source: Ambulatory Visit | Attending: Internal Medicine | Admitting: Internal Medicine

## 2019-07-12 DIAGNOSIS — M25511 Pain in right shoulder: Secondary | ICD-10-CM | POA: Insufficient documentation

## 2019-07-17 ENCOUNTER — Encounter (INDEPENDENT_AMBULATORY_CARE_PROVIDER_SITE_OTHER): Payer: Self-pay | Admitting: Internal Medicine

## 2019-07-18 ENCOUNTER — Other Ambulatory Visit: Payer: Self-pay

## 2019-07-18 ENCOUNTER — Ambulatory Visit (INDEPENDENT_AMBULATORY_CARE_PROVIDER_SITE_OTHER): Payer: Self-pay | Admitting: Orthopedic Surgery

## 2019-07-18 ENCOUNTER — Encounter: Payer: Self-pay | Admitting: Orthopedic Surgery

## 2019-07-18 VITALS — BP 121/87 | HR 75 | Temp 97.1°F | Ht 67.0 in | Wt 180.0 lb

## 2019-07-18 DIAGNOSIS — M75121 Complete rotator cuff tear or rupture of right shoulder, not specified as traumatic: Secondary | ICD-10-CM

## 2019-07-18 NOTE — Progress Notes (Signed)
Lisa Ryan  07/18/2019  Body mass index is 28.19 kg/m.   HISTORY SECTION :  Chief Complaint  Patient presents with  . Shoulder Pain    Right shoulder pain, referred by Dr. Anastasio Champion. No injury.   44 year old female referral from Dr. Anastasio Champion presents with a positive MRI for torn rotator cuff with [facility] joint arthritis complaining of right shoulder pain no history of trauma, complains of decreased range of motion.  She did take ibuprofen 800 mg as needed for pain  She says it hurts when she is typing hurts when she tries to lift the arm she has tried heat ice and Biofreeze as well  She is a smoker no history of diabetes   Review of Systems  Musculoskeletal: Positive for joint pain.  All other systems reviewed and are negative.    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   Past Surgical History:  Procedure Laterality Date  . ANKLE SURGERY     left  . APPENDECTOMY    . CESAREAN SECTION     x3  . CHOLECYSTECTOMY    . DILITATION & CURRETTAGE/HYSTROSCOPY WITH NOVASURE ABLATION N/A 12/26/2015   Procedure: DILATATION & CURETTAGE/HYSTEROSCOPY WITH ENDOMETRIAL ABLATION;  Surgeon: Jonnie Kind, MD;  Location: [facility];  Service: Gynecology;  Laterality: N/A;  . KNEE ARTHROSCOPY     left  . MOUTH LESION EXCISIONAL BIOPSY  04/2011   pre-cancerous, pallet of mouth    Body mass index is 28.19 kg/m.   Allergies  Allergen  Reactions  . Albuterol Anxiety and Other (See Comments)    Increased HR 130s (pt tolerates duoneb)  . Morphine Other (See Comments)    hallucinations     Current Outpatient Medications:  .  DULoxetine (CYMBALTA) 30 MG capsule, Take 3 capsules (90 mg total) by mouth at bedtime., Disp: 90 capsule, Rfl: 2 .  ibuprofen (ADVIL) 800 MG tablet, TAKE 1 TABLET BY MOUTH THREE TIMES DAILY, Disp: 30 tablet, Rfl: 1 .  ipratropium-albuterol (DUONEB) 0.5-2.5 (3) MG/3ML SOLN, Take 3 mLs by nebulization every 6 (six) hours as needed., Disp: 360 mL, Rfl: 0 .  levalbuterol (XOPENEX HFA) 45 MCG/ACT inhaler, Inhale 1-2 puffs into the lungs daily as needed for wheezing or shortness of breath., Disp: , Rfl:  .  progesterone (PROMETRIUM) 200 MG capsule, Take 2 capsules (400 mg total) by mouth at bedtime., Disp: 60 capsule, Rfl: 3 .  ALPRAZolam (XANAX) 0.5 MG tablet, Take 1 tablet (0.5 mg total) by mouth 2 (two) times daily as needed for anxiety. (Patient not taking: Reported on 07/03/2018), Disp: 10 tablet, Rfl: 0 .  fluticasone-salmeterol (ADVAIR HFA) 115-21 MCG/ACT inhaler, Inhale 2 puffs into the lungs 2 (two) times daily. (Patient not taking: Reported on 07/03/2018), Disp: 1 Inhaler, Rfl: 5 .  naproxen sodium (ALEVE) 220 MG tablet, Take 660 mg by mouth daily as needed (pain)., Disp: , Rfl:  .  nicotine (NICODERM CQ - DOSED IN MG/24 HOURS) 14 mg/24hr patch, Place 1 patch (14 mg total) onto the skin daily. (Patient not taking: Reported on 07/03/2018), Disp: 30 patch, Rfl: 0 .  nitroGLYCERIN (NITROSTAT) 0.4 MG SL tablet, Place 1  tablet (0.4 mg total) under the tongue every 5 (five) minutes as needed for chest pain., Disp: 30 tablet, Rfl: 0 .  omeprazole (PRILOSEC) 20 MG capsule, Take 1 capsule (20 mg total) by mouth daily. (Patient not taking: Reported on 07/03/2018), Disp: 30 capsule, Rfl: 0 .  oxyCODONE-acetaminophen (PERCOCET/ROXICET) 5-325 MG tablet, Take 2 tablets by mouth every 4 (four) hours as needed for severe pain.  (Patient not taking: Reported on 12/26/2018), Disp: 15 tablet, Rfl: 0 .  predniSONE (DELTASONE) 20 MG tablet, Take 2 tablets (40 mg total) by mouth daily with breakfast., Disp: 10 tablet, Rfl: 1 .  promethazine (PHENERGAN) 25 MG tablet, Take 1 tablet (25 mg total) by mouth every 6 (six) hours as needed for nausea or vomiting. (Patient not taking: Reported on 12/26/2018), Disp: 30 tablet, Rfl: 0 .  Spacer/Aero-Holding Chambers (AEROCHAMBER MV) inhaler, Use as instructed, Disp: 1 each, Rfl: 0 .  varenicline (CHANTIX) 0.5 MG tablet, Take 1 tablet (0.5 mg total) by mouth 2 (two) times daily. (Patient not taking: Reported on 07/03/2018), Disp: 30 tablet, Rfl: 1   PHYSICAL EXAM SECTION: 1) BP 121/87   Pulse 75   Temp (!) 97.1 F (36.2 C)   Ht 5\' 7"  (1.702 m)   Wt 180 lb (81.6 kg)   BMI 28.19 kg/m   Body mass index is 28.19 kg/m. General appearance: Well-developed well-nourished no gross deformities  2) Cardiovascular normal pulse and perfusion , normal color   3) Neurologically deep tendon reflexes are equal and normal, no sensation loss or deficits no pathologic reflexes  4) Psychological: Awake alert and oriented x3 mood and affect normal  5) Skin no lacerations or ulcerations no nodularity no palpable masses, no erythema or nodularity  6) Musculoskeletal:   Right shoulder Tenderness in the rotator interval lateral deltoid  Range of motion: External rotation with the arm at her side is full at 50 degrees, abduction 80 degrees, flexion 90 degrees active passive 120 with extreme and severe pain  Subluxation inferiorly none, abduction external rotation could not get the arm in that position  External rotation strength was intact internal rotation strength was intact abduction and flexion strength was gravity only  MEDICAL DECISION MAKING  A.  Encounter Diagnosis  Name Primary?  . Complete tear of right rotator cuff, unspecified whether traumatic Yes    B. DATA ANALYSED:  IMAGING:  MRI right shoulder on July 12, 2019   Independent interpretation of images: MRI small full-thickness tear anterior margin supraspinatus tendon with no atrophy normal biceps moderate AC joint arthritis type II acromion normal glenohumeral joint normal labrum  X-ray: AC joint arthritis mild, the humeral head inferior margin glenoid distance shows slight proximal migration of the humerus there is some sclerosis in the undersurface of the acromion  Orders: None  Outside records reviewed: None  C. MANAGEMENT   1) after looking at her MRI and x-ray, she has a small rotator cuff tear anteriorly it is full-thickness.  She has swelling fluid in the acromioclavicular joint which on x-ray does not look bad and clinically is asymptomatic  2) we gave her other treatment options other than surgery which include injection oral medication and physical therapy  3) she wants to have a second opinion already set up with Dr. Onnie Graham  She is going to call us to let us know what she wants to do  No orders of the defined types were placed in this encounter.     Arther Abbott, MD  07/18/2019 11:15 AM

## 2019-07-18 NOTE — Patient Instructions (Addendum)
Call if scheduling surgery    Surgery for Rotator Cuff Tear The rotator cuff is a group of muscles and connective tissues (tendons) that surround the shoulder joint and keep the upper arm bone (humerus) in the shoulder socket. A tendon is the place on a muscle where it attaches to a bone. Surgery may be done to repair a partial or complete tear in the rotator cuff that cannot be treated by nonsurgical methods. The exact procedure that you will have depends on your injury. If you have a partial tear, you may have surgery to reattach a tendon to the humerus. If you have a complete tear, you may have surgery to sew the two sides of the tear back together. Surgery may be done through small incisions using an operating telescope (arthroscope), through a larger (open) incision, or through a combination of both. Tell a health care provider about:  Any allergies you have.  All medicines you are taking, including vitamins, herbs, eye drops, creams, and over-the-counter medicines.  Any problems you or family members have had with anesthetic medicines.  Any blood disorders you have.  Any surgeries you have had.  Any medical conditions you have.  Whether you are pregnant or may be pregnant. What are the risks? Generally, this is a safe procedure. However, problems may occur, including:  Infection.  Bleeding.  Allergic reactions to medicines or materials used during the procedure.  Damage to nerves, blood vessels, or shoulder muscles.  Permanent loss of full shoulder movement (stiffness). What happens before the procedure? Medicines Ask your health care provider about:  Changing or stopping your regular medicines. This is especially important if you are taking diabetes medicines or blood thinners.  Taking medicines such as aspirin and ibuprofen. These medicines can thin your blood. Do not take these medicines unless your health care provider tells you to take them.  Taking  over-the-counter medicines, vitamins, herbs, and supplements. Eating and drinking Follow instructions from your health care provider about eating and drinking, which may include:  8 hours before the procedure - stop eating heavy meals or foods, such as meat, fried foods, or fatty foods.  6 hours before the procedure - stop eating light meals or foods, such as toast or cereal.  6 hours before the procedure - stop drinking milk or drinks that contain milk.  2 hours before the procedure - stop drinking clear liquids. Staying hydrated Follow instructions from your health care provider about hydration, which may include:  Up to 2 hours before the procedure - you may continue to drink clear liquids, such as water, clear fruit juice, black coffee, and plain tea. General instructions   Plan to have someone take you home from the hospital or clinic.  Plan to have a responsible adult care for you for at least 24 hours after you leave the hospital or clinic. This is important.  Ask your health care provider what steps will be taken to help prevent infection. These may include: ? Removing hair at the surgery site. ? Washing skin with a germ-killing soap. ? Taking antibiotic medicine.  Do not use any products that contain nicotine or tobacco for at least 4 weeks before the procedure. These products include cigarettes, e-cigarettes, and chewing tobacco. If you need help quitting, ask your health care provider. What happens during the procedure?   An IV will be inserted into one of your veins.  You will be given one or more of the following: ? A medicine to help you  relax (sedative). ? A medicine to make you fall asleep (general anesthetic). ? A medicine that is injected into an area of your body to numb everything beyond the injection site (regional anesthetic).  Your surgeon will move your shoulder to observe your injury.  Your surgeon will make incisions based on the type of surgery you  are having. ? If you are having arthroscopic surgery:  Small incisions will be made in the front and back of your shoulder.  An arthroscope will be inserted through these incisions to examine the inside of your shoulder and plan the surgery. ? If you are having open surgery, a wider incision will be made in your shoulder.  Some of the muscle covering your shoulder (deltoid muscle) may be moved to expose your rotator cuff.  Bony growths that might interfere with healing will be removed.  Your rotator cuff will be trimmed around the area where it has torn away from your humerus.  If your rotator cuff is completely torn, the split ends will be sewn back together.  Anchoring inserts will be placed into your humerus in the area where the tendon has torn away from the bone.  The torn end of your rotator cuff will be reattached (anchored) to your humerus using stitches and small screws.  Your incisions will be closed with stitches (sutures).  The incision in your skin will be covered with a bandage (dressing) and medicine.  Your arm will be placed in a sling or a shoulder immobilizer. A shoulder immobilizer keeps your arm from moving (immobilizes your arm) while the injured shoulder heals. The procedure may vary among health care providers and hospitals. What happens after the procedure?  Your blood pressure, heart rate, breathing rate, and blood oxygen level will be monitored until you leave the hospital or clinic.  You may have some pain. Medicines will be available to help you.  Do not drive for 24 hours if you were given a sedative during your procedure. Summary  The rotator cuff is a group of muscles and connective tissues (tendons) that surround the shoulder joint and keep the upper arm bone (humerus) in the shoulder socket.  Surgery may be done to repair a partial or complete tear in a rotator cuff.  Follow instructions from your health care provider about taking medicines and  about eating and drinking before the procedure.  After surgery, your arm will be in a sling or a shoulder immobilizer. This information is not intended to replace advice given to you by your health care provider. Make sure you discuss any questions you have with your health care provider. Document Revised: 01/23/2018 Document Reviewed: 01/25/2018 Elsevier Patient Education  2020 [employer].

## 2019-07-24 ENCOUNTER — Other Ambulatory Visit (INDEPENDENT_AMBULATORY_CARE_PROVIDER_SITE_OTHER): Payer: Self-pay | Admitting: Internal Medicine

## 2019-07-25 ENCOUNTER — Other Ambulatory Visit (INDEPENDENT_AMBULATORY_CARE_PROVIDER_SITE_OTHER): Payer: Self-pay | Admitting: Internal Medicine

## 2019-08-02 ENCOUNTER — Ambulatory Visit (HOSPITAL_COMMUNITY): Payer: Self-pay

## 2019-08-03 ENCOUNTER — Ambulatory Visit (HOSPITAL_COMMUNITY): Payer: Self-pay

## 2019-08-07 ENCOUNTER — Encounter (HOSPITAL_COMMUNITY): Payer: Self-pay

## 2019-08-07 NOTE — Patient Instructions (Addendum)
DUE TO COVID-19 ONLY TWO VISITORS ARE ALLOWED TO COME WITH YOU AND STAY IN THE WAITING ROOM ONLY DURING PRE OP AND PROCEDURE. THE TWO VISITORS MAY VISIT WITH YOU IN YOUR PRIVATE ROOM DURING VISITING HOURS ONLY!!   COVID SWAB TESTING MUST BE COMPLETED ON: Monday, August 13, 2019 at 11:05 AM    [facility] (Must self quarantine after testing. Follow instructions on handout.)             Your procedure is scheduled on: Thursday, August 16, 2019   Report to [facility] Main  Entrance    Report to admitting at 9:45 AM   Call this number if you have problems the morning of surgery [phone]   Do not eat food :After Midnight.   May have liquids until 8:45 AM day of surgery   CLEAR LIQUID DIET  Foods Allowed                                                                     Foods Excluded  Water, Black Coffee and tea, regular and decaf                             liquids that you cannot  Plain Jell-O in any flavor  (No red)                                           see through such as: Fruit ices (not with fruit pulp)                                     milk, soups, orange juice  Iced Popsicles (No red)                                    All solid food Carbonated beverages, regular and diet                                    Apple juices Sports drinks like Gatorade (No red) Lightly seasoned clear broth or consume(fat free) Sugar, honey syrup  Sample Menu Breakfast                                Lunch                                     Supper Cranberry juice                    Beef broth                            Chicken broth Jell-O  Grape juice                           Apple juice Coffee or tea                        Jell-O                                      Popsicle                                                Coffee or tea                        Coffee or tea   Complete one Ensure drink the morning of surgery at 8:45 AM   the day of surgery.   Oral Hygiene is also important to reduce your risk of infection.                                    Remember - BRUSH YOUR TEETH THE MORNING OF SURGERY WITH YOUR REGULAR TOOTHPASTE   Do NOT smoke after Midnight   Take these medicines the morning of surgery with A SIP OF WATER: Thyroid   Bring Inhaler day of surgery                               You may not have any metal on your body including hair pins, jewelry, and body piercings             Do not wear make-up, lotions, powders, perfumes/cologne, or deodorant             Do not wear nail polish.  Do not shave  48 hours prior to surgery.              Do not bring valuables to the hospital. [place].   Contacts, dentures or bridgework may not be worn into surgery.    Patients discharged the day of surgery will not be allowed to drive home.   Special Instructions: Bring a copy of your healthcare power of attorney and living will documents         the day of surgery if you haven't scanned them in before.              Please read over the following fact sheets you were given:  [facility] - Preparing for Surgery Before surgery, you can play an important role.  Because skin is not sterile, your skin needs to be as free of germs as possible.  You can reduce the number of germs on your skin by washing with CHG (chlorahexidine gluconate) soap before surgery.  CHG is an antiseptic cleaner which kills germs and bonds with the skin to continue killing germs even after washing. Please DO NOT use if you have an allergy to CHG or antibacterial soaps.  If your skin becomes reddened/irritated stop using the CHG and  inform your nurse when you arrive at Short Stay. Do not shave (including legs and underarms) for at least 48 hours prior to the first CHG shower.  You may shave your face/neck.  Please follow these instructions carefully:  1.  Shower with CHG Soap the night before  surgery and the  morning of surgery.  2.  If you choose to wash your hair, wash your hair first as usual with your normal  shampoo.  3.  After you shampoo, rinse your hair and body thoroughly to remove the shampoo.                             4.  Use CHG as you would any other liquid soap.  You can apply chg directly to the skin and wash.  Gently with a scrungie or clean washcloth.  5.  Apply the CHG Soap to your body ONLY FROM THE NECK DOWN.   Do   not use on face/ open                           Wound or open sores. Avoid contact with eyes, ears mouth and   genitals (private parts).                       Wash face,  Genitals (private parts) with your normal soap.             6.  Wash thoroughly, paying special attention to the area where your    surgery  will be performed.  7.  Thoroughly rinse your body with warm water from the neck down.  8.  DO NOT shower/wash with your normal soap after using and rinsing off the CHG Soap.                9.  Pat yourself dry with a clean towel.            10.  Wear clean pajamas.            11.  Place clean sheets on your bed the night of your first shower and do not  sleep with pets. Day of Surgery : Do not apply any lotions/deodorants the morning of surgery.  Please wear clean clothes to the hospital/surgery center.  FAILURE TO FOLLOW THESE INSTRUCTIONS MAY RESULT IN THE CANCELLATION OF YOUR SURGERY  PATIENT SIGNATURE_________________________________  NURSE SIGNATURE__________________________________  ________________________________________________________________________

## 2019-08-08 ENCOUNTER — Encounter (HOSPITAL_COMMUNITY)
Admission: RE | Admit: 2019-08-08 | Discharge: 2019-08-08 | Disposition: A | Discharge status: Home / Self Care | Payer: 59 | Source: Ambulatory Visit | Attending: Orthopedic Surgery | Admitting: Orthopedic Surgery

## 2019-08-08 ENCOUNTER — Other Ambulatory Visit: Payer: Self-pay

## 2019-08-08 ENCOUNTER — Encounter (HOSPITAL_COMMUNITY): Payer: Self-pay

## 2019-08-08 DIAGNOSIS — Z01812 Encounter for preprocedural laboratory examination: Secondary | ICD-10-CM | POA: Diagnosis present

## 2019-08-08 HISTORY — DX: Personal history of other endocrine, nutritional and metabolic disease: Z86.39

## 2019-08-08 HISTORY — DX: Obesity, unspecified: E66.9

## 2019-08-08 HISTORY — DX: Diverticulosis of intestine, part unspecified, without perforation or abscess without bleeding: K57.90

## 2019-08-08 HISTORY — DX: Stress incontinence (female) (male): N39.3

## 2019-08-08 HISTORY — DX: Unspecified asthma, uncomplicated: J45.909

## 2019-08-08 HISTORY — DX: Hypothyroidism, unspecified: E03.9

## 2019-08-08 HISTORY — DX: Fatty (change of) liver, not elsewhere classified: K76.0

## 2019-08-08 HISTORY — DX: Unspecified osteoarthritis, unspecified site: M19.90

## 2019-08-08 LAB — CBC
HCT: 41.6 % (ref 36.0–46.0)
Hemoglobin: 14 g/dL (ref 12.0–15.0)
MCH: 33.5 pg (ref 26.0–34.0)
MCHC: 33.7 g/dL (ref 30.0–36.0)
MCV: 99.5 fL (ref 80.0–100.0)
Platelets: 281 10*3/uL (ref 150–400)
RBC: 4.18 MIL/uL (ref 3.87–5.11)
RDW: 12.3 % (ref 11.5–15.5)
WBC: 4.9 10*3/uL (ref 4.0–10.5)
nRBC: 0 % (ref 0.0–0.2)

## 2019-08-08 NOTE — Progress Notes (Signed)
PCP - Dr. Delane Ginger. [place] Cardiologist - Dr. Estevan Ryder Pulmonologist-Dr. D. McQuiad  Chest x-ray - 12/25/18 in epic EKG - 12/26/18 in epic Stress Test - N/A ECHO - greater than 2 years Cardiac Cath - greater than 2 years  Sleep Study - N/A CPAP - N/A  Fasting Blood Sugar - N/A Checks Blood Sugar __N/A___ times a day  Blood Thinner Instructions: N/A Aspirin Instructions: N/A Last Dose: N/A  Anesthesia review: COPD, CHF/cardiomyopathy complication from delivery and c section  Patient denies shortness of breath, fever, cough and chest pain at PAT appointment   Patient verbalized understanding of instructions that were given to them at the PAT appointment. Patient was also instructed that they will need to review over the PAT instructions again at home before surgery.

## 2019-08-13 ENCOUNTER — Other Ambulatory Visit: Payer: Self-pay

## 2019-08-13 ENCOUNTER — Other Ambulatory Visit (HOSPITAL_COMMUNITY)
Admission: RE | Admit: 2019-08-13 | Discharge: 2019-08-13 | Disposition: A | Discharge status: Home / Self Care | Payer: 59 | Source: Ambulatory Visit | Attending: Orthopedic Surgery | Admitting: Orthopedic Surgery

## 2019-08-13 DIAGNOSIS — Z20822 Contact with and (suspected) exposure to covid-19: Secondary | ICD-10-CM | POA: Insufficient documentation

## 2019-08-13 DIAGNOSIS — Z01812 Encounter for preprocedural laboratory examination: Secondary | ICD-10-CM | POA: Diagnosis present

## 2019-08-13 LAB — SARS CORONAVIRUS 2 (TAT 6-24 HRS): SARS Coronavirus 2: NEGATIVE

## 2019-08-14 ENCOUNTER — Other Ambulatory Visit (INDEPENDENT_AMBULATORY_CARE_PROVIDER_SITE_OTHER): Payer: Self-pay | Admitting: Internal Medicine

## 2019-08-16 ENCOUNTER — Ambulatory Visit (HOSPITAL_COMMUNITY): Payer: 59 | Admitting: Certified Registered Nurse Anesthetist

## 2019-08-16 ENCOUNTER — Encounter (HOSPITAL_COMMUNITY): Payer: Self-pay | Admitting: Orthopedic Surgery

## 2019-08-16 ENCOUNTER — Ambulatory Visit (HOSPITAL_COMMUNITY)
Admission: RE | Admit: 2019-08-16 | Discharge: 2019-08-16 | Disposition: A | Discharge status: Home / Self Care | Payer: 59 | Attending: Orthopedic Surgery | Admitting: Orthopedic Surgery

## 2019-08-16 ENCOUNTER — Encounter (HOSPITAL_COMMUNITY)
Admission: RE | Disposition: A | Discharge status: Home / Self Care | Payer: Self-pay | Source: Home / Self Care | Attending: Orthopedic Surgery

## 2019-08-16 ENCOUNTER — Other Ambulatory Visit: Payer: Self-pay

## 2019-08-16 DIAGNOSIS — E039 Hypothyroidism, unspecified: Secondary | ICD-10-CM | POA: Diagnosis not present

## 2019-08-16 DIAGNOSIS — Z8673 Personal history of transient ischemic attack (TIA), and cerebral infarction without residual deficits: Secondary | ICD-10-CM | POA: Insufficient documentation

## 2019-08-16 DIAGNOSIS — M19011 Primary osteoarthritis, right shoulder: Secondary | ICD-10-CM | POA: Insufficient documentation

## 2019-08-16 DIAGNOSIS — K219 Gastro-esophageal reflux disease without esophagitis: Secondary | ICD-10-CM | POA: Diagnosis not present

## 2019-08-16 DIAGNOSIS — I739 Peripheral vascular disease, unspecified: Secondary | ICD-10-CM | POA: Insufficient documentation

## 2019-08-16 DIAGNOSIS — M7541 Impingement syndrome of right shoulder: Secondary | ICD-10-CM | POA: Insufficient documentation

## 2019-08-16 DIAGNOSIS — Z6828 Body mass index (BMI) 28.0-28.9, adult: Secondary | ICD-10-CM | POA: Diagnosis not present

## 2019-08-16 DIAGNOSIS — I509 Heart failure, unspecified: Secondary | ICD-10-CM | POA: Diagnosis not present

## 2019-08-16 DIAGNOSIS — F419 Anxiety disorder, unspecified: Secondary | ICD-10-CM | POA: Diagnosis not present

## 2019-08-16 DIAGNOSIS — J449 Chronic obstructive pulmonary disease, unspecified: Secondary | ICD-10-CM | POA: Insufficient documentation

## 2019-08-16 DIAGNOSIS — M75121 Complete rotator cuff tear or rupture of right shoulder, not specified as traumatic: Secondary | ICD-10-CM | POA: Diagnosis not present

## 2019-08-16 DIAGNOSIS — Z79890 Hormone replacement therapy (postmenopausal): Secondary | ICD-10-CM | POA: Insufficient documentation

## 2019-08-16 DIAGNOSIS — E669 Obesity, unspecified: Secondary | ICD-10-CM | POA: Diagnosis not present

## 2019-08-16 DIAGNOSIS — F17210 Nicotine dependence, cigarettes, uncomplicated: Secondary | ICD-10-CM | POA: Insufficient documentation

## 2019-08-16 DIAGNOSIS — M75101 Unspecified rotator cuff tear or rupture of right shoulder, not specified as traumatic: Secondary | ICD-10-CM

## 2019-08-16 HISTORY — PX: SHOULDER ARTHROSCOPY WITH ROTATOR CUFF REPAIR: SHX5685

## 2019-08-16 SURGERY — ARTHROSCOPY, SHOULDER, WITH ROTATOR CUFF REPAIR
Anesthesia: General | Site: Shoulder | Laterality: Right

## 2019-08-16 MED ORDER — MIDAZOLAM HCL 2 MG/2ML IJ SOLN
INTRAMUSCULAR | Status: DC | PRN
  Administered 2019-08-16: 2 mg via INTRAVENOUS

## 2019-08-16 MED ORDER — TRAMADOL HCL 50 MG PO TABS: 50.0000 mg | ORAL_TABLET | Freq: Four times a day (QID) | ORAL | 0 refills | Status: DC | PRN

## 2019-08-16 MED ORDER — LIDOCAINE 2% (20 MG/ML) 5 ML SYRINGE
INTRAMUSCULAR | Status: DC | PRN
  Administered 2019-08-16: 100 mg via INTRAVENOUS

## 2019-08-16 MED ORDER — MEPERIDINE HCL 50 MG/ML IJ SOLN: 6.2500 mg | INTRAMUSCULAR | Status: DC | PRN

## 2019-08-16 MED ORDER — SCOPOLAMINE 1 MG/3DAYS TD PT72
MEDICATED_PATCH | TRANSDERMAL | Status: AC
  Filled 2019-08-16: qty 1

## 2019-08-16 MED ORDER — FENTANYL CITRATE (PF) 100 MCG/2ML IJ SOLN
INTRAMUSCULAR | Status: AC
  Filled 2019-08-16: qty 2

## 2019-08-16 MED ORDER — LACTATED RINGERS IV SOLN: INTRAVENOUS | Status: DC | PRN

## 2019-08-16 MED ORDER — PROPOFOL 10 MG/ML IV BOLUS
INTRAVENOUS | Status: DC | PRN
  Administered 2019-08-16: 160 mg via INTRAVENOUS

## 2019-08-16 MED ORDER — LACTATED RINGERS IV SOLN: INTRAVENOUS | Status: DC

## 2019-08-16 MED ORDER — PHENYLEPHRINE HCL (PRESSORS) 10 MG/ML IV SOLN
INTRAVENOUS | Status: AC
  Filled 2019-08-16: qty 1

## 2019-08-16 MED ORDER — PROMETHAZINE HCL 25 MG/ML IJ SOLN
INTRAMUSCULAR | Status: AC
  Administered 2019-08-16: 15:00:00 6.25 mg via INTRAVENOUS
  Filled 2019-08-16: qty 1

## 2019-08-16 MED ORDER — CYCLOBENZAPRINE HCL 10 MG PO TABS: 10.0000 mg | ORAL_TABLET | Freq: Three times a day (TID) | ORAL | 1 refills | Status: DC | PRN

## 2019-08-16 MED ORDER — NAPROXEN 500 MG PO TABS: 500.0000 mg | ORAL_TABLET | Freq: Two times a day (BID) | ORAL | 1 refills | Status: DC

## 2019-08-16 MED ORDER — ONDANSETRON HCL 4 MG PO TABS: 4.0000 mg | ORAL_TABLET | Freq: Three times a day (TID) | ORAL | 0 refills | Status: DC | PRN

## 2019-08-16 MED ORDER — SUGAMMADEX SODIUM 200 MG/2ML IV SOLN
INTRAVENOUS | Status: DC | PRN
  Administered 2019-08-16: 300 mg via INTRAVENOUS

## 2019-08-16 MED ORDER — FENTANYL CITRATE (PF) 250 MCG/5ML IJ SOLN
INTRAMUSCULAR | Status: DC | PRN
  Administered 2019-08-16 (×2): 50 ug via INTRAVENOUS

## 2019-08-16 MED ORDER — SODIUM CHLORIDE 0.9 % IR SOLN
Status: DC | PRN
  Administered 2019-08-16 (×4): 3000 mL

## 2019-08-16 MED ORDER — 0.9 % SODIUM CHLORIDE (POUR BTL) OPTIME
TOPICAL | Status: DC | PRN
  Administered 2019-08-16: 1000 mL

## 2019-08-16 MED ORDER — PHENYLEPHRINE HCL-NACL 10-0.9 MG/250ML-% IV SOLN
INTRAVENOUS | Status: DC | PRN
  Administered 2019-08-16: 25 ug/min via INTRAVENOUS

## 2019-08-16 MED ORDER — ONDANSETRON HCL 4 MG/2ML IJ SOLN
INTRAMUSCULAR | Status: DC | PRN
  Administered 2019-08-16: 4 mg via INTRAVENOUS

## 2019-08-16 MED ORDER — ROCURONIUM BROMIDE 10 MG/ML (PF) SYRINGE
PREFILLED_SYRINGE | INTRAVENOUS | Status: DC | PRN
  Administered 2019-08-16: 20 mg via INTRAVENOUS
  Administered 2019-08-16: 50 mg via INTRAVENOUS

## 2019-08-16 MED ORDER — DEXAMETHASONE SODIUM PHOSPHATE 10 MG/ML IJ SOLN
INTRAMUSCULAR | Status: DC | PRN
  Administered 2019-08-16: 10 mg via INTRAVENOUS

## 2019-08-16 MED ORDER — MIDAZOLAM HCL 2 MG/2ML IJ SOLN
INTRAMUSCULAR | Status: AC
  Filled 2019-08-16: qty 2

## 2019-08-16 MED ORDER — FENTANYL CITRATE (PF) 100 MCG/2ML IJ SOLN: 25.0000 ug | INTRAMUSCULAR | Status: DC | PRN

## 2019-08-16 MED ORDER — OXYCODONE-ACETAMINOPHEN 5-325 MG PO TABS: 1.0000 | ORAL_TABLET | ORAL | 0 refills | Status: DC | PRN

## 2019-08-16 MED ORDER — PROMETHAZINE HCL 25 MG/ML IJ SOLN
6.2500 mg | INTRAMUSCULAR | Status: AC | PRN
  Administered 2019-08-16: 12.5 mg via INTRAVENOUS

## 2019-08-16 MED ORDER — MIDAZOLAM HCL 2 MG/2ML IJ SOLN
1.0000 mg | INTRAMUSCULAR | Status: DC
  Administered 2019-08-16: 2 mg via INTRAVENOUS
  Filled 2019-08-16: qty 2

## 2019-08-16 MED ORDER — CEFAZOLIN SODIUM-DEXTROSE 2-4 GM/100ML-% IV SOLN
2.0000 g | INTRAVENOUS | Status: DC
  Filled 2019-08-16: qty 100

## 2019-08-16 MED ORDER — FENTANYL CITRATE (PF) 100 MCG/2ML IJ SOLN
50.0000 ug | INTRAMUSCULAR | Status: DC
  Administered 2019-08-16: 100 ug via INTRAVENOUS
  Filled 2019-08-16: qty 2

## 2019-08-16 SURGICAL SUPPLY — 63 items
ANCHOR PEEK 4.75X19.1 SWLK C (Anchor) ×6 IMPLANT
BLADE EXCALIBUR 4.0MM X 13CM (MISCELLANEOUS) ×1
BLADE EXCALIBUR 4.0X13 (MISCELLANEOUS) ×2 IMPLANT
BOOTIES KNEE HIGH SLOAN (MISCELLANEOUS) ×6 IMPLANT
BURR OVAL 8 FLU 4.0MM X 13CM (MISCELLANEOUS) ×1
BURR OVAL 8 FLU 4.0X13 (MISCELLANEOUS) ×2 IMPLANT
BURR OVAL 8 FLU 5.0MM X 13CM (MISCELLANEOUS) ×1
BURR OVAL 8 FLU 5.0X13 (MISCELLANEOUS) ×2 IMPLANT
CANNULA ACUFLEX KIT 5X76 (CANNULA) ×3 IMPLANT
CANNULA DRILOCK 5.0MMX75MM (CANNULA) ×1
CANNULA DRILOCK 5.0X75 (CANNULA) ×1 IMPLANT
CANNULA TWIST IN 8.25X7CM (CANNULA) ×2 IMPLANT
CLOSURE WOUND 1/2 X4 (GAUZE/BANDAGES/DRESSINGS) ×1
CONNECTOR 5 IN 1 STRAIGHT STRL (MISCELLANEOUS) ×3 IMPLANT
COOLER ICEMAN CLASSIC (MISCELLANEOUS) IMPLANT
COVER WAND RF STERILE (DRAPES) ×3 IMPLANT
DERMABOND ADVANCED (GAUZE/BANDAGES/DRESSINGS) ×2
DERMABOND ADVANCED .7 DNX12 (GAUZE/BANDAGES/DRESSINGS) IMPLANT
DISSECTOR  3.8MM X 13CM (MISCELLANEOUS) ×3
DISSECTOR 3.8MM X 13CM (MISCELLANEOUS) ×1 IMPLANT
DRAPE INCISE 23X17 IOBAN STRL (DRAPES) ×2
DRAPE INCISE 23X17 STRL (DRAPES) ×1 IMPLANT
DRAPE INCISE IOBAN 23X17 STRL (DRAPES) ×1 IMPLANT
DRAPE INCISE IOBAN 66X45 STRL (DRAPES) ×3 IMPLANT
DRAPE ORTHO SPLIT 77X108 STRL (DRAPES) ×6
DRAPE STERI 35X30 U-POUCH (DRAPES) ×3 IMPLANT
DRAPE SURG 17X11 SM STRL (DRAPES) ×3 IMPLANT
DRAPE SURG ORHT 6 SPLT 77X108 (DRAPES) ×2 IMPLANT
DRAPE U-SHAPE 47X51 STRL (DRAPES) ×3 IMPLANT
DRSG PAD ABDOMINAL 8X10 ST (GAUZE/BANDAGES/DRESSINGS) ×6 IMPLANT
DURAPREP 26ML APPLICATOR (WOUND CARE) ×3 IMPLANT
GAUZE SPONGE 4X4 12PLY STRL (GAUZE/BANDAGES/DRESSINGS) ×3 IMPLANT
GLOVE BIO SURGEON STRL SZ7.5 (GLOVE) ×3 IMPLANT
GLOVE BIO SURGEON STRL SZ8 (GLOVE) ×3 IMPLANT
GLOVE SS BIOGEL STRL SZ 7 (GLOVE) ×2 IMPLANT
GLOVE SS BIOGEL STRL SZ 7.5 (GLOVE) ×1 IMPLANT
GLOVE SUPERSENSE BIOGEL SZ 7 (GLOVE) ×4
GLOVE SUPERSENSE BIOGEL SZ 7.5 (GLOVE) ×2
GOWN STRL REUS W/TWL LRG LVL3 (GOWN DISPOSABLE) ×6 IMPLANT
KIT BASIN OR (CUSTOM PROCEDURE TRAY) ×3 IMPLANT
KIT SHOULDER TRACTION (DRAPES) ×3 IMPLANT
KIT TURNOVER KIT A (KITS) IMPLANT
MANIFOLD NEPTUNE II (INSTRUMENTS) ×3 IMPLANT
NDL SCORPION MULTI FIRE (NEEDLE) IMPLANT
NEEDLE SCORPION MULTI FIRE (NEEDLE) ×3 IMPLANT
NS IRRIG 1000ML POUR BTL (IV SOLUTION) ×3 IMPLANT
PACK ARTHROSCOPY DSU (CUSTOM PROCEDURE TRAY) ×3 IMPLANT
PAD ARMBOARD 7.5X6 YLW CONV (MISCELLANEOUS) ×3 IMPLANT
PAD COLD SHLDR WRAP-ON (PAD) IMPLANT
PROBE APOLLO 90XL (SURGICAL WAND) ×3 IMPLANT
SLING ARM FOAM STRAP MED (SOFTGOODS) IMPLANT
SPONGE LAP 4X18 RFD (DISPOSABLE) ×3 IMPLANT
STRIP CLOSURE SKIN 1/2X4 (GAUZE/BANDAGES/DRESSINGS) ×2 IMPLANT
SUT FIBERWIRE #2 38 T-5 BLUE (SUTURE)
SUT MNCRL AB 3-0 PS2 18 (SUTURE) ×3 IMPLANT
SUT PDS AB 0 CT 36 (SUTURE) IMPLANT
SUT TIGER TAPE 7 IN WHITE (SUTURE) ×3 IMPLANT
SUTURE FIBERWR #2 38 T-5 BLUE (SUTURE) IMPLANT
TAPE FIBER 2MM 7IN #2 BLUE (SUTURE) IMPLANT
TAPE PAPER 3X10 WHT MICROPORE (GAUZE/BANDAGES/DRESSINGS) ×3 IMPLANT
TOWEL OR 17X26 10 PK STRL BLUE (TOWEL DISPOSABLE) ×3 IMPLANT
TUBING ARTHROSCOPY IRRIG 16FT (MISCELLANEOUS) ×3 IMPLANT
WATER STERILE IRR 1000ML POUR (IV SOLUTION) ×3 IMPLANT

## 2019-08-16 NOTE — Anesthesia Preprocedure Evaluation (Signed)
Anesthesia Evaluation  Patient identified by MRN, date of birth, ID band Patient awake    Reviewed: Allergy & Precautions, NPO status , Patient's Chart, lab work & pertinent test results  History of Anesthesia Complications (+) PONV and history of anesthetic complications  Airway Mallampati: III       Dental  (+) Dental Advisory Given   Pulmonary shortness of breath, asthma , pneumonia, COPD,  COPD inhaler, Current Smoker and Patient abstained from smoking.,  Hx/o + PPD   Pulmonary exam normal breath sounds clear to auscultation       Cardiovascular + Peripheral Vascular Disease and +CHF  Normal cardiovascular exam Rhythm:Regular Rate:Normal  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 Last Echo 2015 Normal with LVEF 60-65%   Neuro/Psych  Headaches, Seizures -, Well Controlled,  Anxiety CVA    GI/Hepatic Neg liver ROS, PUD, GERD  Medicated and Controlled,  Endo/Other  Hypothyroidism   Renal/GU negative Renal ROS  negative genitourinary   Musculoskeletal  (+) Arthritis ,   Abdominal Normal abdominal exam  (+)   Peds  Hematology  (+) anemia ,   Anesthesia Other Findings   Reproductive/Obstetrics negative OB ROS menorrhagia                            Lab Results  Component Value Date   WBC 4.9 08/08/2019   HGB 14.0 08/08/2019   HCT 41.6 08/08/2019   MCV 99.5 08/08/2019   PLT 281 08/08/2019     Chemistry      Component Value Date/Time   NA 136 12/25/2018 1531   K 3.6 12/25/2018 1531   CL 105 12/25/2018 1531   CO2 21 (L) 12/25/2018 1531   BUN 7 12/25/2018 1531   CREATININE 0.64 12/25/2018 1531      Component Value Date/Time   CALCIUM 8.7 (L) 12/25/2018 1531   ALKPHOS 71 07/03/2018 1415   AST 27 07/03/2018 1415   ALT 36 07/03/2018 1415   BILITOT 0.5 07/03/2018 1415     EKG: normal EKG, normal sinus rhythm, unchanged from previous tracings.  Anesthesia  Physical  Anesthesia Plan  ASA: III  Anesthesia Plan: General   Post-op Pain Management: GA combined w/ Regional for post-op pain   Induction: Intravenous  PONV Risk Score and Plan: 2 and Ondansetron, Dexamethasone, Midazolam and Treatment may vary due to age or medical condition  Airway Management Planned: Oral ETT  Additional Equipment: None  Intra-op Plan:   Post-operative Plan: Extubation in OR  Informed Consent: I have reviewed the patients History and Physical, chart, labs and discussed the procedure including the risks, benefits and alternatives for the proposed anesthesia with the patient or authorized representative who has indicated his/her understanding and acceptance.     Dental advisory given  Plan Discussed with: Anesthesiologist, CRNA and Surgeon  Anesthesia Plan Comments:         Anesthesia Quick Evaluation

## 2019-08-16 NOTE — Discharge Instructions (Signed)
   Lisa Ryan. Supple, M.D., F.A.A.O.S. Orthopaedic Surgery Specializing in Arthroscopic and Reconstructive Surgery of the Shoulder [phone] [address] [place], [place] [address] - Fax [phone]  POST-OP SHOULDER ARTHROSCOPIC ROTATOR CUFF  REPAIR INSTRUCTIONS  1. Call the office at [phone] to schedule your first post-op appointment 7-10 days from the date of your surgery.  2. Leave the steri-strips in place over your incisions when performing dressing changes and showering. You may remove your dressings and begin showering 72 hours from surgery. You can expect drainage that is clear to bloody in nature that occasionally will soak through your dressings. If this occurs go ahead and perform a dressing change. The drainage should lessen daily and when there is no drainage from your incisions feel free to go without a dressing.  3. Wear your sling/immobilizer at all times except to perform the exercises below or to occasionally let your arm dangle by your side to stretch your elbow. You also need to sleep in your sling immobilizer until instructed otherwise.  4. Range of motion to your elbow, wrist, and hand are encouraged 3-5 times daily. Exercise to your hand and fingers helps to reduce swelling you may experience.  5. Utilize the cooling therapy via your unit at home as instructed.  6. You may one-armed drive when safely off of narcotics and muscle relaxants. You may use your hand that is in the sling to support the steering wheel only. However, should it be your right arm that is in the sling it is not to be used for gear shifting in a manual transmission.  7. Pain control following an exparel block  To help control your post-operative pain you received a nerve block  performed with Exparel which is a long acting anesthetic (numbing agent) which can provide pain relief and sensations of numbness (and relief of pain) in the operative shoulder and arm for up to 3 days.  Sometimes it provides mixed relief, meaning you may still have numbness in certain areas of the arm but can still be able to move  parts of that arm, hand, and fingers. We recommend that your prescribed pain medications  be used as needed. We do not feel it is necessary to "pre medicate" and "stay ahead" of pain.  Taking narcotic pain medications when you are not having any pain can lead to unnecessary and potentially dangerous side effects.    8. Pain medications can produce constipation along with their use. If you experience this, the use of an over the counter stool softener or laxative daily is recommended.   9. For additional questions or concerns, please do not hesitate to call the office. If after hours there is an answering service to forward your concerns to the physician on call.  POST-OP EXERCISES  Pendulum Exercises  Perform pendulum exercises while standing and bending at the waist. Support your uninvolved arm on a table or chair and allow your operated arm to hang freely. Make sure to do these exercises passively - not using you shoulder muscle.  Repeat 20 times. Do 3 sessions per day.

## 2019-08-16 NOTE — Transfer of Care (Signed)
Immediate Anesthesia Transfer of Care Note  Patient: Lisa Ryan  Procedure(s) Performed: Right shoulder arthroscopy, subacromial decompression, distal clavicle resection, rotator cuff repair (Right )  Patient Location: PACU  Anesthesia Type:General  Level of Consciousness: awake, alert , oriented and patient cooperative  Airway & Oxygen Therapy: Patient Spontanous Breathing and Patient connected to face mask oxygen  Post-op Assessment: Report given to RN, Post -op Vital signs reviewed and stable and Patient moving all extremities  Post vital signs: Reviewed and stable  Last Vitals:  Vitals Value Taken Time  BP 115/88 08/16/19 1455  Temp    Pulse 89 08/16/19 1459  Resp 18 08/16/19 1459  SpO2 100 % 08/16/19 1459  Vitals shown include unvalidated device data.  Last Pain:  Vitals:   08/16/19 1006  TempSrc:   PainSc: 4       Patients Stated Pain Goal: 4 ([id number])  Complications: No apparent anesthesia complications

## 2019-08-16 NOTE — Anesthesia Postprocedure Evaluation (Signed)
Anesthesia Post Note  Patient: Lisa Ryan  Procedure(s) Performed: Right shoulder arthroscopy, subacromial decompression, distal clavicle resection, rotator cuff repair (Right Shoulder)     Patient location during evaluation: PACU Anesthesia Type: General Level of consciousness: sedated and patient cooperative Pain management: pain level controlled Vital Signs Assessment: post-procedure vital signs reviewed and stable Respiratory status: spontaneous breathing Cardiovascular status: stable Anesthetic complications: no    Last Vitals:  Vitals:   08/16/19 1630 08/16/19 1725  BP: 96/73 97/66  Pulse: 67 63  Resp: 12 14  Temp:    SpO2: 92% 94%    Last Pain:  Vitals:   08/16/19 1630  TempSrc:   PainSc: [place]

## 2019-08-16 NOTE — Op Note (Signed)
08/16/2019  3:04 PM  PATIENT:   Lisa Ryan  44 y.o. female  PRE-OPERATIVE DIAGNOSIS:  Right shoulder impingement, acromioclavicular osteoarthritis, rotator cuff tear  POST-OPERATIVE DIAGNOSIS: Same  PROCEDURE:  1.  Right shoulder examination under anesthesia.  2.  Right shoulder glenohumeral joint diagnostic arthroscopy  3.  Arthroscopic subacromial decompression and bursectomy  4.  Arthroscopic distal clavicle resection  5.  Arthroscopic rotator cuff repair using a double row suture bridge repair construct.    SURGEON:  Marin Shutter M.D.  ASSISTANTS: Jenetta Loges, PA-C  ANESTHESIA:   General endotracheal and interscalene block with Exparel  EBL: Minimal  SPECIMEN: None  Drains: None   PATIENT DISPOSITION:  PACU - hemodynamically stable.    PLAN OF CARE: Discharge to home after PACU  Brief history:  Ms. Hackett has been followed for chronic right shoulder pain with associated impingement syndrome and symptoms that have been refractory to prolonged attempts at conservative management.  Preoperative MRI scan demonstrates a small full-thickness rotator cuff tear.  Due to her persistent pain and failure to respond to conservative management she is brought to the operating this time for planned right shoulder arthroscopy as described below  Preoperatively I counseled Ms. Kinter regarding treatment options as well as the potential risks versus benefits thereof.  Possible surgical complications were reviewed including bleeding, infection, neurovascular injury, persistence of pain, loss of motion, recurrence of rotator cuff tear, and possible need for additional surgery.  She understands, and accepts, and agrees with our planned procedure.  Procedure in detail:  After undergoing routine preop evaluation patient received prophylactic and biotics and interscalene block with Exparel was established in the holding area by the anesthesia department.  Subsequently placed  supine on the operating table and underwent the smooth induction of a general endotracheal anesthesia.  Turned to the left lateral decubitus position on a beanbag and appropriately padded and protected.  Right shoulder demonstrated full motion with no instability patterns.  Right arm then suspended at 70 degrees of abduction with 10 pounds of traction the right shoulder girdle region was sterilely prepped and draped in standard fashion.  Timeout was called.  A posterior portal was established into the glenohumeral joint and an anterior portal established under direct visualization.  Articular surfaces were all excellent condition.  No obvious labral pathology.  Capsular volume within normal limits.  Rotator cuff showed evidence for defect involving the distal insertion of the anterior supraspinatus.  The biceps tendon was stable with no evidence for proximal or distal instability.  Capsular volume within normal limits.  Fluid and cyst were removed from the glenohumeral joint.  She did mention that we did debride the rotator cuff tear from the articular side.  Fluid and instruments were then removed from the glenohumeral joint and the arm was dropped down to 30 degrees of abduction with the arthroscope introduced in the subacromial space of the posterior portal and a direct lateral portal was established into the subacromial space.  Abundant dense bursal tissue multiple adhesions were encountered and these were divided and excised with combination the shaver and the Stryker wand.  The wand was then used to remove the periosteum from the undersurface of the anterior half of the acromion and a subacromial decompression was performed with a bur creating a type I morphology.  A portal was then established directly anterior to the distal clavicle and a distal clavicle resection was performed with a bur.  Care was taken to confirm visualization of the entire circumference of  the distal clavicle to ensure adequate removal  of bone.  We then completed the subacromial/subdeltoid bursectomy.  The rotator cuff was inspected and we did identify the full-thickness defect which we debrided with a shaver and ultimately had a footprint approximately 1.5 cm in width.  The tuberosity was prepared removing soft tissue and abrading the bone to bleeding bed.  The rotator cuff tear was then trimmed back to healthy tissue.  Through a stab wound off the lateral margin of the acromion placed an Arthrex peek suture anchor loaded with a fiber tape and the 4 suture limbs were then shuttled equidistant across the width of the rotator cuff tear using the scorpion suture passer and then through then shuttled into 2 lateral row anchors in an alternating fashion which were used to create a double row repair which nicely compressed the free margin of the rotator cuff can provide greater tuberosity and the overall construct was much to our satisfaction.  Suture limbs were then clipped.  Bursectomy was completed.  Hemostasis was obtained.  Fluid and instruments were removed.  The portals are closed with Monocryl and a Steri-Strip.  A dry dressing taped about the right shoulder right arm was placed in a sling immobilizer and patient was awakened, extubated, and taken to the recovery room in stable condition.  Jenetta Loges, PA-C was used as an Environmental consultant throughout this case essential for help with positioning the patient, positioning extremity, tissue manipulation, suture management, wound closure, and Intra-Op decision-making.  In  Lennette Bihari Joelys Staubs MD   Contact # [phone]

## 2019-08-16 NOTE — Progress Notes (Signed)
Assisted Dr. Germeroth with right, ultrasound guided, interscalene  block. Side rails up, monitors on throughout procedure. See vital signs in flow sheet. Tolerated Procedure well.  

## 2019-08-16 NOTE — H&P (Signed)
Lisa Ryan    Chief Complaint: Right shoulder impingement, acromioclavicular osteoarthritis, rotator cuff tear HPI: The patient is a 44 y.o. female with chronic right shoulder pain and increasing functional rotations that have been refractory to prolonged attempts at conservative management related to impingement syndrome and rotator cuff tear.  Due to her persistent difficulties she is brought to the operating this time for planned right shoulder arthroscopy with anticipated rotator cuff repair.  Past Medical History:  Diagnosis Date  . Anemia   . Anxiety state, unspecified 10/12/2007  . Arthritis   . Asthma   . CHEST PAIN, ATYPICAL 02/18/2010   after using albuterol  . CHEST PAIN-PRECORDIAL 02/19/2010   after albuterol  . CHF (congestive heart failure) ([place]) 1999   During Pregnancy, After C section  . COPD (chronic obstructive pulmonary disease) ([place])   . Diverticulosis   . FATIGUE 09/28/2007  . Fatty liver   . GASTROENTERITIS 10/03/2008  . GERD (gastroesophageal reflux disease)    occ takes OTC ttums  . History of vitamin D deficiency   . Hypothyroidism   . MIGRAINE HEADACHE 03/04/2010  . Obesity   . PEDAL EDEMA 02/18/2010   comes and goes   . PEPTIC ULCER DISEASE 09/28/2007  . 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 Pneumonia   . PONV (postoperative nausea and vomiting)   . SEIZURE DISORDER 09/28/2007  . SUI (stress urinary incontinence, female)   . Thyroid nodule   . Tobacco abuse   . WEIGHT GAIN 09/28/2007    Past Surgical History:  Procedure Laterality Date  . ANKLE SURGERY     left  . APPENDECTOMY    . CARDIAC CATHETERIZATION    . CESAREAN SECTION     x3  . CHOLECYSTECTOMY    . DILITATION & CURRETTAGE/HYSTROSCOPY WITH NOVASURE ABLATION N/A 12/26/2015   Procedure: DILATATION & CURETTAGE/HYSTEROSCOPY WITH ENDOMETRIAL ABLATION;  Surgeon: Jonnie Kind, MD;  Location: [facility];  Service: Gynecology;  Laterality: N/A;  .  ESOPHAGOGASTRODUODENOSCOPY  2012  . KNEE ARTHROSCOPY Left    x2  . MOUTH LESION EXCISIONAL BIOPSY  04/2011   pre-cancerous, pallet of mouth    Family History  Problem Relation Age of Onset  . Cerebral aneurysm Mother   . Colon cancer Mother   . Heart disease Father   . Liver disease Father   . Diabetes Brother 1       died age 18  . Kidney disease Maternal Grandmother   . Colon cancer Maternal Grandmother   . Colon cancer Maternal Aunt   . Diabetes Sister     Social History:  reports that she has been smoking cigarettes. She has a 20.00 pack-year smoking history. She has never used smokeless tobacco. She reports previous alcohol use. She reports that she does not use drugs.   Medications Prior to Admission  Medication Sig Dispense Refill  . DULoxetine (CYMBALTA) 30 MG capsule TAKE 3 CAPSULES BY MOUTH EVERY DAY AT BEDTIME (Patient taking differently: Take 60 mg by mouth at bedtime. ) 90 capsule 3  . levalbuterol (XOPENEX HFA) 45 MCG/ACT inhaler Inhale 1-2 puffs into the lungs daily as needed for wheezing or shortness of breath.    . NP THYROID 30 MG tablet TAKE 1 TABLET BY MOUTH IN THE MID AFTERNOON (Patient taking differently: Take 60 mg by mouth daily. ) 30 tablet 3  . progesterone (PROMETRIUM) 200 MG capsule TAKE 2 CAPSULES(400 MG) BY MOUTH AT BEDTIME 60 capsule 3  . ipratropium-albuterol (DUONEB) 0.5-2.5 (3) MG/3ML  SOLN Take 3 mLs by nebulization every 6 (six) hours as needed. 360 mL 0  . naproxen sodium (ALEVE) 220 MG tablet Take 660 mg by mouth daily as needed (pain).    Marland Kitchen oxyCODONE-acetaminophen (PERCOCET/ROXICET) 5-325 MG tablet Take 2 tablets by mouth every 4 (four) hours as needed for severe pain. (Patient not taking: Reported on 12/26/2018) 15 tablet 0  . promethazine (PHENERGAN) 25 MG tablet Take 1 tablet (25 mg total) by mouth every 6 (six) hours as needed for nausea or vomiting. (Patient not taking: Reported on 12/26/2018) 30 tablet 0  . varenicline (CHANTIX) 0.5 MG  tablet Take 1 tablet (0.5 mg total) by mouth 2 (two) times daily. (Patient not taking: Reported on 07/03/2018) 30 tablet 1     Physical Exam: Right shoulder demonstrates painful and guarded motion as noted at recent office visits.  Markedly positive impingement sign.  Pain with manual muscle testing.  Preoperative MRI scan confirms a full-thickness rotator cuff tear as well as severe bony impingement and moderate AC joint arthritis.  Vitals  Temp:  [98.2 F (36.8 C)] 98.2 F (36.8 C) (04/15 0951) Pulse Rate:  [65-93] 67 (04/15 1222) Resp:  [12-23] 16 (04/15 1222) BP: (113-118)/(76-88) 117/77 (04/15 1222) SpO2:  [93 %-100 %] 96 % (04/15 1222) Weight:  [81.4 kg] 81.4 kg (04/15 1006)  Assessment/Plan  Impression: Right shoulder impingement, acromioclavicular osteoarthritis, rotator cuff tear  Plan of Action: Procedure(s): Right shoulder arthroscopy, subacromial decompression, distal clavicle resection, rotator cuff repair  Felix Meras M Ifeoma Vallin 08/16/2019, 12:36 PM Contact # [phone]

## 2019-08-16 NOTE — Anesthesia Procedure Notes (Signed)
Procedure Name: Intubation Date/Time: 08/16/2019 1:20 PM Performed by: Mitzie Na, CRNA Pre-anesthesia Checklist: Patient identified, Emergency Drugs available, Suction available and Patient being monitored Patient Re-evaluated:Patient Re-evaluated prior to induction Oxygen Delivery Method: Circle system utilized Preoxygenation: Pre-oxygenation with 100% oxygen Induction Type: IV induction Ventilation: Mask ventilation without difficulty Laryngoscope Size: Mac and 3 Grade View: Grade I Tube type: Oral Tube size: 7.0 mm Number of attempts: 1 Airway Equipment and Method: Stylet Placement Confirmation: ETT inserted through vocal cords under direct vision,  positive ETCO2 and breath sounds checked- equal and bilateral Secured at: 23 cm Tube secured with: Tape Dental Injury: Teeth and Oropharynx as per pre-operative assessment

## 2019-08-17 ENCOUNTER — Encounter: Payer: Self-pay | Admitting: *Deleted

## 2019-09-20 ENCOUNTER — Other Ambulatory Visit (INDEPENDENT_AMBULATORY_CARE_PROVIDER_SITE_OTHER): Payer: Self-pay | Admitting: Internal Medicine

## 2019-09-27 NOTE — Patient Instructions (Addendum)
DUE TO COVID-19 ONLY ONE VISITOR IS ALLOWED TO COME WITH YOU AND STAY IN THE WAITING ROOM ONLY DURING PRE OP AND PROCEDURE DAY OF SURGERY. THE 2 VISITORS  MAY VISIT WITH YOU AFTER SURGERY IN YOUR PRIVATE ROOM DURING VISITING HOURS ONLY!  YOU NEED TO HAVE A COVID 19 TEST ON_5/28____ @_2 :10____, THIS TEST MUST BE DONE BEFORE SURGERY, COME  [address], [place] , [address].  ([place]) ONCE YOUR COVID TEST IS COMPLETED, PLEASE BEGIN THE QUARANTINE INSTRUCTIONS AS OUTLINED IN YOUR HANDOUT.                Lisa Ryan    Your procedure is scheduled on: 10/02/19   Report to [facility] Main  Entrance   Report to admitting at 10:00 am     Call this number if you have problems the morning of surgery [phone]    Remember: Do not eat food after Midnight.  Clear liquids until 9:00 AM   CLEAR LIQUID DIET   Foods Allowed                                                                     Foods Excluded  Coffee and tea, regular and decaf                             liquids that you cannot  Plain Jell-O any favor except red or purple                                           see through such as: Fruit ices (not with fruit pulp)                                     milk, soups, orange juice  Iced Popsicles                                    All solid food Carbonated beverages, regular and diet                                    Cranberry, grape and apple juices Sports drinks like Gatorade Lightly seasoned clear broth or consume(fat free) Sugar, honey syrup   At 9:00 AM Please finish the prescribed Pre-Surgery  drink.   Nothing by mouth after you finish the drink !     BRUSH YOUR TEETH MORNING OF SURGERY AND RINSE YOUR MOUTH OUT, NO CHEWING GUM CANDY OR MINTS.     Take these medicines the morning of surgery with A SIP OF WATER: NP Thyroid, use your inhalers and bring them with you to the hospital                                You may not have  any metal on  your body including hair pins and              piercings  Do not wear jewelry, make-up, lotions, powders or perfumes, deodorant             Do not wear nail polish on your fingernails.  Do not shave  48 hours prior to surgery.             Do not bring valuables to the hospital. [place].  Contacts, dentures or bridgework may not be worn into surgery.       Patients discharged the day of surgery will not be allowed to drive home.   IF YOU ARE HAVING SURGERY AND GOING HOME THE SAME DAY, YOU MUST HAVE AN ADULT TO DRIVE YOU HOME AND BE WITH YOU FOR 24 HOURS  . YOU MAY GO HOME BY TAXI OR UBER OR ORTHERWISE, BUT AN ADULT MUST ACCOMPANY YOU HOME AND STAY WITH YOU FOR 24 HOURS.  Name and phone number of your driver:  Special Instructions: N/A              Please read over the following fact sheets you were given: _____________________________________________________________________             [facility] - Preparing for Surgery Before surgery, you can play an important role.   Because skin is not sterile, your skin needs to be as free of germs as possible.   You can reduce the number of germs on your skin by washing with CHG (chlorahexidine gluconate) soap before surgery.   CHG is an antiseptic cleaner which kills germs and bonds with the skin to continue killing germs even after washing. Please DO NOT use if you have an allergy to CHG or antibacterial soaps.   If your skin becomes reddened/irritated stop using the CHG and inform your nurse when you arrive at Short Stay. Do not shave (including legs and underarms) for at least 48 hours prior to the first CHG shower.    Please follow these instructions carefully:  1.  Shower with CHG Soap the night before surgery and the  morning of Surgery.  2.  If you choose to wash your hair, wash your hair first as usual with your  normal  shampoo.  3.  After you shampoo, rinse your hair  and body thoroughly to remove the  shampoo.                                        4.  Use CHG as you would any other liquid soap.  You can apply chg directly  to the skin and wash                       Gently with a scrungie or clean washcloth.  5.  Apply the CHG Soap to your body ONLY FROM THE NECK DOWN.   Do not use on face/ open                           Wound or open sores. Avoid contact with eyes, ears mouth and genitals (private parts).  Wash face,  Genitals (private parts) with your normal soap.             6.  Wash thoroughly, paying special attention to the area where your surgery  will be performed.  7.  Thoroughly rinse your body with warm water from the neck down.  8.  DO NOT shower/wash with your normal soap after using and rinsing off  the CHG Soap.             9.  Pat yourself dry with a clean towel.            10.  Wear clean pajamas.            11.  Place clean sheets on your bed the night of your first shower and do not  sleep with pets. Day of Surgery : Do not apply any lotions/deodorants the morning of surgery.  Please wear clean clothes to the hospital/surgery center.  FAILURE TO FOLLOW THESE INSTRUCTIONS MAY RESULT IN THE CANCELLATION OF YOUR SURGERY PATIENT SIGNATURE_________________________________  NURSE SIGNATURE__________________________________  ________________________________________________________________________

## 2019-09-28 ENCOUNTER — Encounter (HOSPITAL_COMMUNITY)
Admission: RE | Admit: 2019-09-28 | Discharge: 2019-09-28 | Disposition: A | Discharge status: Home / Self Care | Payer: 59 | Source: Ambulatory Visit | Attending: Orthopedic Surgery | Admitting: Orthopedic Surgery

## 2019-09-28 ENCOUNTER — Other Ambulatory Visit: Payer: Self-pay

## 2019-09-28 ENCOUNTER — Other Ambulatory Visit (HOSPITAL_COMMUNITY)
Admission: RE | Admit: 2019-09-28 | Discharge: 2019-09-28 | Disposition: A | Discharge status: Home / Self Care | Payer: 59 | Source: Ambulatory Visit | Attending: Orthopedic Surgery | Admitting: Orthopedic Surgery

## 2019-09-28 ENCOUNTER — Encounter (HOSPITAL_COMMUNITY): Payer: Self-pay

## 2019-09-28 DIAGNOSIS — Z20822 Contact with and (suspected) exposure to covid-19: Secondary | ICD-10-CM | POA: Insufficient documentation

## 2019-09-28 DIAGNOSIS — Z01812 Encounter for preprocedural laboratory examination: Secondary | ICD-10-CM | POA: Diagnosis present

## 2019-09-28 HISTORY — DX: Angina pectoris, unspecified: I20.9

## 2019-09-28 LAB — CBC
HCT: 39.9 % (ref 36.0–46.0)
Hemoglobin: 13.6 g/dL (ref 12.0–15.0)
MCH: 34 pg (ref 26.0–34.0)
MCHC: 34.1 g/dL (ref 30.0–36.0)
MCV: 99.8 fL (ref 80.0–100.0)
Platelets: 317 10*3/uL (ref 150–400)
RBC: 4 MIL/uL (ref 3.87–5.11)
RDW: 12.2 % (ref 11.5–15.5)
WBC: 9.8 10*3/uL (ref 4.0–10.5)
nRBC: 0 % (ref 0.0–0.2)

## 2019-09-28 LAB — ABO/RH: ABO/RH(D): O POS

## 2019-09-28 NOTE — Progress Notes (Signed)
COVID Vaccine Completed:Yes Date COVID Vaccine completed:08/18/19 COVID vaccine manufacturer:    Moderna  PCP - Dr. Clare Gandy Cardiologist - no  Chest x-ray - 12/25/18 EKG - 12/26/18 Stress Test -  ECHO - 2015 Cardiac Cath - 2015 Pt not sure  Sleep Study -no  CPAP -   Fasting Blood Sugar - NA Checks Blood Sugar _____ times a day  Blood Thinner Instructions:NA Aspirin Instructions: Last Dose:  Anesthesia review:   Patient denies shortness of breath, fever, cough and chest pain at PAT appointment yes  Patient verbalized understanding of instructions that were given to them at the PAT appointment. Patient was also instructed that they will need to review over the PAT instructions again at home before surgery. yes

## 2019-09-28 NOTE — Progress Notes (Signed)
Pt aware to arrive at [facility] admitting dept at 0930 on 10/02/2019 for scheduled surgical procedure.

## 2019-09-29 LAB — SARS CORONAVIRUS 2 (TAT 6-24 HRS): SARS Coronavirus 2: NEGATIVE

## 2019-10-02 ENCOUNTER — Ambulatory Visit (HOSPITAL_COMMUNITY): Payer: 59 | Admitting: Certified Registered Nurse Anesthetist

## 2019-10-02 ENCOUNTER — Observation Stay (HOSPITAL_COMMUNITY)
Admission: RE | Admit: 2019-10-02 | Discharge: 2019-10-03 | Disposition: A | Discharge status: Home / Self Care | Payer: 59 | Attending: Orthopedic Surgery | Admitting: Orthopedic Surgery

## 2019-10-02 ENCOUNTER — Encounter (HOSPITAL_COMMUNITY)
Admission: RE | Disposition: A | Discharge status: Home / Self Care | Payer: Self-pay | Source: Home / Self Care | Attending: Orthopedic Surgery

## 2019-10-02 ENCOUNTER — Other Ambulatory Visit (HOSPITAL_COMMUNITY): Payer: 59

## 2019-10-02 ENCOUNTER — Other Ambulatory Visit: Payer: Self-pay

## 2019-10-02 ENCOUNTER — Encounter (HOSPITAL_COMMUNITY): Payer: Self-pay | Admitting: Orthopedic Surgery

## 2019-10-02 ENCOUNTER — Encounter (HOSPITAL_COMMUNITY): Payer: 59

## 2019-10-02 DIAGNOSIS — J449 Chronic obstructive pulmonary disease, unspecified: Secondary | ICD-10-CM | POA: Diagnosis not present

## 2019-10-02 DIAGNOSIS — K219 Gastro-esophageal reflux disease without esophagitis: Secondary | ICD-10-CM | POA: Insufficient documentation

## 2019-10-02 DIAGNOSIS — M7501 Adhesive capsulitis of right shoulder: Secondary | ICD-10-CM | POA: Diagnosis not present

## 2019-10-02 DIAGNOSIS — K760 Fatty (change of) liver, not elsewhere classified: Secondary | ICD-10-CM | POA: Insufficient documentation

## 2019-10-02 DIAGNOSIS — G40909 Epilepsy, unspecified, not intractable, without status epilepticus: Secondary | ICD-10-CM | POA: Diagnosis not present

## 2019-10-02 DIAGNOSIS — S46011A Strain of muscle(s) and tendon(s) of the rotator cuff of right shoulder, initial encounter: Secondary | ICD-10-CM | POA: Diagnosis not present

## 2019-10-02 DIAGNOSIS — E669 Obesity, unspecified: Secondary | ICD-10-CM | POA: Insufficient documentation

## 2019-10-02 DIAGNOSIS — Z9889 Other specified postprocedural states: Secondary | ICD-10-CM | POA: Diagnosis not present

## 2019-10-02 DIAGNOSIS — M65811 Other synovitis and tenosynovitis, right shoulder: Secondary | ICD-10-CM | POA: Diagnosis not present

## 2019-10-02 DIAGNOSIS — X509XXA Other and unspecified overexertion or strenuous movements or postures, initial encounter: Secondary | ICD-10-CM | POA: Insufficient documentation

## 2019-10-02 DIAGNOSIS — M1990 Unspecified osteoarthritis, unspecified site: Secondary | ICD-10-CM | POA: Diagnosis not present

## 2019-10-02 DIAGNOSIS — E039 Hypothyroidism, unspecified: Secondary | ICD-10-CM | POA: Diagnosis not present

## 2019-10-02 DIAGNOSIS — Z8711 Personal history of peptic ulcer disease: Secondary | ICD-10-CM | POA: Insufficient documentation

## 2019-10-02 DIAGNOSIS — Z79899 Other long term (current) drug therapy: Secondary | ICD-10-CM | POA: Insufficient documentation

## 2019-10-02 DIAGNOSIS — Z6829 Body mass index (BMI) 29.0-29.9, adult: Secondary | ICD-10-CM | POA: Diagnosis not present

## 2019-10-02 DIAGNOSIS — Z79890 Hormone replacement therapy (postmenopausal): Secondary | ICD-10-CM | POA: Insufficient documentation

## 2019-10-02 DIAGNOSIS — F17210 Nicotine dependence, cigarettes, uncomplicated: Secondary | ICD-10-CM | POA: Insufficient documentation

## 2019-10-02 DIAGNOSIS — Z791 Long term (current) use of non-steroidal anti-inflammatories (NSAID): Secondary | ICD-10-CM | POA: Insufficient documentation

## 2019-10-02 DIAGNOSIS — Z885 Allergy status to narcotic agent status: Secondary | ICD-10-CM | POA: Diagnosis not present

## 2019-10-02 DIAGNOSIS — Z888 Allergy status to other drugs, medicaments and biological substances status: Secondary | ICD-10-CM | POA: Diagnosis not present

## 2019-10-02 HISTORY — PX: SHOULDER ARTHROSCOPY WITH ROTATOR CUFF REPAIR: SHX5685

## 2019-10-02 LAB — TYPE AND SCREEN
ABO/RH(D): O POS
Antibody Screen: NEGATIVE

## 2019-10-02 SURGERY — ARTHROSCOPY, SHOULDER, WITH ROTATOR CUFF REPAIR
Anesthesia: General | Site: Shoulder | Laterality: Right

## 2019-10-02 MED ORDER — ONDANSETRON HCL 4 MG/2ML IJ SOLN
INTRAMUSCULAR | Status: AC
  Filled 2019-10-02: qty 2

## 2019-10-02 MED ORDER — METHOCARBAMOL 1000 MG/10ML IJ SOLN
500.0000 mg | Freq: Four times a day (QID) | INTRAVENOUS | Status: DC | PRN
  Filled 2019-10-02: qty 5

## 2019-10-02 MED ORDER — PROPOFOL 10 MG/ML IV BOLUS
INTRAVENOUS | Status: DC | PRN
  Administered 2019-10-02: 200 mg via INTRAVENOUS

## 2019-10-02 MED ORDER — DEXAMETHASONE SODIUM PHOSPHATE 10 MG/ML IJ SOLN
INTRAMUSCULAR | Status: AC
  Filled 2019-10-02: qty 1

## 2019-10-02 MED ORDER — CEFAZOLIN SODIUM-DEXTROSE 2-4 GM/100ML-% IV SOLN
2.0000 g | INTRAVENOUS | Status: AC
  Administered 2019-10-02: 2 g via INTRAVENOUS
  Filled 2019-10-02: qty 100

## 2019-10-02 MED ORDER — MIDAZOLAM HCL 2 MG/2ML IJ SOLN
INTRAMUSCULAR | Status: AC
  Administered 2019-10-02: 2 mg via INTRAVENOUS
  Filled 2019-10-02: qty 2

## 2019-10-02 MED ORDER — DIPHENHYDRAMINE HCL 12.5 MG/5ML PO ELIX: 12.5000 mg | ORAL_SOLUTION | ORAL | Status: DC | PRN

## 2019-10-02 MED ORDER — ONDANSETRON HCL 4 MG/2ML IJ SOLN
4.0000 mg | Freq: Four times a day (QID) | INTRAMUSCULAR | Status: DC | PRN
  Administered 2019-10-03: 4 mg via INTRAVENOUS
  Filled 2019-10-02: qty 2

## 2019-10-02 MED ORDER — SUGAMMADEX SODIUM 200 MG/2ML IV SOLN
INTRAVENOUS | Status: DC | PRN
  Administered 2019-10-02: 300 mg via INTRAVENOUS

## 2019-10-02 MED ORDER — MIDAZOLAM HCL 2 MG/2ML IJ SOLN
1.0000 mg | INTRAMUSCULAR | Status: DC
  Filled 2019-10-02: qty 2

## 2019-10-02 MED ORDER — OXYCODONE HCL 5 MG PO TABS
5.0000 mg | ORAL_TABLET | ORAL | Status: DC | PRN
  Administered 2019-10-03: 5 mg via ORAL
  Filled 2019-10-02: qty 1

## 2019-10-02 MED ORDER — IPRATROPIUM-ALBUTEROL 0.5-2.5 (3) MG/3ML IN SOLN: 3.0000 mL | Freq: Four times a day (QID) | RESPIRATORY_TRACT | Status: DC | PRN

## 2019-10-02 MED ORDER — SCOPOLAMINE 1 MG/3DAYS TD PT72
MEDICATED_PATCH | TRANSDERMAL | Status: AC
  Administered 2019-10-02: 1.5 mg via TRANSDERMAL
  Filled 2019-10-02: qty 1

## 2019-10-02 MED ORDER — HYDROMORPHONE HCL 1 MG/ML IJ SOLN: 0.2500 mg | INTRAMUSCULAR | Status: DC | PRN

## 2019-10-02 MED ORDER — METOCLOPRAMIDE HCL 5 MG PO TABS
5.0000 mg | ORAL_TABLET | Freq: Three times a day (TID) | ORAL | Status: DC | PRN
  Administered 2019-10-03: 10 mg via ORAL
  Filled 2019-10-02: qty 2

## 2019-10-02 MED ORDER — OXYCODONE HCL 5 MG PO TABS
10.0000 mg | ORAL_TABLET | ORAL | Status: DC | PRN
  Administered 2019-10-03: 10 mg via ORAL
  Filled 2019-10-02: qty 2

## 2019-10-02 MED ORDER — PROGESTERONE 200 MG PO CAPS
400.0000 mg | ORAL_CAPSULE | Freq: Every day | ORAL | Status: DC
  Administered 2019-10-02: 400 mg via ORAL
  Filled 2019-10-02: qty 2

## 2019-10-02 MED ORDER — MENTHOL 3 MG MT LOZG: 1.0000 | LOZENGE | OROMUCOSAL | Status: DC | PRN

## 2019-10-02 MED ORDER — FLEET ENEMA 7-19 GM/118ML RE ENEM: 1.0000 | ENEMA | Freq: Once | RECTAL | Status: DC | PRN

## 2019-10-02 MED ORDER — LACTATED RINGERS IV SOLN: INTRAVENOUS | Status: DC

## 2019-10-02 MED ORDER — ALUM & MAG HYDROXIDE-SIMETH 200-200-20 MG/5ML PO SUSP: 30.0000 mL | ORAL | Status: DC | PRN

## 2019-10-02 MED ORDER — POLYETHYLENE GLYCOL 3350 17 G PO PACK: 17.0000 g | PACK | Freq: Every day | ORAL | Status: DC | PRN

## 2019-10-02 MED ORDER — THYROID 60 MG PO TABS
60.0000 mg | ORAL_TABLET | Freq: Every day | ORAL | Status: DC
  Administered 2019-10-03: 60 mg via ORAL
  Filled 2019-10-02 (×3): qty 1

## 2019-10-02 MED ORDER — ORAL CARE MOUTH RINSE: 15.0000 mL | Freq: Once | OROMUCOSAL | Status: AC

## 2019-10-02 MED ORDER — MEPERIDINE HCL 50 MG/ML IJ SOLN: 6.2500 mg | INTRAMUSCULAR | Status: DC | PRN

## 2019-10-02 MED ORDER — PANTOPRAZOLE SODIUM 40 MG PO TBEC
40.0000 mg | DELAYED_RELEASE_TABLET | Freq: Every day | ORAL | Status: DC
  Administered 2019-10-02 – 2019-10-03 (×2): 40 mg via ORAL
  Filled 2019-10-02 (×2): qty 1

## 2019-10-02 MED ORDER — ONDANSETRON HCL 4 MG/2ML IJ SOLN
4.0000 mg | Freq: Once | INTRAMUSCULAR | Status: AC | PRN
  Administered 2019-10-02: 4 mg via INTRAVENOUS

## 2019-10-02 MED ORDER — ONDANSETRON HCL 4 MG/2ML IJ SOLN
INTRAMUSCULAR | Status: DC | PRN
  Administered 2019-10-02: 4 mg via INTRAVENOUS

## 2019-10-02 MED ORDER — BISACODYL 5 MG PO TBEC: 5.0000 mg | DELAYED_RELEASE_TABLET | Freq: Every day | ORAL | Status: DC | PRN

## 2019-10-02 MED ORDER — METHOCARBAMOL 500 MG PO TABS
500.0000 mg | ORAL_TABLET | Freq: Four times a day (QID) | ORAL | Status: DC | PRN
  Administered 2019-10-02 – 2019-10-03 (×2): 500 mg via ORAL
  Filled 2019-10-02 (×2): qty 1

## 2019-10-02 MED ORDER — KETOROLAC TROMETHAMINE 15 MG/ML IJ SOLN
15.0000 mg | Freq: Four times a day (QID) | INTRAMUSCULAR | Status: DC
  Administered 2019-10-02 – 2019-10-03 (×3): 15 mg via INTRAVENOUS
  Filled 2019-10-02 (×3): qty 1

## 2019-10-02 MED ORDER — FENTANYL CITRATE (PF) 100 MCG/2ML IJ SOLN: 50.0000 ug | INTRAMUSCULAR | Status: DC

## 2019-10-02 MED ORDER — SCOPOLAMINE 1 MG/3DAYS TD PT72: 1.0000 | MEDICATED_PATCH | TRANSDERMAL | Status: DC

## 2019-10-02 MED ORDER — ROCURONIUM BROMIDE 10 MG/ML (PF) SYRINGE
PREFILLED_SYRINGE | INTRAVENOUS | Status: DC | PRN
  Administered 2019-10-02: 70 mg via INTRAVENOUS
  Administered 2019-10-02: 10 mg via INTRAVENOUS

## 2019-10-02 MED ORDER — LEVALBUTEROL HCL 0.63 MG/3ML IN NEBU: 0.6300 mg | INHALATION_SOLUTION | Freq: Every day | RESPIRATORY_TRACT | Status: DC | PRN

## 2019-10-02 MED ORDER — ROCURONIUM BROMIDE 10 MG/ML (PF) SYRINGE
PREFILLED_SYRINGE | INTRAVENOUS | Status: AC
  Filled 2019-10-02: qty 10

## 2019-10-02 MED ORDER — TRAMADOL HCL 50 MG PO TABS: 50.0000 mg | ORAL_TABLET | Freq: Four times a day (QID) | ORAL | Status: DC | PRN

## 2019-10-02 MED ORDER — DULOXETINE HCL 60 MG PO CPEP
60.0000 mg | ORAL_CAPSULE | Freq: Every day | ORAL | Status: DC
  Administered 2019-10-02: 60 mg via ORAL
  Filled 2019-10-02: qty 1

## 2019-10-02 MED ORDER — PHENOL 1.4 % MT LIQD: 1.0000 | OROMUCOSAL | Status: DC | PRN

## 2019-10-02 MED ORDER — DOCUSATE SODIUM 100 MG PO CAPS
100.0000 mg | ORAL_CAPSULE | Freq: Two times a day (BID) | ORAL | Status: DC
  Administered 2019-10-02 – 2019-10-03 (×2): 100 mg via ORAL
  Filled 2019-10-02 (×2): qty 1

## 2019-10-02 MED ORDER — PROPOFOL 10 MG/ML IV BOLUS
INTRAVENOUS | Status: AC
  Filled 2019-10-02: qty 20

## 2019-10-02 MED ORDER — SODIUM CHLORIDE 0.9 % IR SOLN
Status: DC | PRN
  Administered 2019-10-02 (×5): 3000 mL

## 2019-10-02 MED ORDER — METOCLOPRAMIDE HCL 5 MG/ML IJ SOLN: 5.0000 mg | Freq: Three times a day (TID) | INTRAMUSCULAR | Status: DC | PRN

## 2019-10-02 MED ORDER — FENTANYL CITRATE (PF) 100 MCG/2ML IJ SOLN
INTRAMUSCULAR | Status: DC | PRN
  Administered 2019-10-02: 50 ug via INTRAVENOUS
  Administered 2019-10-02: 100 ug via INTRAVENOUS
  Administered 2019-10-02: 50 ug via INTRAVENOUS

## 2019-10-02 MED ORDER — DEXAMETHASONE SODIUM PHOSPHATE 10 MG/ML IJ SOLN
INTRAMUSCULAR | Status: DC | PRN
  Administered 2019-10-02: 10 mg via INTRAVENOUS

## 2019-10-02 MED ORDER — ONDANSETRON HCL 4 MG PO TABS: 4.0000 mg | ORAL_TABLET | Freq: Four times a day (QID) | ORAL | Status: DC | PRN

## 2019-10-02 MED ORDER — LIDOCAINE 2% (20 MG/ML) 5 ML SYRINGE
INTRAMUSCULAR | Status: AC
  Filled 2019-10-02: qty 5

## 2019-10-02 MED ORDER — CHLORHEXIDINE GLUCONATE 0.12 % MT SOLN
15.0000 mL | Freq: Once | OROMUCOSAL | Status: AC
  Administered 2019-10-02: 15 mL via OROMUCOSAL

## 2019-10-02 MED ORDER — FENTANYL CITRATE (PF) 100 MCG/2ML IJ SOLN
INTRAMUSCULAR | Status: AC
  Filled 2019-10-02: qty 2

## 2019-10-02 MED ORDER — ACETAMINOPHEN 325 MG PO TABS: 325.0000 mg | ORAL_TABLET | Freq: Four times a day (QID) | ORAL | Status: DC | PRN

## 2019-10-02 MED ORDER — HYDROMORPHONE HCL 1 MG/ML IJ SOLN
0.5000 mg | INTRAMUSCULAR | Status: DC | PRN
  Administered 2019-10-03 (×2): 0.5 mg via INTRAVENOUS
  Filled 2019-10-02: qty 1

## 2019-10-02 MED ORDER — FENTANYL CITRATE (PF) 100 MCG/2ML IJ SOLN
INTRAMUSCULAR | Status: AC
  Administered 2019-10-02: 100 ug via INTRAVENOUS
  Filled 2019-10-02: qty 2

## 2019-10-02 SURGICAL SUPPLY — 66 items
ANCHOR PEEK 4.75X19.1 SWLK C (Anchor) ×4 IMPLANT
ANCHOR PEEK SWIVEL LOCK 5.5 (Anchor) ×4 IMPLANT
BLADE EXCALIBUR 4.0MM X 13CM (MISCELLANEOUS) ×1
BLADE EXCALIBUR 4.0X13 (MISCELLANEOUS) ×2 IMPLANT
BOOTIES KNEE HIGH SLOAN (MISCELLANEOUS) ×6 IMPLANT
BURR OVAL 8 FLU 4.0MM X 13CM (MISCELLANEOUS) ×1
BURR OVAL 8 FLU 4.0X13 (MISCELLANEOUS) ×2 IMPLANT
BURR OVAL 8 FLU 5.0MM X 13CM (MISCELLANEOUS)
BURR OVAL 8 FLU 5.0X13 (MISCELLANEOUS) ×1 IMPLANT
CANNULA ACUFLEX KIT 5X76 (CANNULA) ×3 IMPLANT
CANNULA DRILOCK 5.0MMX75MM (CANNULA) ×1
CANNULA DRILOCK 5.0X75 (CANNULA) ×1 IMPLANT
CANNULA TWIST IN 8.25X7CM (CANNULA) ×2 IMPLANT
CLOSURE STERI-STRIP 1/2X4 (GAUZE/BANDAGES/DRESSINGS) ×1
CLOSURE WOUND 1/2 X4 (GAUZE/BANDAGES/DRESSINGS) ×1
CLSR STERI-STRIP ANTIMIC 1/2X4 (GAUZE/BANDAGES/DRESSINGS) ×1 IMPLANT
CONNECTOR 5 IN 1 STRAIGHT STRL (MISCELLANEOUS) ×3 IMPLANT
COOLER ICEMAN CLASSIC (MISCELLANEOUS) ×2 IMPLANT
COVER WAND RF STERILE (DRAPES) ×3 IMPLANT
DISSECTOR  3.8MM X 13CM (MISCELLANEOUS) ×3
DISSECTOR 3.8MM X 13CM (MISCELLANEOUS) ×1 IMPLANT
DRAPE INCISE 23X17 IOBAN STRL (DRAPES) ×2
DRAPE INCISE 23X17 STRL (DRAPES) ×1 IMPLANT
DRAPE INCISE IOBAN 23X17 STRL (DRAPES) ×1 IMPLANT
DRAPE INCISE IOBAN 66X45 STRL (DRAPES) ×3 IMPLANT
DRAPE ORTHO SPLIT 77X108 STRL (DRAPES) ×6
DRAPE STERI 35X30 U-POUCH (DRAPES) ×3 IMPLANT
DRAPE SURG 17X11 SM STRL (DRAPES) ×3 IMPLANT
DRAPE SURG ORHT 6 SPLT 77X108 (DRAPES) ×2 IMPLANT
DRAPE U-SHAPE 47X51 STRL (DRAPES) ×3 IMPLANT
DRSG PAD ABDOMINAL 8X10 ST (GAUZE/BANDAGES/DRESSINGS) ×4 IMPLANT
DURAPREP 26ML APPLICATOR (WOUND CARE) ×3 IMPLANT
GAUZE SPONGE 4X4 12PLY STRL (GAUZE/BANDAGES/DRESSINGS) ×3 IMPLANT
GLOVE BIO SURGEON STRL SZ7.5 (GLOVE) ×3 IMPLANT
GLOVE BIO SURGEON STRL SZ8 (GLOVE) ×3 IMPLANT
GLOVE SS BIOGEL STRL SZ 7 (GLOVE) ×2 IMPLANT
GLOVE SS BIOGEL STRL SZ 7.5 (GLOVE) ×1 IMPLANT
GLOVE SUPERSENSE BIOGEL SZ 7 (GLOVE) ×4
GLOVE SUPERSENSE BIOGEL SZ 7.5 (GLOVE) ×2
GOWN STRL REUS W/TWL LRG LVL3 (GOWN DISPOSABLE) ×6 IMPLANT
KIT BASIN (CUSTOM PROCEDURE TRAY) ×3 IMPLANT
KIT SHOULDER TRACTION (DRAPES) ×3 IMPLANT
KIT TURNOVER KIT A (KITS) IMPLANT
MANIFOLD NEPTUNE II (INSTRUMENTS) ×3 IMPLANT
NDL SCORPION MULTI FIRE (NEEDLE) IMPLANT
NEEDLE SCORPION MULTI FIRE (NEEDLE) ×3 IMPLANT
NS IRRIG 1000ML POUR BTL (IV SOLUTION) ×3 IMPLANT
PACK DSU ARTHROSCOPY (CUSTOM PROCEDURE TRAY) ×3 IMPLANT
PAD ARMBOARD 7.5X6 YLW CONV (MISCELLANEOUS) ×3 IMPLANT
PAD COLD SHLDR WRAP-ON (PAD) ×2 IMPLANT
PROBE APOLLO 90XL (SURGICAL WAND) ×3 IMPLANT
SLING ARM FOAM STRAP LRG (SOFTGOODS) ×2 IMPLANT
SLING ARM FOAM STRAP MED (SOFTGOODS) IMPLANT
SLING ARM IMMOBILIZER MED (SOFTGOODS) ×2 IMPLANT
SPONGE LAP 4X18 RFD (DISPOSABLE) ×3 IMPLANT
STRIP CLOSURE SKIN 1/2X4 (GAUZE/BANDAGES/DRESSINGS) ×2 IMPLANT
SUT FIBERWIRE #2 38 T-5 BLUE (SUTURE)
SUT MNCRL AB 3-0 PS2 18 (SUTURE) ×3 IMPLANT
SUT PDS AB 0 CT 36 (SUTURE) IMPLANT
SUT TIGER TAPE 7 IN WHITE (SUTURE) ×3 IMPLANT
SUTURE FIBERWR #2 38 T-5 BLUE (SUTURE) IMPLANT
TAPE FIBER 2MM 7IN #2 BLUE (SUTURE) ×2 IMPLANT
TAPE PAPER 3X10 WHT MICROPORE (GAUZE/BANDAGES/DRESSINGS) ×3 IMPLANT
TOWEL OR 17X26 10 PK STRL BLUE (TOWEL DISPOSABLE) ×3 IMPLANT
TUBING ARTHROSCOPY IRRIG 16FT (MISCELLANEOUS) ×3 IMPLANT
WATER STERILE IRR 1000ML POUR (IV SOLUTION) ×3 IMPLANT

## 2019-10-02 NOTE — Anesthesia Preprocedure Evaluation (Signed)
Anesthesia Evaluation  Patient identified by MRN, date of birth, ID band Patient awake    Reviewed: Allergy & Precautions, NPO status , Patient's Chart, lab work & pertinent test results  History of Anesthesia Complications (+) PONV  Airway Mallampati: I  TM Distance: >3 FB Neck ROM: Full    Dental   Pulmonary COPD, Current Smoker,    Pulmonary exam normal        Cardiovascular Normal cardiovascular exam     Neuro/Psych Seizures -,  Anxiety    GI/Hepatic GERD  Medicated and Controlled,  Endo/Other    Renal/GU      Musculoskeletal   Abdominal   Peds  Hematology   Anesthesia Other Findings   Reproductive/Obstetrics                             Anesthesia Physical Anesthesia Plan  ASA: III  Anesthesia Plan: General   Post-op Pain Management:  Regional for Post-op pain   Induction: Intravenous  PONV Risk Score and Plan: 3 and Ondansetron, Scopolamine patch - Pre-op, Midazolam and Dexamethasone  Airway Management Planned: Oral ETT  Additional Equipment:   Intra-op Plan:   Post-operative Plan: Extubation in OR  Informed Consent: I have reviewed the patients History and Physical, chart, labs and discussed the procedure including the risks, benefits and alternatives for the proposed anesthesia with the patient or authorized representative who has indicated his/her understanding and acceptance.       Plan Discussed with: CRNA and Surgeon  Anesthesia Plan Comments:         Anesthesia Quick Evaluation

## 2019-10-02 NOTE — Plan of Care (Signed)
  Problem: Education: Goal: Knowledge of the prescribed therapeutic regimen will improve Outcome: Progressing Goal: Individualized Educational Video(s) Outcome: Progressing   Problem: Activity: Goal: Ability to tolerate increased activity will improve Outcome: Progressing   Problem: Pain Management: Goal: Pain level will decrease with appropriate interventions Outcome: Progressing

## 2019-10-02 NOTE — Discharge Instructions (Signed)
   Lisa Ryan. Supple, M.D., F.A.A.O.S. Orthopaedic Surgery Specializing in Arthroscopic and Reconstructive Surgery of the Shoulder [phone] [address] [place], [place] [address] - Fax [phone]  POST-OP SHOULDER ARTHROSCOPIC ROTATOR CUFF AND/OR LABRAL REPAIR INSTRUCTIONS  1. Call the office at [phone] to schedule your first post-op appointment 7-10 days from the date of your surgery.  2. Leave the steri-strips in place over your incisions when performing dressing changes and showering. You may remove your dressings and begin showering 72 hours from surgery. You can expect drainage that is clear to bloody in nature that occasionally will soak through your dressings. If this occurs go ahead and perform a dressing change. The drainage should lessen daily and when there is no drainage from your incisions feel free to go without a dressing.  3. Wear your sling/immobilizer at all times except to perform the exercises below or to occasionally let your arm dangle by your side to stretch your elbow. You also need to sleep in your sling immobilizer until instructed otherwise.  4. Range of motion to your elbow, wrist, and hand are encouraged 3-5 times daily. Exercise to your hand and fingers helps to reduce swelling you may experience.  5. Utilize ice to the shoulder 3-4 times minimum a day and additionally if you are experiencing pain.  6. You may one-armed drive when safely off of narcotics and muscle relaxants. You may use your hand that is in the sling to support the steering wheel only. However, should it be your right arm that is in the sling it is not to be used for gear shifting in a manual transmission.  7. Pain control following an exparel block  To help control your post-operative pain you received a nerve block  performed with Exparel which is a long acting anesthetic (numbing agent) which can provide pain relief and sensations of numbness (and relief of pain) in the  operative shoulder and arm for up to 3 days. Sometimes it provides mixed relief, meaning you may still have numbness in certain areas of the arm but can still be able to move  parts of that arm, hand, and fingers. We recommend that your prescribed pain medications  be used as needed. We do not feel it is necessary to "pre medicate" and "stay ahead" of pain.  Taking narcotic pain medications when you are not having any pain can lead to unnecessary and potentially dangerous side effects.    8. Pain medications can produce constipation along with their use. If you experience this, the use of an over the counter stool softener or laxative daily is recommended.   9. For additional questions or concerns, please do not hesitate to call the office. If after hours there is an answering service to forward your concerns to the physician on call.  POST-OP EXERCISES  Ok to allow arm to dangle and to move it for hygiene. Sling on but can remove to allow arm to dangle and move elbow wrist and hand. No exercises otherwise yet

## 2019-10-02 NOTE — Anesthesia Procedure Notes (Signed)
Procedure Name: Intubation Date/Time: 10/02/2019 1:07 PM Performed by: Claudia Desanctis, CRNA Pre-anesthesia Checklist: Patient identified, Emergency Drugs available, Suction available and Patient being monitored Patient Re-evaluated:Patient Re-evaluated prior to induction Oxygen Delivery Method: Circle system utilized Preoxygenation: Pre-oxygenation with 100% oxygen Induction Type: IV induction Ventilation: Mask ventilation without difficulty Laryngoscope Size: 2, Mac and 3 Grade View: Grade I Tube type: Oral Number of attempts: 1 Airway Equipment and Method: Stylet Placement Confirmation: ETT inserted through vocal cords under direct vision,  positive ETCO2 and breath sounds checked- equal and bilateral Secured at: 21 cm Tube secured with: Tape Dental Injury: Teeth and Oropharynx as per pre-operative assessment

## 2019-10-02 NOTE — Anesthesia Procedure Notes (Addendum)
Anesthesia Regional Block: Interscalene brachial plexus block   Pre-Anesthetic Checklist: ,, timeout performed, Correct Patient, Correct Site, Correct Laterality, Correct Procedure, Correct Position, site marked, Risks and benefits discussed,  Surgical consent,  Pre-op evaluation,  At surgeon's request and post-op pain management  Laterality: Right  Prep: chloraprep       Needles:  Injection technique: Single-shot  Needle Type: Echogenic Stimulator Needle     Needle Length: 5cm  Needle Gauge: 21     Additional Needles:   Procedures:, nerve stimulator,,,,,,,   Nerve Stimulator or Paresthesia:  Response: 0.4 mA,   Additional Responses:   Narrative:  Start time: 10/02/2019 12:00 PM End time: 10/02/2019 12:10 PM Injection made incrementally with aspirations every 5 mL.  Performed by: Personally  Anesthesiologist: Lillia Abed, MD  Additional Notes: Monitors applied. Patient sedated. Sterile prep and drape,hand hygiene and sterile gloves were used. Relevant anatomy identified.Needle position confirmed.Local anesthetic injected incrementally after negative aspiration. Local anesthetic spread visualized around nerve(s). Vascular puncture avoided. No complications. Image printed for medical record.The patient tolerated the procedure well.

## 2019-10-02 NOTE — Op Note (Signed)
10/02/2019  2:36 PM  PATIENT:   Lisa Ryan  44 y.o. female  PRE-OPERATIVE DIAGNOSIS:  Right shoulder recurrent rotator cuff tear  POST-OPERATIVE DIAGNOSIS: Same  PROCEDURE:  #1 right shoulder examination under anesthesia  2.  Right shoulder glenohumeral joint diagnostic arthroscopy  3.  Subacromial bursectomy and lysis of adhesions  4.  Removal of retained suture anchors and suture material  5.  Revision rotator cuff repair  SURGEON:  Raven Harmes, Metta Clines. M.D.  ASSISTANTS: Jenetta Loges, PA-C  ANESTHESIA:   General endotracheal and interscalene block with Exparel  EBL: Minimal  SPECIMEN: None  Drains: None   PATIENT DISPOSITION:  PACU - hemodynamically stable.    PLAN OF CARE: Discharge to home after PACU  Brief history:  Lisa Ryan is a 44 year old female status post a right shoulder arthroscopic rotator cuff repair that we have performed approximately 2 months ago.  During her postop recovery she had an episode when she was bathing her grandchild and apparently the child slipped and she reached to grab the child with both arms and had the immediate onset of right shoulder pain.  Since that episode she has had continued right shoulder pain with increased difficulties and failure to progress in therapy.  Subsequently MRI scan was obtained that showed a recurrent tear of the rotator cuff.  No obvious displaced suture anchors.  Due to her persistent pain and obvious recurrent rotator cuff tear based on MRI criteria she is brought to the operating this time for planned revision rotator cuff repair.  Preoperatively had counseled Lisa Ryan regarding treatment options as well as the potential risks versus benefits thereof.  Possible surgical complications were reviewed including bleeding, infection, neurovascular injury, persistence of pain, loss of motion, anesthetic complication, recurrence of rotator cuff tear, and possible need for additional surgery.  She understands, and  accepts, and agrees with our planned procedure.  Procedure in detail:  After undergoing routine preop evaluation patient received prophylactic antibiotics and interscalene block with Exparel established in the holding area by the anesthesia department.  Subsequently placed supine on the operating table and underwent the smooth induction of a general endotracheal anesthesia.  Turned to the left lateral decubitus position on a beanbag and appropriately padded and protected.  Right shoulder examination under anesthesia revealed mildly restricted mobility.  Gentle manipulation was performed and achieved nearly full motion.  There was no palpable or audible release of adhesions.  Right arm subsequently suspended at 70 degrees of abduction with 15 pounds of traction in the right shoulder girdle region was sterilely prepped and draped in standard fashion.  Timeout was called.  A posterior portal was established to the glenohumeral joint and an anterior portal established under direct visualization.  The articular surfaces were all found to be in good condition.  There was some moderate synovitis and limited synovectomy was performed.  The biceps labrum was intact.  The long head biceps tendon was of normal caliber with no evidence for proximal or distal instability.  The rotator cuff showed a defect involving the distal supraspinatus with some suture material visible when viewed from within the joint.  On further debridement and probing it was clear that there was a full-thickness defect and this actually was a somewhat larger tear than we had originally repaired.  We then completed the inspection within the glenohumeral joint.  Fluid and inserts removed.  The arm was then dropped down to 30 degrees of abduction with the arthroscope introduced into the subacromial space of the posterior  portal and a direct lateral portal was established into the subacromial space.  Multiple adhesions were divided and excised.  There is  no evidence for residual bony impingement.  We identified the previously placed suture material which we divided and removed.  We then debrided the rotator cuff back to healthy tissue and ultimately had a defect approximately 3 cm in width.  We removed the previously placed medial row anchor as well as her anterolateral anchor and the associated suture material.  We then debrided the rotator cuff back to healthy tissue and I used the elevator to mobilize the rotator cuff dividing adhesions on both the articular and bursal sides.  I confirmed that there was good mobility of the rotator cuff.  We then placed 2 Medial Row anchors the anterior vena 5 5 the posterior and a 475 with EGDs being loaded with a fiber tape suture.  All 8 suture limbs were then shuttled equidistant across the width of the rotator cuff tear using the scorpion suture passer.  We then utilized to lateral row anchors the anterior vena 5 5 or which we placed into the previous bone tunnel and good obtain good purchase.  The posterior lateral anchor we did utilize bone adjacent to the previous anchor with good purchase.  Ultimately construct was much to our satisfaction with excellent reapposition of the rotator cuff tendon against the bony bed and tuberosity.  Suture limbs all then clipped.  Bursectomy was completed.  Hemostasis was obtained.  Fluid and instruments were removed.  The portals were closed with Monocryl and the surface appeared a dry dressing taped about the right shoulder right arm was placed into a sling immobilizer with abduction pillow and the patient was awakened, extubated, and taken to recovery in stable condition.  Jenetta Loges, PA-C was utilized as an Environmental consultant throughout this case, essential for help with positioning the patient, positioning extremity, tissue manipulation, implantation of the prosthesis, suture management, wound closure, and intraoperative decision-making.  Marin Shutter MD   Contact #  [phone]

## 2019-10-02 NOTE — H&P (Signed)
Lisa Ryan    Chief Complaint: Right shoulder rotator cuff tear HPI: The patient is a 44 y.o. female status post recent rotator cuff repair with subsequent postop lifting episode which she noted significant increase shoulder pain.  She now continues to have significant difficulties with pain and weakness.  Subsequent MRI scan unfortunately shows a recurrent rotator cuff tear.  She is brought to the operating this time for planned revision rotator cuff repair  Past Medical History:  Diagnosis Date  . Anemia   . Anginal pain ([place])    due to asthma meds  . Anxiety state, unspecified 10/12/2007  . Arthritis   . Asthma   . CHEST PAIN, ATYPICAL 02/18/2010   after using albuterol  . CHEST PAIN-PRECORDIAL 02/19/2010   after albuterol  . CHF (congestive heart failure) ([place]) 1999   During Pregnancy, After C section  . COPD (chronic obstructive pulmonary disease) ([place])   . Diverticulosis   . FATIGUE 09/28/2007  . Fatty liver   . GASTROENTERITIS 10/03/2008  . GERD (gastroesophageal reflux disease)    occ takes OTC ttums  . History of vitamin D deficiency   . Hypothyroidism   . MIGRAINE HEADACHE 03/04/2010  . Obesity   . PEDAL EDEMA 02/18/2010   comes and goes   . PEPTIC ULCER DISEASE 09/28/2007  . 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 PONV (postoperative nausea and vomiting)   . SEIZURE DISORDER 09/28/2007  . SUI (stress urinary incontinence, female)   . Thyroid nodule   . Tobacco abuse   . WEIGHT GAIN 09/28/2007    Past Surgical History:  Procedure Laterality Date  . ANKLE SURGERY     left  . APPENDECTOMY    . CARDIAC CATHETERIZATION  2015  . CESAREAN SECTION     x3  . CHOLECYSTECTOMY    . DILITATION & CURRETTAGE/HYSTROSCOPY WITH NOVASURE ABLATION N/A 12/26/2015   Procedure: DILATATION & CURETTAGE/HYSTEROSCOPY WITH ENDOMETRIAL ABLATION;  Surgeon: Jonnie Kind, MD;  Location: [facility];  Service: Gynecology;  Laterality: N/A;  . ESOPHAGOGASTRODUODENOSCOPY   2012  . KNEE ARTHROSCOPY Left    x2  . MOUTH LESION EXCISIONAL BIOPSY  04/2011   pre-cancerous, pallet of mouth  . SHOULDER ARTHROSCOPY WITH ROTATOR CUFF REPAIR Right 08/16/2019   Procedure: Right shoulder arthroscopy, subacromial decompression, distal clavicle resection, rotator cuff repair;  Surgeon: Justice Britain, MD;  Location: [facility];  Service: Orthopedics;  Laterality: Right;  160min    Family History  Problem Relation Age of Onset  . Cerebral aneurysm Mother   . Colon cancer Mother   . Heart disease Father   . Liver disease Father   . Diabetes Brother 1       died age 81  . Kidney disease Maternal Grandmother   . Colon cancer Maternal Grandmother   . Colon cancer Maternal Aunt   . Diabetes Sister     Social History:  reports that she has been smoking cigarettes. She has a 15.00 pack-year smoking history. She has never used smokeless tobacco. She reports previous alcohol use. She reports that she does not use drugs.   Medications Prior to Admission  Medication Sig Dispense Refill  . cyclobenzaprine (FLEXERIL) 10 MG tablet Take 1 tablet (10 mg total) by mouth 3 (three) times daily as needed for muscle spasms. 30 tablet 1  . DULoxetine (CYMBALTA) 30 MG capsule TAKE 3 CAPSULES BY MOUTH EVERY DAY AT BEDTIME (Patient taking differently: Take 60 mg by mouth at bedtime. ) 90 capsule 3  .  naproxen (NAPROSYN) 500 MG tablet Take 1 tablet (500 mg total) by mouth 2 (two) times daily with a meal. 60 tablet 1  . NP THYROID 30 MG tablet TAKE 1 TABLET BY MOUTH IN THE MID AFTERNOON (Patient taking differently: Take 60 mg by mouth daily. ) 30 tablet 3  . ondansetron (ZOFRAN) 4 MG tablet Take 1 tablet (4 mg total) by mouth every 8 (eight) hours as needed for nausea or vomiting. 10 tablet 0  . oxyCODONE-acetaminophen (PERCOCET) 5-325 MG tablet Take 1 tablet by mouth every 4 (four) hours as needed for severe pain (max 6 q). 20 tablet 0  . progesterone (PROMETRIUM) 200 MG capsule TAKE 2 CAPSULES(400  MG) BY MOUTH AT BEDTIME (Patient taking differently: Take 400 mg by mouth at bedtime. ) 60 capsule 3  . traMADol (ULTRAM) 50 MG tablet Take 1 tablet (50 mg total) by mouth every 6 (six) hours as needed for moderate pain. 20 tablet 0  . ibuprofen (ADVIL) 800 MG tablet Take 1 tablet (800 mg total) by mouth daily as needed for mild pain or moderate pain. 30 tablet 1  . ipratropium-albuterol (DUONEB) 0.5-2.5 (3) MG/3ML SOLN Take 3 mLs by nebulization every 6 (six) hours as needed. 360 mL 0  . levalbuterol (XOPENEX HFA) 45 MCG/ACT inhaler Inhale 1-2 puffs into the lungs daily as needed for wheezing or shortness of breath.    . varenicline (CHANTIX) 0.5 MG tablet Take 1 tablet (0.5 mg total) by mouth 2 (two) times daily. (Patient not taking: Reported on 07/03/2018) 30 tablet 1     Physical Exam: Right shoulder demonstrates the arthroscopy portals all to be well-healed.  She has painful and limited motion.  Global weakness.  Recent MRI scan does confirm a recurrent full-thickness rotator cuff tear.  Vitals  Temp:  [97.8 F (36.6 C)] 97.8 F (36.6 C) (06/01 1013) Pulse Rate:  [80-125] 110 (06/01 1233) Resp:  [11-21] 16 (06/01 1233) BP: (101-138)/(71-88) 124/77 (06/01 1233) SpO2:  [98 %-100 %] 98 % (06/01 1233) Weight:  [81.6 kg] 81.6 kg (06/01 1038)  Assessment/Plan  Impression: Right shoulder recurrent rotator cuff tear  Plan of Action: Procedure(s): SHOULDER ARTHROSCOPY WITH ROTATOR CUFF REPAIR  Lisa Ryan M Sparrow Sanzo 10/02/2019, 12:57 PM Contact # [phone]

## 2019-10-02 NOTE — Transfer of Care (Signed)
Immediate Anesthesia Transfer of Care Note  Patient: Tiaria Dioguardi  Procedure(s) Performed: SHOULDER ARTHROSCOPY WITH ROTATOR CUFF REPAIR (Right Shoulder)  Patient Location: PACU  Anesthesia Type:GA combined with regional for post-op pain  Level of Consciousness: awake, alert , oriented and patient cooperative  Airway & Oxygen Therapy: Patient Spontanous Breathing and Patient connected to face mask  Post-op Assessment: Report given to RN and Post -op Vital signs reviewed and stable  Post vital signs: Reviewed and stable  Last Vitals:  Vitals Value Taken Time  BP 111/72 10/02/19 1446  Temp    Pulse 102 10/02/19 1452  Resp    SpO2 97 % 10/02/19 1452  Vitals shown include unvalidated device data.  Last Pain:  Vitals:   10/02/19 1233  TempSrc:   PainSc: 0-No pain      Patients Stated Pain Goal: 3 ([phone] [phone])  Complications: No apparent anesthesia complications

## 2019-10-02 NOTE — Progress Notes (Addendum)
Assisted Dr. Ossey with right, ultrasound guided, interscalene  block. Side rails up, monitors on throughout procedure. See vital signs in flow sheet. Tolerated Procedure well. 

## 2019-10-03 ENCOUNTER — Encounter: Payer: Self-pay | Admitting: *Deleted

## 2019-10-03 DIAGNOSIS — S46011A Strain of muscle(s) and tendon(s) of the rotator cuff of right shoulder, initial encounter: Secondary | ICD-10-CM | POA: Diagnosis not present

## 2019-10-03 MED ORDER — ONDANSETRON HCL 4 MG PO TABS: 4.0000 mg | ORAL_TABLET | Freq: Three times a day (TID) | ORAL | 0 refills | Status: DC | PRN

## 2019-10-03 MED ORDER — OXYCODONE-ACETAMINOPHEN 5-325 MG PO TABS: 1.0000 | ORAL_TABLET | ORAL | 0 refills | Status: DC | PRN

## 2019-10-03 MED ORDER — NAPROXEN 500 MG PO TABS: 500.0000 mg | ORAL_TABLET | Freq: Two times a day (BID) | ORAL | 1 refills | Status: DC

## 2019-10-03 MED ORDER — TRAMADOL HCL 50 MG PO TABS: 50.0000 mg | ORAL_TABLET | Freq: Four times a day (QID) | ORAL | 0 refills | Status: DC | PRN

## 2019-10-03 MED ORDER — CYCLOBENZAPRINE HCL 10 MG PO TABS
10.0000 mg | ORAL_TABLET | Freq: Three times a day (TID) | ORAL | 1 refills | Status: DC | PRN
Start: 2019-10-03 — End: 2020-04-23

## 2019-10-03 NOTE — Plan of Care (Signed)
  Problem: Education: Goal: Knowledge of the prescribed therapeutic regimen will improve 10/03/2019 0838 by Hubert Azure, RN Outcome: Adequate for Discharge 10/03/2019 0837 by Hubert Azure, RN Outcome: Progressing Goal: Understanding of activity limitations/precautions following surgery will improve 10/03/2019 0838 by Hubert Azure, RN Outcome: Adequate for Discharge 10/03/2019 0837 by Hubert Azure, RN Outcome: Progressing Goal: Individualized Educational Video(s) 10/03/2019 0838 by Hubert Azure, RN Outcome: Adequate for Discharge 10/03/2019 0837 by Hubert Azure, RN Outcome: Progressing   Problem: Activity: Goal: Ability to tolerate increased activity will improve 10/03/2019 0838 by Hubert Azure, RN Outcome: Adequate for Discharge 10/03/2019 0837 by Hubert Azure, RN Outcome: Progressing   Problem: Pain Management: Goal: Pain level will decrease with appropriate interventions Outcome: Adequate for Discharge   Problem: Education: Goal: Knowledge of General Education information will improve Description: Including pain rating scale, medication(s)/side effects and non-pharmacologic comfort measures Outcome: Adequate for Discharge   Problem: Health Behavior/Discharge Planning: Goal: Ability to manage health-related needs will improve Outcome: Adequate for Discharge   Problem: Clinical Measurements: Goal: Ability to maintain clinical measurements within normal limits will improve Outcome: Adequate for Discharge Goal: Will remain free from infection Outcome: Adequate for Discharge Goal: Diagnostic test results will improve Outcome: Adequate for Discharge Goal: Respiratory complications will improve Outcome: Adequate for Discharge Goal: Cardiovascular complication will be avoided Outcome: Adequate for Discharge   Problem: Activity: Goal: Risk for activity intolerance will decrease Outcome: Adequate for Discharge   Problem: Nutrition: Goal: Adequate nutrition will  be maintained Outcome: Adequate for Discharge   Problem: Coping: Goal: Level of anxiety will decrease Outcome: Adequate for Discharge   Problem: Elimination: Goal: Will not experience complications related to bowel motility Outcome: Adequate for Discharge Goal: Will not experience complications related to urinary retention Outcome: Adequate for Discharge   Problem: Pain Managment: Goal: General experience of comfort will improve Outcome: Adequate for Discharge   Problem: Safety: Goal: Ability to remain free from injury will improve Outcome: Adequate for Discharge   Problem: Skin Integrity: Goal: Risk for impaired skin integrity will decrease Outcome: Adequate for Discharge

## 2019-10-03 NOTE — Discharge Summary (Signed)
PATIENT ID:      Lisa Ryan  MRN:     [id number] DOB/AGE:    [birth year] / 44 y.o.     DISCHARGE SUMMARY  ADMISSION DATE:    10/02/2019 DISCHARGE DATE:    ADMISSION DIAGNOSIS: Right shoulder rotator cuff tear Past Medical History:  Diagnosis Date  . Anemia   . Anginal pain ([place])    due to asthma meds  . Anxiety state, unspecified 10/12/2007  . Arthritis   . Asthma   . CHEST PAIN, ATYPICAL 02/18/2010   after using albuterol  . CHEST PAIN-PRECORDIAL 02/19/2010   after albuterol  . CHF (congestive heart failure) ([place]) 1999   During Pregnancy, After C section  . COPD (chronic obstructive pulmonary disease) ([place])   . Diverticulosis   . FATIGUE 09/28/2007  . Fatty liver   . GASTROENTERITIS 10/03/2008  . GERD (gastroesophageal reflux disease)    occ takes OTC ttums  . History of vitamin D deficiency   . Hypothyroidism   . MIGRAINE HEADACHE 03/04/2010  . Obesity   . PEDAL EDEMA 02/18/2010   comes and goes   . PEPTIC ULCER DISEASE 09/28/2007  . 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 PONV (postoperative nausea and vomiting)   . SEIZURE DISORDER 09/28/2007  . SUI (stress urinary incontinence, female)   . Thyroid nodule   . Tobacco abuse   . WEIGHT GAIN 09/28/2007    DISCHARGE DIAGNOSIS:   Active Problems:   S/P right rotator cuff repair   PROCEDURE: Procedure(s): SHOULDER ARTHROSCOPY WITH ROTATOR CUFF REPAIR on 10/02/2019  CONSULTS:    HISTORY:  See H&P in chart.  HOSPITAL COURSE:  Lisa Ryan is a 44 y.o. admitted on 10/02/2019 with a diagnosis of Right shoulder rotator cuff tear.  They were brought to the operating room on 10/02/2019 and underwent Procedure(s): SHOULDER ARTHROSCOPY WITH ROTATOR CUFF REPAIR.    They were given perioperative antibiotics:  Anti-infectives (From admission, onward)   Start     Dose/Rate Route Frequency Ordered Stop   10/02/19 1015  ceFAZolin (ANCEF) IVPB 2g/100 mL premix     2 g 200 mL/hr over 30 Minutes Intravenous On  call to O.R. 10/02/19 1007 10/02/19 1322    .  Patient underwent the above named procedure and tolerated it well. The following day they were hemodynamically stable however she had a painful night. They were neurovascularly intact to the operative extremity. She unfortunately did not receive long acting pain relief from block but the uncomfortable difficulty breathing sensation had also worn offl. They were medically and orthopaedically stable for discharge on .    DIAGNOSTIC STUDIES:  RECENT RADIOGRAPHIC STUDIES :  No results found.  RECENT VITAL SIGNS:   Patient Vitals for the past 24 hrs:  BP Temp Temp src Pulse Resp SpO2 Height Weight  10/03/19 0630 113/78 97.6 F (36.4 C) Oral 100 15 99 % -- --  10/03/19 0111 126/74 98.2 F (36.8 C) Oral (!) 109 16 97 % -- --  10/02/19 2059 127/76 98.3 F (36.8 C) Oral (!) 108 16 97 % -- --  10/02/19 1930 129/77 98.2 F (36.8 C) Oral 99 14 99 % -- --  10/02/19 1831 113/78 97.9 F (36.6 C) Oral 94 16 97 % -- --  10/02/19 1739 120/71 98.4 F (36.9 C) Oral (!) 105 14 98 % -- --  10/02/19 1700 117/74 -- -- 92 15 98 % -- --  10/02/19 1645 106/72 -- -- 91 14 97 % -- --  10/02/19  1630 117/79 (!) 97.5 F (36.4 C) -- 89 15 96 % -- --  10/02/19 1615 114/86 -- -- 84 13 97 % -- --  10/02/19 1603 107/79 -- -- 86 15 97 % -- --  10/02/19 1545 107/79 97.7 F (36.5 C) -- 91 16 96 % -- --  10/02/19 1530 111/76 -- -- 94 19 98 % -- --  10/02/19 1515 122/82 -- -- 94 17 100 % -- --  10/02/19 1500 120/82 -- -- (!) 101 14 96 % -- --  10/02/19 1446 111/72 (!) 97.5 F (36.4 C) -- (!) 106 -- 100 % -- --  10/02/19 1233 124/77 -- -- (!) 110 16 98 % -- --  10/02/19 1223 135/84 -- -- (!) 116 19 99 % -- --  10/02/19 1218 138/88 -- -- -- -- 100 % -- --  10/02/19 1213 125/71 -- -- (!) 111 (!) 21 99 % -- --  10/02/19 1208 110/74 -- -- 87 13 99 % -- --  10/02/19 1203 111/77 -- -- 92 11 99 % -- --  10/02/19 1158 101/84 -- -- 80 13 100 % -- --  10/02/19 1153 112/75 --  -- 80 16 99 % -- --  10/02/19 1038 -- -- -- -- -- -- 5\' 6"  (1.676 m) 81.6 kg  10/02/19 1013 123/87 97.8 F (36.6 C) Oral (!) 102 16 99 % -- --  .  RECENT EKG RESULTS:    Orders placed or performed during the hospital encounter of 12/25/18  . EKG 12-Lead  . EKG 12-Lead  . ED EKG  . ED EKG  . EKG 12-Lead  . EKG 12-Lead  . EKG 12-Lead  . EKG 12-Lead  . EKG    DISCHARGE INSTRUCTIONS:    DISCHARGE MEDICATIONS:   Allergies as of 10/03/2019      Reactions   Albuterol Anxiety, Other (See Comments)   Increased HR 130s (pt tolerates duoneb)   Hydrocodone-acetaminophen Nausea And Vomiting   Morphine Other (See Comments)   hallucinations   Adhesive [tape] Rash   Paper tape is ok      Medication List    STOP taking these medications   ibuprofen 800 MG tablet Commonly known as: ADVIL     TAKE these medications   cyclobenzaprine 10 MG tablet Commonly known as: FLEXERIL Take 1 tablet (10 mg total) by mouth 3 (three) times daily as needed for muscle spasms.   DULoxetine 30 MG capsule Commonly known as: CYMBALTA Take 60 mg by mouth at bedtime.   ipratropium-albuterol 0.5-2.5 (3) MG/3ML Soln Commonly known as: DUONEB Take 3 mLs by nebulization every 6 (six) hours as needed.   levalbuterol 45 MCG/ACT inhaler Commonly known as: XOPENEX HFA Inhale 1-2 puffs into the lungs daily as needed for wheezing or shortness of breath.   naproxen 500 MG tablet Commonly known as: Naprosyn Take 1 tablet (500 mg total) by mouth 2 (two) times daily with a meal.   ondansetron 4 MG tablet Commonly known as: Zofran Take 1 tablet (4 mg total) by mouth every 8 (eight) hours as needed for nausea or vomiting.   oxyCODONE-acetaminophen 5-325 MG tablet Commonly known as: Percocet Take 1 tablet by mouth every 4 (four) hours as needed for severe pain (max 6 q).   progesterone 200 MG capsule Commonly known as: PROMETRIUM TAKE 2 CAPSULES(400 MG) BY MOUTH AT BEDTIME What changed: See the new  instructions.   thyroid 60 MG tablet Commonly known as: ARMOUR Take 60 mg by mouth daily  before breakfast.   traMADol 50 MG tablet Commonly known as: Ultram Take 1 tablet (50 mg total) by mouth every 6 (six) hours as needed for moderate pain.       FOLLOW UP VISIT:   Follow-up Information    Justice Britain, MD.   Specialty: Orthopedic Surgery Why: in 7-10 days Contact information: [address] [place] [address] [id number]           DISCHARGE [id number]   DISCHARGE CONDITION:  Thereasa Parkin Teigan Manner for Dr. Justice Britain 10/03/2019, 8:03 AM

## 2019-10-03 NOTE — Plan of Care (Signed)
  Problem: Education: Goal: Knowledge of the prescribed therapeutic regimen will improve Outcome: Progressing Goal: Understanding of activity limitations/precautions following surgery will improve Outcome: Progressing Goal: Individualized Educational Video(s) Outcome: Progressing   Problem: Activity: Goal: Ability to tolerate increased activity will improve Outcome: Progressing

## 2019-10-03 NOTE — Anesthesia Postprocedure Evaluation (Signed)
Anesthesia Post Note  Patient: Lisa Ryan  Procedure(s) Performed: SHOULDER ARTHROSCOPY WITH ROTATOR CUFF REPAIR (Right Shoulder)     Patient location during evaluation: PACU Anesthesia Type: General Level of consciousness: awake Pain management: pain level controlled Vital Signs Assessment: post-procedure vital signs reviewed and stable Respiratory status: spontaneous breathing Cardiovascular status: stable Anesthetic complications: yes Anesthetic complication details: PONV   Last Vitals:  Vitals:   10/03/19 0111 10/03/19 0630  BP: 126/74 113/78  Pulse: (!) 109 100  Resp: 16 15  Temp: 36.8 C 36.4 C  SpO2: 97% 99%    Last Pain:  Vitals:   10/03/19 1027  TempSrc:   PainSc: 7    Pain Goal: Patients Stated Pain Goal: 3 (10/03/19 1027)                 Huston Foley

## 2019-10-04 NOTE — Addendum Note (Signed)
Addendum  created 10/04/19 1341 by Lillia Abed, MD   Clinical Note Signed, Intraprocedure Blocks edited

## 2019-10-23 ENCOUNTER — Encounter (INDEPENDENT_AMBULATORY_CARE_PROVIDER_SITE_OTHER): Payer: Self-pay | Admitting: Internal Medicine

## 2019-10-24 ENCOUNTER — Ambulatory Visit (INDEPENDENT_AMBULATORY_CARE_PROVIDER_SITE_OTHER): Payer: 59 | Admitting: Internal Medicine

## 2019-10-25 ENCOUNTER — Encounter (INDEPENDENT_AMBULATORY_CARE_PROVIDER_SITE_OTHER): Payer: Self-pay | Admitting: Nurse Practitioner

## 2019-10-25 ENCOUNTER — Ambulatory Visit (INDEPENDENT_AMBULATORY_CARE_PROVIDER_SITE_OTHER): Payer: 59 | Admitting: Nurse Practitioner

## 2019-10-25 ENCOUNTER — Other Ambulatory Visit: Payer: Self-pay

## 2019-10-25 VITALS — BP 125/85 | HR 67 | Temp 97.1°F | Ht 67.0 in | Wt 183.4 lb

## 2019-10-25 DIAGNOSIS — J449 Chronic obstructive pulmonary disease, unspecified: Secondary | ICD-10-CM

## 2019-10-25 DIAGNOSIS — A6920 Lyme disease, unspecified: Secondary | ICD-10-CM | POA: Diagnosis not present

## 2019-10-25 MED ORDER — DOXYCYCLINE HYCLATE 100 MG PO TABS: 100.0000 mg | ORAL_TABLET | Freq: Two times a day (BID) | ORAL | 0 refills | Status: DC

## 2019-10-25 NOTE — Progress Notes (Signed)
Subjective:  Patient ID: Lisa Ryan, female    DOB: 08-Mar-1976  Age: 44 y.o. MRN: [id number]  CC:  Chief Complaint  Patient presents with  . Tick Bite      HPI  This patient comes in today for the above.  She tells me that approximately 2 weeks ago she found a tick on her.  She not sure how long the tick had been there.  Since then she has developed a rash to her abdomen, but denies any fever, headach or obvious myalgias.  She is concerned about Lyme disease or [place] spotted mountain fever.   Past Medical History:  Diagnosis Date  . Anemia   . Anginal pain ([place])    due to asthma meds  . Anxiety state, unspecified 10/12/2007  . Arthritis   . Asthma   . CHEST PAIN, ATYPICAL 02/18/2010   after using albuterol  . CHEST PAIN-PRECORDIAL 02/19/2010   after albuterol  . CHF (congestive heart failure) ([place]) 1999   During Pregnancy, After C section  . COPD (chronic obstructive pulmonary disease) ([place])   . Diverticulosis   . FATIGUE 09/28/2007  . Fatty liver   . GASTROENTERITIS 10/03/2008  . GERD (gastroesophageal reflux disease)    occ takes OTC ttums  . History of vitamin D deficiency   . Hypothyroidism   . MIGRAINE HEADACHE 03/04/2010  . Obesity   . PEDAL EDEMA 02/18/2010   comes and goes   . PEPTIC ULCER DISEASE 09/28/2007  . 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 PONV (postoperative nausea and vomiting)   . SEIZURE DISORDER 09/28/2007  . SUI (stress urinary incontinence, female)   . Thyroid nodule   . Tobacco abuse   . WEIGHT GAIN 09/28/2007      Family History  Problem Relation Age of Onset  . Cerebral aneurysm Mother   . Colon cancer Mother   . Heart disease Father   . Liver disease Father   . Diabetes Brother 1       died age 73  . Kidney disease Maternal Grandmother   . Colon cancer Maternal Grandmother   . Colon cancer Maternal Aunt   . Diabetes Sister     Social History   Social History Narrative  . Not on file    Social History   Tobacco Use  . Smoking status: Current Every Day Smoker    Packs/day: 1.00    Years: 15.00    Pack years: 15.00    Types: Cigarettes  . Smokeless tobacco: Never Used  Substance Use Topics  . Alcohol use: Not Currently    Comment: occ     Current Meds  Medication Sig  . cyclobenzaprine (FLEXERIL) 10 MG tablet Take 1 tablet (10 mg total) by mouth 3 (three) times daily as needed for muscle spasms.  . DULoxetine (CYMBALTA) 30 MG capsule Take 60 mg by mouth at bedtime.  Marland Kitchen ipratropium-albuterol (DUONEB) 0.5-2.5 (3) MG/3ML SOLN Take 3 mLs by nebulization every 6 (six) hours as needed.  . levalbuterol (XOPENEX HFA) 45 MCG/ACT inhaler Inhale 1-2 puffs into the lungs daily as needed for wheezing or shortness of breath.  . naproxen (NAPROSYN) 500 MG tablet Take 1 tablet (500 mg total) by mouth 2 (two) times daily with a meal.  . ondansetron (ZOFRAN) 4 MG tablet Take 1 tablet (4 mg total) by mouth every 8 (eight) hours as needed for nausea or vomiting.  Marland Kitchen oxyCODONE-acetaminophen (PERCOCET) 5-325 MG tablet Take 1 tablet by mouth every 4 (  four) hours as needed for severe pain (max 6 q).  . progesterone (PROMETRIUM) 200 MG capsule TAKE 2 CAPSULES(400 MG) BY MOUTH AT BEDTIME (Patient taking differently: Take 400 mg by mouth at bedtime. )  . thyroid (ARMOUR) 60 MG tablet Take 60 mg by mouth daily before breakfast.  . traMADol (ULTRAM) 50 MG tablet Take 1 tablet (50 mg total) by mouth every 6 (six) hours as needed for moderate pain.    ROS:  Review of Systems  Constitutional: Negative for fever.  Respiratory: Positive for shortness of breath.   Skin: Positive for rash.  Neurological: Negative for headaches.     Objective:   Today's Vitals: BP 125/85 (BP Location: Left Arm, Patient Position: Sitting, Cuff Size: Normal)   Pulse 67   Temp (!) 97.1 F (36.2 C) (Temporal)   Ht 5\' 7"  (1.702 m)   Wt 183 lb 6.4 oz (83.2 kg)   SpO2 98% Comment: on RA sitting  BMI 28.72 kg/m   Vitals with BMI 10/25/2019 10/03/2019 10/03/2019  Height 5\' 7"  - -  Weight 183 lbs 6 oz - -  BMI 41.93 - -  Systolic 790 240 973  Diastolic 85 78 74  Pulse 67 100 109     Physical Exam Vitals reviewed.  Constitutional:      General: She is not in acute distress.    Appearance: Normal appearance.  HENT:     Head: Normocephalic and atraumatic.  Neck:     Vascular: No carotid bruit.  Cardiovascular:     Rate and Rhythm: Normal rate and regular rhythm.     Pulses: Normal pulses.     Heart sounds: Normal heart sounds.  Pulmonary:     Effort: Pulmonary effort is normal.     Breath sounds: Normal breath sounds.  Skin:    General: Skin is warm and dry.       Neurological:     General: No focal deficit present.     Mental Status: She is alert and oriented to person, place, and time.  Psychiatric:        Mood and Affect: Mood normal.        Behavior: Behavior normal.        Judgment: Judgment normal.       Walking Saturations: RA at Rest initially: 82% RA at Rest after sitting for approximately 5-10 minutes: 98% RA while Walking: 91% 2LPM of O2 via Nasal Cannula walking: 97%   Assessment and Plan   1. Erythema migrans (Lyme disease)   2. Chronic obstructive pulmonary disease, unspecified COPD type ([place])      Plan: 1.  I will prescribe doxycycline twice a day for 10 days.  I did warn her of risk of photosensitivity.  Tells me she understands.  She is encouraged let me know if her symptoms persist or worsen despite being on antibiotics.  2.  Initially upon rooming the patient's oxygen saturations were 82% on room air at rest.  After sitting for a few moments her oxygen saturations improved to 98% on room air at rest.  We attempted walking saturations in the office and while walking on room air her oxygen saturations remained at 91% or greater.  I did place her on 2 L of oxygen per minute via nasal cannula, and her oxygen saturations increased to 97% while ambulating.  I  will order home oxygen.  Tests ordered No orders of the defined types were placed in this encounter.     Meds ordered  this encounter  Medications  . doxycycline (VIBRA-TABS) 100 MG tablet    Sig: Take 1 tablet (100 mg total) by mouth 2 (two) times daily.    Dispense:  20 tablet    Refill:  0    Order Specific Question:   Supervising Provider    Answer:   Doree Albee [[id number]]    Patient to follow-up in 3 months or sooner as needed.  Ailene Ards, NP

## 2019-10-29 ENCOUNTER — Encounter (INDEPENDENT_AMBULATORY_CARE_PROVIDER_SITE_OTHER): Payer: Self-pay | Admitting: Internal Medicine

## 2019-11-11 ENCOUNTER — Other Ambulatory Visit (INDEPENDENT_AMBULATORY_CARE_PROVIDER_SITE_OTHER): Payer: Self-pay | Admitting: Internal Medicine

## 2019-12-19 ENCOUNTER — Other Ambulatory Visit (INDEPENDENT_AMBULATORY_CARE_PROVIDER_SITE_OTHER): Payer: Self-pay | Admitting: Internal Medicine

## 2020-01-27 ENCOUNTER — Other Ambulatory Visit (INDEPENDENT_AMBULATORY_CARE_PROVIDER_SITE_OTHER): Payer: Self-pay | Admitting: Internal Medicine

## 2020-01-28 NOTE — Telephone Encounter (Signed)
Please call patient to schedule her for follow-up appointment before she will have additional refills authorized.

## 2020-02-20 ENCOUNTER — Other Ambulatory Visit (INDEPENDENT_AMBULATORY_CARE_PROVIDER_SITE_OTHER): Payer: Self-pay | Admitting: Internal Medicine

## 2020-03-07 ENCOUNTER — Other Ambulatory Visit (INDEPENDENT_AMBULATORY_CARE_PROVIDER_SITE_OTHER): Payer: Self-pay | Admitting: Internal Medicine

## 2020-03-16 ENCOUNTER — Other Ambulatory Visit (INDEPENDENT_AMBULATORY_CARE_PROVIDER_SITE_OTHER): Payer: Self-pay | Admitting: Internal Medicine

## 2020-03-18 ENCOUNTER — Encounter (INDEPENDENT_AMBULATORY_CARE_PROVIDER_SITE_OTHER): Payer: Self-pay | Admitting: Internal Medicine

## 2020-03-18 ENCOUNTER — Other Ambulatory Visit (INDEPENDENT_AMBULATORY_CARE_PROVIDER_SITE_OTHER): Payer: Self-pay | Admitting: Nurse Practitioner

## 2020-03-19 ENCOUNTER — Other Ambulatory Visit (INDEPENDENT_AMBULATORY_CARE_PROVIDER_SITE_OTHER): Payer: Self-pay | Admitting: Internal Medicine

## 2020-03-19 ENCOUNTER — Ambulatory Visit: Payer: Self-pay | Admitting: Allergy & Immunology

## 2020-03-19 MED ORDER — DULOXETINE HCL 30 MG PO CPEP: ORAL_CAPSULE | ORAL | 0 refills | Status: DC

## 2020-03-19 MED ORDER — NP THYROID 60 MG PO TABS: 60.0000 mg | ORAL_TABLET | Freq: Every day | ORAL | 0 refills | Status: DC

## 2020-04-15 ENCOUNTER — Other Ambulatory Visit (INDEPENDENT_AMBULATORY_CARE_PROVIDER_SITE_OTHER): Payer: Self-pay | Admitting: Internal Medicine

## 2020-04-15 MED ORDER — PROGESTERONE 200 MG PO CAPS: 400.0000 mg | ORAL_CAPSULE | Freq: Every evening | ORAL | 3 refills | Status: DC

## 2020-04-23 ENCOUNTER — Encounter (INDEPENDENT_AMBULATORY_CARE_PROVIDER_SITE_OTHER): Payer: Self-pay | Admitting: Internal Medicine

## 2020-04-23 ENCOUNTER — Other Ambulatory Visit: Payer: Self-pay

## 2020-04-23 ENCOUNTER — Ambulatory Visit (INDEPENDENT_AMBULATORY_CARE_PROVIDER_SITE_OTHER): Payer: 59 | Admitting: Internal Medicine

## 2020-04-23 VITALS — BP 128/80 | HR 78 | Temp 97.1°F | Resp 18 | Ht 67.0 in | Wt 186.0 lb

## 2020-04-23 DIAGNOSIS — R5381 Other malaise: Secondary | ICD-10-CM | POA: Diagnosis not present

## 2020-04-23 DIAGNOSIS — N951 Menopausal and female climacteric states: Secondary | ICD-10-CM | POA: Diagnosis not present

## 2020-04-23 DIAGNOSIS — Z131 Encounter for screening for diabetes mellitus: Secondary | ICD-10-CM

## 2020-04-23 DIAGNOSIS — E559 Vitamin D deficiency, unspecified: Secondary | ICD-10-CM

## 2020-04-23 DIAGNOSIS — Z13220 Encounter for screening for lipoid disorders: Secondary | ICD-10-CM

## 2020-04-23 DIAGNOSIS — R5383 Other fatigue: Secondary | ICD-10-CM

## 2020-04-23 DIAGNOSIS — M791 Myalgia, unspecified site: Secondary | ICD-10-CM | POA: Diagnosis not present

## 2020-04-23 DIAGNOSIS — F520 Hypoactive sexual desire disorder: Secondary | ICD-10-CM

## 2020-04-23 NOTE — Progress Notes (Signed)
Metrics: Intervention Frequency ACO  Documented Smoking Status Yearly  Screened one or more times in 24 months  Cessation Counseling or  Active cessation medication Past 24 months  Past 24 months   Guideline developer: UpToDate (See UpToDate for funding source) Date Released: 2014       Wellness Office Visit  Subjective:  Patient ID: Lisa Ryan, female    DOB: 1976/01/26  Age: 44 y.o. MRN: [id number]  CC: This delightful lady comes in for follow-up of her multiple symptoms which include myalgia, perimenopausal symptoms, fatigue, decreased libido. HPI  She says that she aches all over including her muscles and joints.  She continues with NP thyroid. She also continues with progesterone for perimenopausal symptoms but she still describes hot flashes although they may be better than they were before. She also describes decreased libido.  Past Medical History:  Diagnosis Date  . Anemia   . Anginal pain ([place])    due to asthma meds  . Anxiety state, unspecified 10/12/2007  . Arthritis   . Asthma   . CHEST PAIN, ATYPICAL 02/18/2010   after using albuterol  . CHEST PAIN-PRECORDIAL 02/19/2010   after albuterol  . CHF (congestive heart failure) ([place]) 1999   During Pregnancy, After C section  . COPD (chronic obstructive pulmonary disease) ([place])   . Diverticulosis   . FATIGUE 09/28/2007  . Fatty liver   . GASTROENTERITIS 10/03/2008  . GERD (gastroesophageal reflux disease)    occ takes OTC ttums  . History of vitamin D deficiency   . Hypothyroidism   . MIGRAINE HEADACHE 03/04/2010  . Obesity   . PEDAL EDEMA 02/18/2010   comes and goes   . PEPTIC ULCER DISEASE 09/28/2007  . 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 PONV (postoperative nausea and vomiting)   . SEIZURE DISORDER 09/28/2007  . SUI (stress urinary incontinence, female)   . Thyroid nodule   . Tobacco abuse   . WEIGHT GAIN 09/28/2007   Past Surgical History:  Procedure Laterality Date  . ANKLE  SURGERY     left  . APPENDECTOMY    . CARDIAC CATHETERIZATION  2015  . CESAREAN SECTION     x3  . CHOLECYSTECTOMY    . DILITATION & CURRETTAGE/HYSTROSCOPY WITH NOVASURE ABLATION N/A 12/26/2015   Procedure: DILATATION & CURETTAGE/HYSTEROSCOPY WITH ENDOMETRIAL ABLATION;  Surgeon: Jonnie Kind, MD;  Location: [facility];  Service: Gynecology;  Laterality: N/A;  . ESOPHAGOGASTRODUODENOSCOPY  2012  . KNEE ARTHROSCOPY Left    x2  . MOUTH LESION EXCISIONAL BIOPSY  04/2011   pre-cancerous, pallet of mouth  . SHOULDER ARTHROSCOPY WITH ROTATOR CUFF REPAIR Right 08/16/2019   Procedure: Right shoulder arthroscopy, subacromial decompression, distal clavicle resection, rotator cuff repair;  Surgeon: Justice Britain, MD;  Location: [facility];  Service: Orthopedics;  Laterality: Right;  181min  . SHOULDER ARTHROSCOPY WITH ROTATOR CUFF REPAIR Right 10/02/2019   Procedure: SHOULDER ARTHROSCOPY WITH ROTATOR CUFF REPAIR;  Surgeon: Justice Britain, MD;  Location: [facility];  Service: Orthopedics;  Laterality: Right;  76min     Family History  Problem Relation Age of Onset  . Cerebral aneurysm Mother   . Colon cancer Mother   . Heart disease Father   . Liver disease Father   . Diabetes Brother 1       died age 69  . Kidney disease Maternal Grandmother   . Colon cancer Maternal Grandmother   . Colon cancer Maternal Aunt   . Diabetes Sister     Social History  Social History Narrative  . Not on file   Social History   Tobacco Use  . Smoking status: Current Every Day Smoker    Packs/day: 1.00    Years: 15.00    Pack years: 15.00    Types: Cigarettes  . Smokeless tobacco: Never Used  Substance Use Topics  . Alcohol use: Not Currently    Comment: occ    Current Meds  Medication Sig  . DULoxetine (CYMBALTA) 30 MG capsule TAKE 3 CAPSULES BY MOUTH EVERY DAY AT BEDTIME  . ipratropium-albuterol (DUONEB) 0.5-2.5 (3) MG/3ML SOLN Take 3 mLs by nebulization every 6 (six) hours as needed.  . levalbuterol  (XOPENEX HFA) 45 MCG/ACT inhaler Inhale 1-2 puffs into the lungs daily as needed for wheezing or shortness of breath.  . naproxen (NAPROSYN) 500 MG tablet Take 1 tablet (500 mg total) by mouth 2 (two) times daily with a meal.  . NP THYROID 60 MG tablet Take 1 tablet (60 mg total) by mouth daily before breakfast.  . progesterone (PROMETRIUM) 200 MG capsule Take 2 capsules (400 mg total) by mouth at bedtime.      Depression screen [facility] 2/9 04/23/2020  Decreased Interest 0  Down, Depressed, Hopeless 0  PHQ - 2 Score 0     Objective:   Today's Vitals: BP 128/80 (BP Location: Left Arm, Patient Position: Sitting, Cuff Size: Normal)   Pulse 78   Temp (!) 97.1 F (36.2 C) (Temporal)   Resp 18   Ht 5\' 7"  (1.702 m)   Wt 186 lb (84.4 kg)   SpO2 98%   BMI 29.13 kg/m  Vitals with BMI 04/23/2020 10/25/2019 10/03/2019  Height 5\' 7"  5\' 7"  -  Weight 186 lbs 183 lbs 6 oz -  BMI [id number] -  Systolic 0000000 [id number]  Diastolic 80 85 78  Pulse 78 67 100     Physical Exam  She looks systemically well.  She is overweight.  Blood pressure is acceptable.     Assessment   1. Myalgia   2. Perimenopause   3. Malaise and fatigue   4. Vitamin D deficiency disease   5. Hypoactive sexual desire disorder   6. Screening for diabetes mellitus   7. Screening for lipoid disorders       Tests ordered Orders Placed This Encounter  Procedures  . CBC  . COMPLETE METABOLIC PANEL WITH GFR  . Hemoglobin A1c  . Follicle stimulating hormone  . Lipid panel  . Progesterone  . T3, free  . TSH  . VITAMIN D 25 Hydroxy (Vit-D Deficiency, Fractures)  . Testos,Total,Free and SHBG (Female)     Plan: 1. Blood work is ordered. 2. Further recommendations will depend on blood results and I will see her in about 3 months for follow-up.   No orders of the defined types were placed in this encounter.   Doree Albee, MD

## 2020-04-24 MED ORDER — NAPROXEN 500 MG PO TABS: 500.0000 mg | ORAL_TABLET | Freq: Two times a day (BID) | ORAL | 1 refills | Status: DC

## 2020-04-28 LAB — TESTOS,TOTAL,FREE AND SHBG (FEMALE)
Free Testosterone: 2.7 pg/mL (ref 0.1–6.4)
Sex Hormone Binding: 48 nmol/L (ref 17–124)
Testosterone, Total, LC-MS-MS: 21 ng/dL (ref 2–45)

## 2020-04-28 LAB — COMPLETE METABOLIC PANEL WITH GFR
AG Ratio: 2.3 (calc) (ref 1.0–2.5)
ALT: 21 U/L (ref 6–29)
AST: 13 U/L (ref 10–35)
Albumin: 4.3 g/dL (ref 3.6–5.1)
Alkaline phosphatase (APISO): 71 U/L (ref 31–125)
BUN: 15 mg/dL (ref 7–25)
CO2: 24 mmol/L (ref 20–32)
Calcium: 9.2 mg/dL (ref 8.6–10.2)
Chloride: 105 mmol/L (ref 98–110)
Creat: 0.64 mg/dL (ref 0.50–1.10)
GFR, Est African American: 124 mL/min/{1.73_m2} (ref >=60)
GFR, Est Non African American: 107 mL/min/{1.73_m2} (ref >=60)
Globulin: 1.9 g/dL (calc) (ref 1.9–3.7)
Glucose, Bld: 75 mg/dL (ref 65–139)
Potassium: 4.7 mmol/L (ref 3.5–5.3)
Sodium: 138 mmol/L (ref 135–146)
Total Bilirubin: 0.3 mg/dL (ref 0.2–1.2)
Total Protein: 6.2 g/dL (ref 6.1–8.1)

## 2020-04-28 LAB — CBC
HCT: 40.5 % (ref 35.0–45.0)
Hemoglobin: 14.2 g/dL (ref 11.7–15.5)
MCH: 34.3 pg — ABNORMAL HIGH (ref 27.0–33.0)
MCHC: 35.1 g/dL (ref 32.0–36.0)
MCV: 97.8 fL (ref 80.0–100.0)
MPV: 10.4 fL (ref 7.5–12.5)
Platelets: 325 10*3/uL (ref 140–400)
RBC: 4.14 10*6/uL (ref 3.80–5.10)
RDW: 11.7 % (ref 11.0–15.0)
WBC: 6.8 10*3/uL (ref 3.8–10.8)

## 2020-04-28 LAB — LIPID PANEL
Cholesterol: 160 mg/dL (ref <200)
HDL: 44 mg/dL — ABNORMAL LOW (ref >=50)
LDL Cholesterol (Calc): 97 mg/dL (calc)
Non-HDL Cholesterol (Calc): 116 mg/dL (calc) (ref <130)
Total CHOL/HDL Ratio: 3.6 (calc) (ref <5.0)
Triglycerides: 95 mg/dL (ref <150)

## 2020-04-28 LAB — HEMOGLOBIN A1C
Hgb A1c MFr Bld: 5.1 % of total Hgb (ref <5.7)
Mean Plasma Glucose: 100 mg/dL
eAG (mmol/L): 5.5 mmol/L

## 2020-04-28 LAB — VITAMIN D 25 HYDROXY (VIT D DEFICIENCY, FRACTURES): Vit D, 25-Hydroxy: 21 ng/mL — ABNORMAL LOW (ref 30–100)

## 2020-04-28 LAB — PROGESTERONE: Progesterone: 43.1 ng/mL

## 2020-04-28 LAB — T3, FREE: T3, Free: 4.9 pg/mL — ABNORMAL HIGH (ref 2.3–4.2)

## 2020-04-28 LAB — FOLLICLE STIMULATING HORMONE: FSH: 15.9 m[IU]/mL

## 2020-04-28 LAB — TSH: TSH: 0.65 mIU/L

## 2020-04-30 ENCOUNTER — Other Ambulatory Visit (INDEPENDENT_AMBULATORY_CARE_PROVIDER_SITE_OTHER): Payer: Self-pay | Admitting: Internal Medicine

## 2020-04-30 MED ORDER — PREDNISONE 20 MG PO TABS
40.0000 mg | ORAL_TABLET | Freq: Every day | ORAL | 1 refills | Status: DC
Start: 2020-04-30 — End: 2020-05-12

## 2020-04-30 MED ORDER — BENZONATATE 100 MG PO CAPS
100.0000 mg | ORAL_CAPSULE | Freq: Two times a day (BID) | ORAL | 0 refills | Status: DC | PRN
Start: 2020-04-30 — End: 2020-05-12

## 2020-04-30 MED ORDER — AZITHROMYCIN 250 MG PO TABS: ORAL_TABLET | ORAL | 0 refills | Status: DC

## 2020-05-03 ENCOUNTER — Other Ambulatory Visit (INDEPENDENT_AMBULATORY_CARE_PROVIDER_SITE_OTHER): Payer: Self-pay | Admitting: Internal Medicine

## 2020-05-08 ENCOUNTER — Other Ambulatory Visit (INDEPENDENT_AMBULATORY_CARE_PROVIDER_SITE_OTHER): Payer: Self-pay | Admitting: Internal Medicine

## 2020-05-10 ENCOUNTER — Encounter (INDEPENDENT_AMBULATORY_CARE_PROVIDER_SITE_OTHER): Payer: Self-pay | Admitting: Internal Medicine

## 2020-05-12 ENCOUNTER — Other Ambulatory Visit: Payer: Self-pay

## 2020-05-12 ENCOUNTER — Encounter (INDEPENDENT_AMBULATORY_CARE_PROVIDER_SITE_OTHER): Payer: Self-pay | Admitting: Internal Medicine

## 2020-05-12 ENCOUNTER — Ambulatory Visit (INDEPENDENT_AMBULATORY_CARE_PROVIDER_SITE_OTHER): Payer: 59 | Admitting: Internal Medicine

## 2020-05-12 VITALS — BP 128/74 | HR 108 | Temp 96.8°F | Ht 67.0 in | Wt 186.6 lb

## 2020-05-12 DIAGNOSIS — R5381 Other malaise: Secondary | ICD-10-CM | POA: Diagnosis not present

## 2020-05-12 DIAGNOSIS — N951 Menopausal and female climacteric states: Secondary | ICD-10-CM

## 2020-05-12 DIAGNOSIS — E559 Vitamin D deficiency, unspecified: Secondary | ICD-10-CM

## 2020-05-12 DIAGNOSIS — E538 Deficiency of other specified B group vitamins: Secondary | ICD-10-CM | POA: Diagnosis not present

## 2020-05-12 DIAGNOSIS — F520 Hypoactive sexual desire disorder: Secondary | ICD-10-CM

## 2020-05-12 DIAGNOSIS — G4700 Insomnia, unspecified: Secondary | ICD-10-CM

## 2020-05-12 DIAGNOSIS — R5383 Other fatigue: Secondary | ICD-10-CM

## 2020-05-12 MED ORDER — NONFORMULARY OR COMPOUNDED ITEM: 10.0000 mg | 3 refills | Status: DC

## 2020-05-12 MED ORDER — NP THYROID 90 MG PO TABS
90.0000 mg | ORAL_TABLET | Freq: Every day | ORAL | 3 refills | Status: DC
Start: 2020-05-12 — End: 2020-09-23

## 2020-05-12 NOTE — Progress Notes (Signed)
Metrics: Intervention Frequency ACO  Documented Smoking Status Yearly  Screened one or more times in 24 months  Cessation Counseling or  Active cessation medication Past 24 months  Past 24 months   Guideline developer: UpToDate (See UpToDate for funding source) Date Released: 2014       Wellness Office Visit  Subjective:  Patient ID: Lisa Ryan, female    DOB: [birth year]  Age: 45 y.o. MRN: [id number]  CC: This lady is frustrated that she has very little energy, feels sleepy all the time, reduced libido.  She also has vitamin D deficiency and perimenopause. HPI  She says that the progesterone 40 mg every night did seem to help previously in terms of sleep but does not seem to help her as much now.  She found that melatonin did help her in the past and she has not been taking this recently. She also describes decreased libido and previously was taking testosterone therapy but the dose that she was on was not helping her at the time apparently. She is also on desiccated NP thyroid for symptoms of thyroid hypofunction. Past Medical History:  Diagnosis Date  . Anemia   . Anginal pain ([place])    due to asthma meds  . Anxiety state, unspecified 10/12/2007  . Arthritis   . Asthma   . CHEST PAIN, ATYPICAL 02/18/2010   after using albuterol  . CHEST PAIN-PRECORDIAL 02/19/2010   after albuterol  . CHF (congestive heart failure) ([place]) 1999   During Pregnancy, After C section  . COPD (chronic obstructive pulmonary disease) ([place])   . Diverticulosis   . FATIGUE 09/28/2007  . Fatty liver   . GASTROENTERITIS 10/03/2008  . GERD (gastroesophageal reflux disease)    occ takes OTC ttums  . History of vitamin D deficiency   . Hypothyroidism   . MIGRAINE HEADACHE 03/04/2010  . Obesity   . PEDAL EDEMA 02/18/2010   comes and goes   . PEPTIC ULCER DISEASE 09/28/2007  . 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 PONV (postoperative nausea and vomiting)   . SEIZURE DISORDER  09/28/2007  . SUI (stress urinary incontinence, female)   . Thyroid nodule   . Tobacco abuse   . WEIGHT GAIN 09/28/2007   Past Surgical History:  Procedure Laterality Date  . ANKLE SURGERY     left  . APPENDECTOMY    . CARDIAC CATHETERIZATION  2015  . CESAREAN SECTION     x3  . CHOLECYSTECTOMY    . DILITATION & CURRETTAGE/HYSTROSCOPY WITH NOVASURE ABLATION N/A 12/26/2015   Procedure: DILATATION & CURETTAGE/HYSTEROSCOPY WITH ENDOMETRIAL ABLATION;  Surgeon: Jonnie Kind, MD;  Location: [facility];  Service: Gynecology;  Laterality: N/A;  . ESOPHAGOGASTRODUODENOSCOPY  2012  . KNEE ARTHROSCOPY Left    x2  . MOUTH LESION EXCISIONAL BIOPSY  04/2011   pre-cancerous, pallet of mouth  . SHOULDER ARTHROSCOPY WITH ROTATOR CUFF REPAIR Right 08/16/2019   Procedure: Right shoulder arthroscopy, subacromial decompression, distal clavicle resection, rotator cuff repair;  Surgeon: Justice Britain, MD;  Location: [facility];  Service: Orthopedics;  Laterality: Right;  136min  . SHOULDER ARTHROSCOPY WITH ROTATOR CUFF REPAIR Right 10/02/2019   Procedure: SHOULDER ARTHROSCOPY WITH ROTATOR CUFF REPAIR;  Surgeon: Justice Britain, MD;  Location: [facility];  Service: Orthopedics;  Laterality: Right;  80min     Family History  Problem Relation Age of Onset  . Cerebral aneurysm Mother   . Colon cancer Mother   . Heart disease Father   . Liver disease Father   .  Diabetes Brother 1       died age 57  . Kidney disease Maternal Grandmother   . Colon cancer Maternal Grandmother   . Colon cancer Maternal Aunt   . Diabetes Sister     Social History   Social History Narrative  . Not on file   Social History   Tobacco Use  . Smoking status: Current Every Day Smoker    Packs/day: 1.00    Years: 15.00    Pack years: 15.00    Types: Cigarettes  . Smokeless tobacco: Never Used  Substance Use Topics  . Alcohol use: Not Currently    Comment: occ    Current Meds  Medication Sig  . DULoxetine (CYMBALTA) 30 MG  capsule TAKE 3 CAPSULES BY MOUTH EVERY DAY AT BEDTIME  . ipratropium-albuterol (DUONEB) 0.5-2.5 (3) MG/3ML SOLN Take 3 mLs by nebulization every 6 (six) hours as needed.  . levalbuterol (XOPENEX HFA) 45 MCG/ACT inhaler Inhale 1-2 puffs into the lungs daily as needed for wheezing or shortness of breath.  . naproxen (NAPROSYN) 500 MG tablet Take 1 tablet (500 mg total) by mouth 2 (two) times daily with a meal.  . NONFORMULARY OR COMPOUNDED ITEM Inject 10 mg into the muscle 2 (two) times a week. Testosterone cypionate in olive  oil (25 mg/mL).  Dispense 5 mL vial.  . NP THYROID 60 MG tablet TAKE 1 TABLET(60 MG) BY MOUTH DAILY BEFORE AND BREAKFAST  . NP THYROID 90 MG tablet Take 1 tablet (90 mg total) by mouth daily.  . progesterone (PROMETRIUM) 200 MG capsule Take 2 capsules (400 mg total) by mouth at bedtime.      Depression screen [facility] 2/9 04/23/2020  Decreased Interest 0  Down, Depressed, Hopeless 0  PHQ - 2 Score 0     Objective:   Today's Vitals: BP 128/74   Pulse (!) 108   Temp (!) 96.8 F (36 C) (Temporal)   Ht 5\' 7"  (1.702 m)   Wt 186 lb 9.6 oz (84.6 kg)   SpO2 96%   BMI 29.23 kg/m  Vitals with BMI 05/12/2020 04/23/2020 10/25/2019  Height 5\' 7"  5\' 7"  5\' 7"   Weight 186 lbs 10 oz 186 lbs 183 lbs 6 oz  BMI 29.22 80.99 83.38  Systolic 250 539 767  Diastolic 74 80 85  Pulse 341 78 67     Physical Exam  She look systemically well.  Blood pressure is in good range.     Assessment   1. B12 deficiency   2. Vitamin D deficiency disease   3. Hypoactive sexual desire disorder   4. Malaise and fatigue   5. Perimenopause   6. Insomnia, unspecified type       Tests ordered No orders of the defined types were placed in this encounter.    Plan: 1. We discussed her imaging in more detail.  She says the B12 injections of helped her in the past and I am not opposed to this.  We discussed the use of phentermine and I would not recommend this in she is agreeable after  discussion.  I think we can first optimize thyroid and I have sent a new prescription for NP thyroid 90 mg daily.  This is being used off label for symptoms of thyroid hypofunction. 2. In terms of her perimenopausal symptoms and decreased libido, I am going to reinitiate testosterone injections twice a week better to higher dose.  She was taking testosterone 5 mg twice a week but I have  increased it now to 10 mg twice a week.  I have called his prescriptions into [facility] in [place]. 3. She will continue with progesterone 40 mg at night but I am not opposed to increasing the dose to 600 mg at night. 4. In terms of her insomnia, I have recommended that she take melatonin from [facility] and to use the dose that is actually effective for sleep.  This may be a higher dose. 5. I will see her in the next 4 to 6 weeks to see how she is doing and we may need blood work at that time.   Meds ordered this encounter  Medications  . NP THYROID 90 MG tablet    Sig: Take 1 tablet (90 mg total) by mouth daily.    Dispense:  30 tablet    Refill:  3  . NONFORMULARY OR COMPOUNDED ITEM    Sig: Inject 10 mg into the muscle 2 (two) times a week. Testosterone cypionate in olive  oil (25 mg/mL).  Dispense 5 mL vial.    Dispense:  1 each    Refill:  3    Jewelz Ricklefs Luther Parody, MD

## 2020-05-19 ENCOUNTER — Other Ambulatory Visit (INDEPENDENT_AMBULATORY_CARE_PROVIDER_SITE_OTHER): Payer: Self-pay | Admitting: Internal Medicine

## 2020-05-19 MED ORDER — PROGESTERONE 200 MG PO CAPS: 600.0000 mg | ORAL_CAPSULE | Freq: Every evening | ORAL | 3 refills | Status: DC

## 2020-05-25 ENCOUNTER — Encounter (INDEPENDENT_AMBULATORY_CARE_PROVIDER_SITE_OTHER): Payer: Self-pay | Admitting: Internal Medicine

## 2020-05-27 ENCOUNTER — Other Ambulatory Visit (INDEPENDENT_AMBULATORY_CARE_PROVIDER_SITE_OTHER): Payer: Self-pay | Admitting: Internal Medicine

## 2020-05-27 DIAGNOSIS — U071 COVID-19: Secondary | ICD-10-CM

## 2020-05-28 ENCOUNTER — Telehealth (HOSPITAL_COMMUNITY): Payer: Self-pay | Admitting: Pharmacist

## 2020-05-28 ENCOUNTER — Encounter: Payer: Self-pay | Admitting: Family

## 2020-05-28 ENCOUNTER — Other Ambulatory Visit (HOSPITAL_COMMUNITY): Payer: Self-pay | Admitting: Family

## 2020-05-28 ENCOUNTER — Telehealth (HOSPITAL_COMMUNITY): Payer: Self-pay | Admitting: Adult Health

## 2020-05-28 DIAGNOSIS — U071 COVID-19: Secondary | ICD-10-CM

## 2020-05-28 MED ORDER — NIRMATRELVIR/RITONAVIR (PAXLOVID)TABLET: 3.0000 | ORAL_TABLET | Freq: Two times a day (BID) | ORAL | 0 refills | Status: AC

## 2020-05-28 MED FILL — PAXLOVID 20 X 150 MG & 10 X: 20 X 150 MG | 5 days supply | Qty: 30 | Fill #0

## 2020-05-28 NOTE — Addendum Note (Signed)
Addended by: Loel Dubonnet on: 05/28/2020 03:14 PM   Modules accepted: Orders

## 2020-05-28 NOTE — Telephone Encounter (Signed)
Called to discuss with patient about COVID-19 symptoms and the use of one of the available treatments for those with mild to moderate Covid symptoms and at a high risk of hospitalization.  Pt appears to qualify for outpatient treatment due to co-morbid conditions and/or a member of an at-risk group in accordance with the FDA Emergency Use Authorization.    Unable to reach pt - LMOM, my chart sent   Scot Dock

## 2020-05-28 NOTE — Telephone Encounter (Signed)
Outpatient Oral COVID Treatment Note  I connected with Lisa Ryan on 05/28/2020/3:12 PM by telephone and verified that I am speaking with the correct person using two identifiers.  I discussed the limitations, risks, security, and privacy concerns of performing an evaluation and management service by telephone and the availability of in person appointments. I also discussed with the patient that there may be a patient responsible charge related to this service. The patient expressed understanding and agreed to proceed.  Patient location: [place] residence Provider location: residence  Diagnosis: COVID-19 infection  Purpose of visit: Discussion of potential use of Molnupiravir or Paxlovid, a new treatment for mild to moderate COVID-19 viral infection in non-hospitalized patients.   Subjective: Patient is a 45 y.o. female who has been diagnosed with COVID 19 viral infection.  Their symptoms began on 05/24/20 with fatigue.    Past Medical History:  Diagnosis Date  . Anemia   . Anginal pain ([place])    due to asthma meds  . Anxiety state, unspecified 10/12/2007  . Arthritis   . Asthma   . CHEST PAIN, ATYPICAL 02/18/2010   after using albuterol  . CHEST PAIN-PRECORDIAL 02/19/2010   after albuterol  . CHF (congestive heart failure) ([place]) 1999   During Pregnancy, After C section  . COPD (chronic obstructive pulmonary disease) ([place])   . Diverticulosis   . FATIGUE 09/28/2007  . Fatty liver   . GASTROENTERITIS 10/03/2008  . GERD (gastroesophageal reflux disease)    occ takes OTC ttums  . History of vitamin D deficiency   . Hypothyroidism   . MIGRAINE HEADACHE 03/04/2010  . Obesity   . PEDAL EDEMA 02/18/2010   comes and goes   . PEPTIC ULCER DISEASE 09/28/2007  . 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 PONV (postoperative nausea and vomiting)   . SEIZURE DISORDER 09/28/2007  . SUI (stress urinary incontinence, female)   . Thyroid nodule   . Tobacco abuse   . WEIGHT GAIN  09/28/2007    Allergies  Allergen Reactions  . Albuterol Anxiety and Other (See Comments)    Increased HR 130s (pt tolerates duoneb)  . Hydrocodone-Acetaminophen Nausea And Vomiting  . Morphine Other (See Comments)    hallucinations  . Adhesive [Tape] Rash    Paper tape is ok     Current Outpatient Medications:  .  nirmatrelvir/ritonavir EUA (PAXLOVID) TABS, Take 3 tablets by mouth 2 (two) times daily for 5 days. Take nirmatrelvir (150 mg) two tablet(s) twice daily for 5 days and ritonavir (100 mg) one tablet twice daily for 5 days., Disp: 30 tablet, Rfl: 0 .  DULoxetine (CYMBALTA) 30 MG capsule, TAKE 3 CAPSULES BY MOUTH EVERY DAY AT BEDTIME, Disp: 90 capsule, Rfl: 3 .  ipratropium-albuterol (DUONEB) 0.5-2.5 (3) MG/3ML SOLN, Take 3 mLs by nebulization every 6 (six) hours as needed., Disp: 360 mL, Rfl: 0 .  levalbuterol (XOPENEX HFA) 45 MCG/ACT inhaler, Inhale 1-2 puffs into the lungs daily as needed for wheezing or shortness of breath., Disp: , Rfl:  .  naproxen (NAPROSYN) 500 MG tablet, Take 1 tablet (500 mg total) by mouth 2 (two) times daily with a meal., Disp: 60 tablet, Rfl: 1 .  NONFORMULARY OR COMPOUNDED ITEM, Inject 10 mg into the muscle 2 (two) times a week. Testosterone cypionate in olive  oil (25 mg/mL).  Dispense 5 mL vial., Disp: 1 each, Rfl: 3 .  NP THYROID 60 MG tablet, TAKE 1 TABLET(60 MG) BY MOUTH DAILY BEFORE AND BREAKFAST, Disp: 30 tablet, Rfl: 3 .  NP THYROID 90 MG tablet, Take 1 tablet (90 mg total) by mouth daily., Disp: 30 tablet, Rfl: 3 .  progesterone (PROMETRIUM) 200 MG capsule, Take 3 capsules (600 mg total) by mouth at bedtime., Disp: 90 capsule, Rfl: 3  Objective: Patient appears/sounds well.  They are in no apparent distress.  Breathing is non labored.  Mood and behavior are normal.  Laboratory Data:  Recent Results (from the past 2160 hour(s))  CBC     Status: Abnormal   Collection Time: 04/23/20 11:30 AM  Result Value Ref Range   WBC 6.8 3.8 - 10.8  Thousand/uL   RBC 4.14 3.80 - 5.10 Million/uL   Hemoglobin 14.2 11.7 - 15.5 g/dL   HCT 40.5 35.0 - 45.0 %   MCV 97.8 80.0 - 100.0 fL   MCH 34.3 (H) 27.0 - 33.0 pg   MCHC 35.1 32.0 - 36.0 g/dL   RDW 11.7 11.0 - 15.0 %   Platelets 325 140 - 400 Thousand/uL   MPV 10.4 7.5 - 12.5 fL  COMPLETE METABOLIC PANEL WITH GFR     Status: None   Collection Time: 04/23/20 11:30 AM  Result Value Ref Range   Glucose, Bld 75 65 - 139 mg/dL    Comment: .        Non-fasting reference interval .    BUN 15 7 - 25 mg/dL   Creat 0.64 0.50 - 1.10 mg/dL   GFR, Est Non African American 107 > OR = 60 mL/min/1.1m2   GFR, Est African American 124 > OR = 60 mL/min/1.52m2   BUN/Creatinine Ratio NOT APPLICABLE 6 - 22 (calc)   Sodium 138 135 - 146 mmol/L   Potassium 4.7 3.5 - 5.3 mmol/L   Chloride 105 98 - 110 mmol/L   CO2 24 20 - 32 mmol/L   Calcium 9.2 8.6 - 10.2 mg/dL   Total Protein 6.2 6.1 - 8.1 g/dL   Albumin 4.3 3.6 - 5.1 g/dL   Globulin 1.9 1.9 - 3.7 g/dL (calc)   AG Ratio 2.3 1.0 - 2.5 (calc)   Total Bilirubin 0.3 0.2 - 1.2 mg/dL   Alkaline phosphatase (APISO) 71 31 - 125 U/L   AST 13 10 - 35 U/L   ALT 21 6 - 29 U/L  Hemoglobin A1c     Status: None   Collection Time: 04/23/20 11:30 AM  Result Value Ref Range   Hgb A1c MFr Bld 5.1 <5.7 % of total Hgb    Comment: For the purpose of screening for the presence of diabetes: . <5.7%       Consistent with the absence of diabetes 5.7-6.4%    Consistent with increased risk for diabetes             (prediabetes) > or =6.5%  Consistent with diabetes . This assay result is consistent with a decreased risk of diabetes. . Currently, no consensus exists regarding use of hemoglobin A1c for diagnosis of diabetes in children. . According to American Diabetes Association (ADA) guidelines, hemoglobin A1c <7.0% represents optimal control in non-pregnant diabetic patients. Different metrics may apply to specific patient populations.  Standards of Medical  Care in Diabetes(ADA). .    Mean Plasma Glucose 100 mg/dL   eAG (mmol/L) 5.5 mmol/L  Follicle stimulating hormone     Status: None   Collection Time: 04/23/20 11:30 AM  Result Value Ref Range   FSH 15.9 mIU/mL    Comment:  Reference Range .              Follicular Phase       7.6-73.4              Mid-cycle Peak         3.1-17.7              Luteal Phase           1.5- 9.1              Postmenopausal       23.0-116.3              .   Lipid panel     Status: Abnormal   Collection Time: 04/23/20 11:30 AM  Result Value Ref Range   Cholesterol 160 <200 mg/dL   HDL 44 (L) > OR = 50 mg/dL   Triglycerides 95 <150 mg/dL   LDL Cholesterol (Calc) 97 mg/dL (calc)    Comment: Reference range: <100 . Desirable range <100 mg/dL for primary prevention;   <70 mg/dL for patients with CHD or diabetic patients  with > or = 2 CHD risk factors. Marland Kitchen LDL-C is now calculated using the Martin-Hopkins  calculation, which is a validated novel method providing  better accuracy than the Friedewald equation in the  estimation of LDL-C.  Cresenciano Genre et al. Annamaria Helling. 1937;902(40): 2061-2068  (http://education.[facility].com/faq/FAQ164)    Total CHOL/HDL Ratio 3.6 <5.0 (calc)   Non-HDL Cholesterol (Calc) 116 <130 mg/dL (calc)    Comment: For patients with diabetes plus 1 major ASCVD risk  factor, treating to a non-HDL-C goal of <100 mg/dL  (LDL-C of <70 mg/dL) is considered a therapeutic  option.   Progesterone     Status: None   Collection Time: 04/23/20 11:30 AM  Result Value Ref Range   Progesterone 43.1 ng/mL    Comment:             Reference Ranges          Female             Follicular Phase     < 1.0             Luteal Phase      2.6-21.5             Post menopausal      < 0.5             Pregnancy             1st Trimester     4.1-34.0             2nd Trimester    24.0-76.0             3rd Trimester   52.0-302.0   T3, free     Status: Abnormal   Collection Time:  04/23/20 11:30 AM  Result Value Ref Range   T3, Free 4.9 (H) 2.3 - 4.2 pg/mL  TSH     Status: None   Collection Time: 04/23/20 11:30 AM  Result Value Ref Range   TSH 0.65 mIU/L    Comment:           Reference Range .           > or = 20 Years  0.40-4.50 .                Pregnancy Ranges           First trimester    0.26-2.66  Second trimester   0.55-2.73           Third trimester    0.43-2.91   VITAMIN D 25 Hydroxy (Vit-D Deficiency, Fractures)     Status: Abnormal   Collection Time: 04/23/20 11:30 AM  Result Value Ref Range   Vit D, 25-Hydroxy 21 (L) 30 - 100 ng/mL    Comment: Vitamin D Status         25-OH Vitamin D: . Deficiency:                    <20 ng/mL Insufficiency:             20 - 29 ng/mL Optimal:                 > or = 30 ng/mL . For 25-OH Vitamin D testing on patients on  D2-supplementation and patients for whom quantitation  of D2 and D3 fractions is required, the QuestAssureD(TM) 25-OH VIT D, (D2,D3), LC/MS/MS is recommended: order  code [phone] (patients >74yrs). See Note 1 . Note 1 . For additional information, please refer to  http://education.[facility].com/faq/FAQ199  (This link is being provided for informational/ educational purposes only.)   Testos,Total,Free and SHBG (Female)     Status: None   Collection Time: 04/23/20 11:30 AM  Result Value Ref Range   Testosterone, Total, LC-MS-MS 21 2 - 45 ng/dL    Comment: . For additional information, please refer to http://education.quest[facility].com/faq/ TotalTestosteroneLCMSMSFAQ165 (This link is being provided for informational/ educational purposes only.) . This test was developed and its analytical performance characteristics have been determined by [place], [place]. It has not been cleared or approved by the U.S. Food and Drug Administration. This assay has been validated pursuant to the CLIA regulations and is used for  clinical purposes. .    Free Testosterone 2.7 0.1 - 6.4 pg/mL    Comment: . This test was developed and its analytical performance characteristics have been determined by [place], [place]. It has not been cleared or approved by the U.S. Food and Drug Administration. This assay has been validated pursuant to the CLIA regulations and is used for clinical purposes. .    Sex Hormone Binding 48 17 - 124 nmol/L     Assessment: 45 y.o. female with mild/moderate COVID 19 viral infection diagnosed on 05/26/20 at high risk for progression to severe COVID 19.  Plan:  This patient is a 45 y.o. female that meets the following criteria for Emergency Use Authorization of: Paxlovid 1. Age >12 yr AND > 40 kg 2. SARS-COV-2 positive test 3. Symptom onset < 5 days 4. Mild-to-moderate COVID disease with high risk for severe progression to hospitalization or death  I have spoken and communicated the following to the patient or parent/caregiver regarding: 1. Paxlovid is an unapproved drug that is authorized for use under an Emergency Use Authorization.  2. There are no adequate, approved, available products for the treatment of COVID-19 in adults who have mild-to-moderate COVID-19 and are at high risk for progressing to severe COVID-19, including hospitalization or death. 3. Other therapeutics are currently authorized. For additional information on all products authorized for treatment or prevention of COVID-19, please see [employer].  4. There are benefits and risks of taking this treatment as outlined in the "Fact Sheet for Patients and Caregivers."  5. "Fact Sheet for Patients and Caregivers" was reviewed with patient. A hard copy will be provided to patient from pharmacy prior to the  patient receiving treatment. 6. Patients should continue to self-isolate and  use infection control measures (e.g., wear mask, isolate, social distance, avoid sharing personal items, clean and disinfect "high touch" surfaces, and frequent handwashing) according to CDC guidelines.  7. The patient or parent/caregiver has the option to accept or refuse treatment. 8. Patient medication history was reviewed for potential drug interactions:No drug interactions 9. Patient's creatinine clearance was calculated to be 123 mL/min, and they were therefore prescribed Normal dose (CrCl>60) - nirmatrelvir 150mg  tab (2 tablet) by mouth twice daily AND ritonavir 100mg  tab (1 tablet) by mouth twice daily   After reviewing above information with the patient, the patient agrees to receive Paxlovid.  Follow up instructions:    . Take prescription BID x 5 days as directed . Reach out to pharmacist for counseling on medication if desired . For concerns regarding further COVID symptoms please follow up with your PCP or urgent care . For urgent or life-threatening issues, seek care at your local emergency department  The patient was provided an opportunity to ask questions, and all were answered. The patient agreed with the plan and demonstrated an understanding of the instructions.   Script sent to [facility] and opted to pick up RX.  The patient was advised to call their PCP or seek an in-person evaluation if the symptoms worsen or if the condition fails to improve as anticipated.   I provided 15 minutes of non face-to-face telephone visit time during this encounter, and > 50% was spent counseling as documented under my assessment & plan.  Loel Dubonnet, NP 05/28/2020 /3:12 PM

## 2020-05-28 NOTE — Telephone Encounter (Signed)
Patient was prescribed oral covid treatment PAXLOVID and treatment note was reviewed. Medication has been received by [place] and reviewed for appropriateness. (drd)  Drug Interactions or Dosage Adjustments Noted: NO ADJUSTMENTS NEEDED  Delivery Method: PT PICK UP  Patient contacted for counseling on 05/28/20 and verbalized understanding.   Delivery or Pick-Up Date: 05/28/20   Marye Round 05/28/2020, 4:06 PM [facility] Outpatient Pharmacist Phone# [phone]

## 2020-05-30 ENCOUNTER — Encounter (INDEPENDENT_AMBULATORY_CARE_PROVIDER_SITE_OTHER): Payer: Self-pay | Admitting: Internal Medicine

## 2020-06-02 ENCOUNTER — Encounter (INDEPENDENT_AMBULATORY_CARE_PROVIDER_SITE_OTHER): Payer: Self-pay | Admitting: Internal Medicine

## 2020-06-16 ENCOUNTER — Other Ambulatory Visit: Payer: Self-pay

## 2020-06-16 ENCOUNTER — Encounter (INDEPENDENT_AMBULATORY_CARE_PROVIDER_SITE_OTHER): Payer: Self-pay | Admitting: Internal Medicine

## 2020-06-16 ENCOUNTER — Ambulatory Visit (INDEPENDENT_AMBULATORY_CARE_PROVIDER_SITE_OTHER): Payer: 59 | Admitting: Internal Medicine

## 2020-06-16 VITALS — BP 120/74 | HR 72 | Ht 67.0 in | Wt 188.2 lb

## 2020-06-16 DIAGNOSIS — R5383 Other fatigue: Secondary | ICD-10-CM | POA: Diagnosis not present

## 2020-06-16 DIAGNOSIS — F520 Hypoactive sexual desire disorder: Secondary | ICD-10-CM

## 2020-06-16 DIAGNOSIS — N951 Menopausal and female climacteric states: Secondary | ICD-10-CM

## 2020-06-16 DIAGNOSIS — R5381 Other malaise: Secondary | ICD-10-CM

## 2020-06-16 NOTE — Progress Notes (Signed)
Metrics: Intervention Frequency ACO  Documented Smoking Status Yearly  Screened one or more times in 24 months  Cessation Counseling or  Active cessation medication Past 24 months  Past 24 months   Guideline developer: UpToDate (See UpToDate for funding source) Date Released: 2014       Wellness Office Visit  Subjective:  Patient ID: Lisa Ryan, female    DOB: 05/23/1975  Age: 45 y.o. MRN: [id number]  CC: This lady comes in for follow-up of perimenopausal symptoms, extreme fatigue, hypoactive sexual desire disorder. HPI  She has tolerated higher dose of testosterone and also higher dose of NP thyroid.  She is also tolerated the higher dose of progesterone.  Overall, she does feel that she has more energy but still feels that in the afternoon she is lacking energy and feels sleepy.  She has been going through a lot of stressful situations with her adopted daughter who is approximately 3 months postpartum and is having complications. Past Medical History:  Diagnosis Date  . Anemia   . Anginal pain ([place])    due to asthma meds  . Anxiety state, unspecified 10/12/2007  . Arthritis   . Asthma   . CHEST PAIN, ATYPICAL 02/18/2010   after using albuterol  . CHEST PAIN-PRECORDIAL 02/19/2010   after albuterol  . CHF (congestive heart failure) ([place]) 1999   During Pregnancy, After C section  . COPD (chronic obstructive pulmonary disease) ([place])   . Diverticulosis   . FATIGUE 09/28/2007  . Fatty liver   . GASTROENTERITIS 10/03/2008  . GERD (gastroesophageal reflux disease)    occ takes OTC ttums  . History of vitamin D deficiency   . Hypothyroidism   . MIGRAINE HEADACHE 03/04/2010  . Obesity   . PEDAL EDEMA 02/18/2010   comes and goes   . PEPTIC ULCER DISEASE 09/28/2007  . 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 PONV (postoperative nausea and vomiting)   . SEIZURE DISORDER 09/28/2007  . SUI (stress urinary incontinence, female)   . Thyroid nodule   . Tobacco  abuse   . WEIGHT GAIN 09/28/2007   Past Surgical History:  Procedure Laterality Date  . ANKLE SURGERY     left  . APPENDECTOMY    . CARDIAC CATHETERIZATION  2015  . CESAREAN SECTION     x3  . CHOLECYSTECTOMY    . DILITATION & CURRETTAGE/HYSTROSCOPY WITH NOVASURE ABLATION N/A 12/26/2015   Procedure: DILATATION & CURETTAGE/HYSTEROSCOPY WITH ENDOMETRIAL ABLATION;  Surgeon: Jonnie Kind, MD;  Location: [facility];  Service: Gynecology;  Laterality: N/A;  . ESOPHAGOGASTRODUODENOSCOPY  2012  . KNEE ARTHROSCOPY Left    x2  . MOUTH LESION EXCISIONAL BIOPSY  04/2011   pre-cancerous, pallet of mouth  . SHOULDER ARTHROSCOPY WITH ROTATOR CUFF REPAIR Right 08/16/2019   Procedure: Right shoulder arthroscopy, subacromial decompression, distal clavicle resection, rotator cuff repair;  Surgeon: Justice Britain, MD;  Location: [facility];  Service: Orthopedics;  Laterality: Right;  170min  . SHOULDER ARTHROSCOPY WITH ROTATOR CUFF REPAIR Right 10/02/2019   Procedure: SHOULDER ARTHROSCOPY WITH ROTATOR CUFF REPAIR;  Surgeon: Justice Britain, MD;  Location: [facility];  Service: Orthopedics;  Laterality: Right;  80min     Family History  Problem Relation Age of Onset  . Cerebral aneurysm Mother   . Colon cancer Mother   . Heart disease Father   . Liver disease Father   . Diabetes Brother 1       died age 62  . Kidney disease Maternal Grandmother   .  Colon cancer Maternal Grandmother   . Colon cancer Maternal Aunt   . Diabetes Sister     Social History   Social History Narrative  . Not on file   Social History   Tobacco Use  . Smoking status: Current Every Day Smoker    Packs/day: 1.00    Years: 15.00    Pack years: 15.00    Types: Cigarettes  . Smokeless tobacco: Never Used  Substance Use Topics  . Alcohol use: Not Currently    Comment: occ    Current Meds  Medication Sig  . Cholecalciferol (VITAMIN D3) 125 MCG (5000 UT) TABS Take 2 tablets by mouth daily.  Marland Kitchen DHEA 50 MG CAPS Take 50 mg by mouth  daily at 12 noon.  . DULoxetine (CYMBALTA) 30 MG capsule TAKE 3 CAPSULES BY MOUTH EVERY DAY AT BEDTIME  . ipratropium-albuterol (DUONEB) 0.5-2.5 (3) MG/3ML SOLN Take 3 mLs by nebulization every 6 (six) hours as needed.  . levalbuterol (XOPENEX HFA) 45 MCG/ACT inhaler Inhale 1-2 puffs into the lungs daily as needed for wheezing or shortness of breath.  . Melatonin 10 MG TABS Take 20 mg by mouth at bedtime.  . naproxen (NAPROSYN) 500 MG tablet Take 1 tablet (500 mg total) by mouth 2 (two) times daily with a meal.  . NONFORMULARY OR COMPOUNDED ITEM Inject 10 mg into the muscle 2 (two) times a week. Testosterone cypionate in olive  oil (25 mg/mL).  Dispense 5 mL vial.  . NP THYROID 90 MG tablet Take 1 tablet (90 mg total) by mouth daily.  . progesterone (PROMETRIUM) 200 MG capsule Take 3 capsules (600 mg total) by mouth at bedtime.  . vitamin B-12 (CYANOCOBALAMIN) 1000 MCG tablet Take 1,000 mcg by mouth daily.       Objective:   Today's Vitals: BP 120/74   Pulse 72   Ht 5\' 7"  (1.702 m)   Wt 188 lb 3.2 oz (85.4 kg)   BMI 29.48 kg/m  Vitals with BMI 06/16/2020 05/12/2020 04/23/2020  Height 5\' 7"  5\' 7"  5\' 7"   Weight 188 lbs 3 oz 186 lbs 10 oz 186 lbs  BMI 29.47 93.26 71.24  Systolic 580 998 338  Diastolic 74 74 80  Pulse 72 108 78     Physical Exam   She looks systemically well.  Blood pressure excellent.  Overweight.    Assessment   1. Hypoactive sexual desire disorder   2. Perimenopause   3. Malaise and fatigue       Tests ordered No orders of the defined types were placed in this encounter.    Plan: 1. She will continue with the higher dose of testosterone 10 mg intramuscular twice a week. 2. She will continue with progesterone higher dose as before. 3. In terms of her thyroid treatment, I think she will benefit from optimization.  I have told her to increase NP thyroid from 90 mg once a day to eventually NP thyroid 90 mg twice a day.  She has NP thyroid 30 mg  tablets and she will gradually take NP thyroid 30 mg in the afternoon, increasing to NP thyroid 60 mg in the afternoon and eventually to NP thyroid 90 mg in the afternoon.  She will eventually reach a dose of NP thyroid 90 mg twice a day as long as she tolerates this.  She is aware of side effects to look for. 4. Follow-up in about 6 weeks to see how she is doing.   No orders of the  defined types were placed in this encounter.   Doree Albee, MD

## 2020-06-21 ENCOUNTER — Encounter (INDEPENDENT_AMBULATORY_CARE_PROVIDER_SITE_OTHER): Payer: Self-pay | Admitting: Internal Medicine

## 2020-06-22 ENCOUNTER — Other Ambulatory Visit (INDEPENDENT_AMBULATORY_CARE_PROVIDER_SITE_OTHER): Payer: Self-pay | Admitting: Internal Medicine

## 2020-06-22 MED ORDER — NP THYROID 30 MG PO TABS: 30.0000 mg | ORAL_TABLET | Freq: Two times a day (BID) | ORAL | 1 refills | Status: DC

## 2020-07-28 ENCOUNTER — Ambulatory Visit (INDEPENDENT_AMBULATORY_CARE_PROVIDER_SITE_OTHER): Payer: 59 | Admitting: Internal Medicine

## 2020-07-30 ENCOUNTER — Other Ambulatory Visit: Payer: Self-pay

## 2020-07-30 ENCOUNTER — Ambulatory Visit (INDEPENDENT_AMBULATORY_CARE_PROVIDER_SITE_OTHER): Payer: 59 | Admitting: Internal Medicine

## 2020-07-30 ENCOUNTER — Encounter (INDEPENDENT_AMBULATORY_CARE_PROVIDER_SITE_OTHER): Payer: Self-pay | Admitting: Internal Medicine

## 2020-07-30 VITALS — BP 128/67 | HR 87 | Temp 97.6°F | Resp 18 | Ht 67.0 in | Wt 187.0 lb

## 2020-07-30 DIAGNOSIS — R0683 Snoring: Secondary | ICD-10-CM

## 2020-07-30 DIAGNOSIS — E041 Nontoxic single thyroid nodule: Secondary | ICD-10-CM

## 2020-07-30 DIAGNOSIS — R5381 Other malaise: Secondary | ICD-10-CM

## 2020-07-30 DIAGNOSIS — N951 Menopausal and female climacteric states: Secondary | ICD-10-CM | POA: Diagnosis not present

## 2020-07-30 DIAGNOSIS — G43809 Other migraine, not intractable, without status migrainosus: Secondary | ICD-10-CM

## 2020-07-30 DIAGNOSIS — R1084 Generalized abdominal pain: Secondary | ICD-10-CM

## 2020-07-30 DIAGNOSIS — R5383 Other fatigue: Secondary | ICD-10-CM

## 2020-07-30 DIAGNOSIS — F520 Hypoactive sexual desire disorder: Secondary | ICD-10-CM | POA: Diagnosis not present

## 2020-07-30 NOTE — Progress Notes (Signed)
Metrics: Intervention Frequency ACO  Documented Smoking Status Yearly  Screened one or more times in 24 months  Cessation Counseling or  Active cessation medication Past 24 months  Past 24 months   Guideline developer: UpToDate (See UpToDate for funding source) Date Released: 2014       Wellness Office Visit  Subjective:  Patient ID: Lisa Ryan, female    DOB: 09-Sep-1975  Age: 45 y.o. MRN: [id number]  CC: This lady comes in for follow-up of hypoactive sexual desire disorder, full malaise and fatigue, perimenopause symptoms. HPI  She says she still feels constantly fatigued throughout the day and feels like she wants to sleep.  She did admit that her husband tells her that she does snore at night. She also describes quite significant headaches more recently.  She has had a history of migrainous headaches. She is also describing generalized abdominal pain which is fairly severe.  She did have a CT scan a couple of years ago which showed fatty liver disease and diverticular disease but she does not seem to describe symptoms of diverticular disease at the present time. She also wanted to follow-up on history of thyroid nodule.  We did do an ultrasound previously which did not show up a nodule but she is still concerned about it.  She is really afraid of the possibility of cancer. She has tolerated the higher dose of NP thyroid 90 mg twice a day.  She takes DHEA 50 mg daily.  She takes testosterone twice a week.  She takes progesterone at night. She describes hot flashes and she did have uterine ablation according to her previously. Past Medical History:  Diagnosis Date  . Anemia   . Anginal pain ([place])    due to asthma meds  . Anxiety state, unspecified 10/12/2007  . Arthritis   . Asthma   . CHEST PAIN, ATYPICAL 02/18/2010   after using albuterol  . CHEST PAIN-PRECORDIAL 02/19/2010   after albuterol  . CHF (congestive heart failure) ([place]) 1999   During Pregnancy, After C section  .  COPD (chronic obstructive pulmonary disease) ([place])   . Diverticulosis   . FATIGUE 09/28/2007  . Fatty liver   . GASTROENTERITIS 10/03/2008  . GERD (gastroesophageal reflux disease)    occ takes OTC ttums  . History of vitamin D deficiency   . Hypothyroidism   . MIGRAINE HEADACHE 03/04/2010  . Obesity   . PEDAL EDEMA 02/18/2010   comes and goes   . PEPTIC ULCER DISEASE 09/28/2007  . 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 PONV (postoperative nausea and vomiting)   . SEIZURE DISORDER 09/28/2007  . SUI (stress urinary incontinence, female)   . Thyroid nodule   . Tobacco abuse   . WEIGHT GAIN 09/28/2007   Past Surgical History:  Procedure Laterality Date  . ANKLE SURGERY     left  . APPENDECTOMY    . CARDIAC CATHETERIZATION  2015  . CESAREAN SECTION     x3  . CHOLECYSTECTOMY    . DILITATION & CURRETTAGE/HYSTROSCOPY WITH NOVASURE ABLATION N/A 12/26/2015   Procedure: DILATATION & CURETTAGE/HYSTEROSCOPY WITH ENDOMETRIAL ABLATION;  Surgeon: Jonnie Kind, MD;  Location: [facility];  Service: Gynecology;  Laterality: N/A;  . ESOPHAGOGASTRODUODENOSCOPY  2012  . KNEE ARTHROSCOPY Left    x2  . MOUTH LESION EXCISIONAL BIOPSY  04/2011   pre-cancerous, pallet of mouth  . SHOULDER ARTHROSCOPY WITH ROTATOR CUFF REPAIR Right 08/16/2019   Procedure: Right shoulder arthroscopy, subacromial decompression, distal clavicle resection, rotator cuff  repair;  Surgeon: Justice Britain, MD;  [facility];  Service: Orthopedics;  Laterality: Right;  127min  . SHOULDER ARTHROSCOPY WITH ROTATOR CUFF REPAIR Right 10/02/2019   Procedure: SHOULDER ARTHROSCOPY WITH ROTATOR CUFF REPAIR;  Surgeon: Justice Britain, MD;  Location: [facility];  Service: Orthopedics;  Laterality: Right;  69min     Family History  Problem Relation Age of Onset  . Cerebral aneurysm Mother   . Colon cancer Mother   . Heart disease Father   . Liver disease Father   . Diabetes Brother 1       died age 41  . Kidney disease  Maternal Grandmother   . Colon cancer Maternal Grandmother   . Colon cancer Maternal Aunt   . Diabetes Sister     Social History   Social History Narrative  . Not on file   Social History   Tobacco Use  . Smoking status: Current Every Day Smoker    Packs/day: 1.00    Years: 15.00    Pack years: 15.00    Types: Cigarettes  . Smokeless tobacco: Never Used  Substance Use Topics  . Alcohol use: Not Currently    Comment: occ    Current Meds  Medication Sig  . Cholecalciferol (VITAMIN D3) 125 MCG (5000 UT) TABS Take 2 tablets by mouth daily.  Marland Kitchen DHEA 50 MG CAPS Take 50 mg by mouth daily at 12 noon.  . DULoxetine (CYMBALTA) 30 MG capsule TAKE 3 CAPSULES BY MOUTH EVERY DAY AT BEDTIME (Patient taking differently: Take 30 mg by mouth daily. TAKE 3 CAPSULES BY MOUTH EVERY DAY AT BEDTIME)  . ipratropium-albuterol (DUONEB) 0.5-2.5 (3) MG/3ML SOLN Take 3 mLs by nebulization every 6 (six) hours as needed.  . levalbuterol (XOPENEX HFA) 45 MCG/ACT inhaler Inhale 1-2 puffs into the lungs daily as needed for wheezing or shortness of breath.  . Melatonin 10 MG TABS Take 20 mg by mouth at bedtime.  . naproxen (NAPROSYN) 500 MG tablet Take 1 tablet (500 mg total) by mouth 2 (two) times daily with a meal.  . NONFORMULARY OR COMPOUNDED ITEM Inject 10 mg into the muscle 2 (two) times a week. Testosterone cypionate in olive  oil (25 mg/mL).  Dispense 5 mL vial.  . NP THYROID 90 MG tablet Take 1 tablet (90 mg total) by mouth daily. (Patient taking differently: Take 90 mg by mouth 2 (two) times daily.)  . progesterone (PROMETRIUM) 200 MG capsule Take 3 capsules (600 mg total) by mouth at bedtime.  . vitamin B-12 (CYANOCOBALAMIN) 1000 MCG tablet Take 1,000 mcg by mouth daily.  . [DISCONTINUED] NP THYROID 30 MG tablet TAKE 1 TABLET(30 MG) BY MOUTH IN THE MORNING AND AT BEDTIME       Objective:   Today's Vitals: BP 128/67 (BP Location: Left Arm, Patient Position: Sitting, Cuff Size: Normal)   Pulse 87    Temp 97.6 F (36.4 C) (Temporal)   Resp 18   Ht 5\' 7"  (1.702 m)   Wt 187 lb (84.8 kg)   SpO2 99%   BMI 29.29 kg/m  Vitals with BMI 07/30/2020 06/16/2020 05/12/2020  Height 5\' 7"  5\' 7"  5\' 7"   Weight 187 lbs 188 lbs 3 oz 186 lbs 10 oz  BMI 29.28 66.29 47.65  Systolic 465 035 465  Diastolic 67 74 74  Pulse 87 72 108     Physical Exam  Her weight is stable and she has not lost further weight.  Blood pressure is in a reasonably good range.  She is alert and orientated without any focal neurological signs.  No obvious thyroid nodules.     Assessment   1. Perimenopause   2. Hypoactive sexual desire disorder   3. Malaise and fatigue   4. Snoring   5. Other migraine without status migrainosus, not intractable   6. Generalized abdominal pain   7. Thyroid nodule       Tests ordered Orders Placed This Encounter  Procedures  . MR BRAIN WO CONTRAST  . US THYROID  . CT Abdomen Pelvis W Contrast  . DHEA-sulfate  . Estradiol  . Progesterone  . T3, free  . T4, free  . TSH  . Testos,Total,Free and SHBG (Female)  . Follicle stimulating hormone  . Ambulatory referral to Neurology     Plan: 1. Continue with all medications and we will check blood levels of all the hormones today. 2. She may have sleep apnea which is why she feels tired/sleepy throughout the day.  I will refer to [facility] neurology for further investigation. 3. I will arrange for her to have MRI brain, ultrasound thyroid and CT scan of the abdomen with the symptoms that she is describing above.  I think she is really concerned about the possibility of malignancy and she wants to make sure as best as she can that she does not have any cancer. 4. Further recommendations will depend on all these results and I will see her in about 3 to 4 months time for follow-up.   No orders of the defined types were placed in this encounter.   Doree Albee, MD

## 2020-08-04 LAB — PROGESTERONE: Progesterone: 86.3 ng/mL

## 2020-08-04 LAB — FOLLICLE STIMULATING HORMONE: FSH: 15.3 m[IU]/mL

## 2020-08-04 LAB — T3, FREE: T3, Free: 6.6 pg/mL — ABNORMAL HIGH (ref 2.3–4.2)

## 2020-08-04 LAB — DHEA-SULFATE: DHEA-SO4: 259 ug/dL — ABNORMAL HIGH (ref 15–205)

## 2020-08-04 LAB — TESTOS,TOTAL,FREE AND SHBG (FEMALE)
Free Testosterone: 6.6 pg/mL — ABNORMAL HIGH (ref 0.1–6.4)
Sex Hormone Binding: 39 nmol/L (ref 17–124)
Testosterone, Total, LC-MS-MS: 53 ng/dL — ABNORMAL HIGH (ref 2–45)

## 2020-08-04 LAB — T4, FREE: Free T4: 1.3 ng/dL (ref 0.8–1.8)

## 2020-08-04 LAB — ESTRADIOL: Estradiol: 115 pg/mL

## 2020-08-04 LAB — TSH: TSH: 0.04 mIU/L — ABNORMAL LOW

## 2020-08-10 ENCOUNTER — Encounter (INDEPENDENT_AMBULATORY_CARE_PROVIDER_SITE_OTHER): Payer: Self-pay | Admitting: Internal Medicine

## 2020-08-14 ENCOUNTER — Other Ambulatory Visit (INDEPENDENT_AMBULATORY_CARE_PROVIDER_SITE_OTHER): Payer: Self-pay | Admitting: Internal Medicine

## 2020-08-14 ENCOUNTER — Encounter (INDEPENDENT_AMBULATORY_CARE_PROVIDER_SITE_OTHER): Payer: Self-pay | Admitting: Internal Medicine

## 2020-08-14 NOTE — Telephone Encounter (Signed)
Pt started again this today. And pt sounds very congested and horse, cough. She had a steroid refill like last time. But need ed another refill for the Z-pak. She said she did text Dr Anastasio Champion, but he did not reply back to her. I notified her he was on vacation. Dr Anastasio Champion will not be back on office until [facility] 4/20.

## 2020-08-14 NOTE — Telephone Encounter (Signed)
She needs to be evaluated before we send in a Z-Pak for her.  If we can work her in for a acute visit next week that is an option, or she can go to urgent care.  I prefer not to just send a antibiotic without seeing the patient as I need to determine how ill she is because she may need further evaluation or diagnostics.

## 2020-08-15 MED ORDER — AZITHROMYCIN 250 MG PO TABS: ORAL_TABLET | ORAL | 0 refills | Status: DC

## 2020-08-19 ENCOUNTER — Telehealth (INDEPENDENT_AMBULATORY_CARE_PROVIDER_SITE_OTHER): Payer: Self-pay

## 2020-08-19 NOTE — Telephone Encounter (Signed)
For all imaging ; CT abd& pev w/o , MRI Rt Shoulder. Does not meet require a Peer to Peer. US Thyroid, MRI's for brainstem & Rt shoulder/  Joint To call [phone].

## 2020-08-22 ENCOUNTER — Ambulatory Visit (HOSPITAL_COMMUNITY)
Admission: RE | Admit: 2020-08-22 | Discharge: 2020-08-22 | Disposition: A | Discharge status: Home / Self Care | Payer: 59 | Source: Ambulatory Visit | Attending: Internal Medicine | Admitting: Internal Medicine

## 2020-08-22 ENCOUNTER — Encounter (HOSPITAL_COMMUNITY): Payer: Self-pay

## 2020-08-22 ENCOUNTER — Encounter (INDEPENDENT_AMBULATORY_CARE_PROVIDER_SITE_OTHER): Payer: Self-pay | Admitting: Internal Medicine

## 2020-08-22 ENCOUNTER — Ambulatory Visit (HOSPITAL_COMMUNITY): Payer: 59

## 2020-08-22 ENCOUNTER — Other Ambulatory Visit: Payer: Self-pay

## 2020-08-22 DIAGNOSIS — E041 Nontoxic single thyroid nodule: Secondary | ICD-10-CM | POA: Insufficient documentation

## 2020-08-22 DIAGNOSIS — G43809 Other migraine, not intractable, without status migrainosus: Secondary | ICD-10-CM | POA: Diagnosis not present

## 2020-08-22 MED ORDER — GADOBUTROL 1 MMOL/ML IV SOLN
8.0000 mL | Freq: Once | INTRAVENOUS | Status: AC | PRN
  Administered 2020-08-22: 8 mL via INTRAVENOUS

## 2020-08-25 ENCOUNTER — Other Ambulatory Visit (INDEPENDENT_AMBULATORY_CARE_PROVIDER_SITE_OTHER): Payer: Self-pay | Admitting: Internal Medicine

## 2020-08-25 DIAGNOSIS — D329 Benign neoplasm of meninges, unspecified: Secondary | ICD-10-CM

## 2020-08-28 ENCOUNTER — Telehealth (INDEPENDENT_AMBULATORY_CARE_PROVIDER_SITE_OTHER): Payer: Self-pay

## 2020-08-28 NOTE — Telephone Encounter (Signed)
Pt called our office stated Dr Prince Rome has not gotten the records via faxed HIM yet? So, I called to verify and to make sure they are there today. Talked with Anne Ng, Dr Prince Rome  off ext:7. She is checking to see where the records would be located . She is not in there system, so checking with whom collects the referrals/faxes.  So it seems to not be getting number to them. I will try to refax  Back to [phone] to Att: Annette. She will call me back to let me know she has note & referral.

## 2020-09-01 NOTE — Telephone Encounter (Signed)
Per request verbal last week to re send to someone else. WE would be sent out again this week. Just to notify if she calls back to find status. They will need a 2-3 weeks to review at new office. So this may be her delay, since we sent to Dr Prince Rome office. They do not accept her insurance. Pt was being referred to [facility]. Please advise if she is called. Unknown which providers.

## 2020-09-17 ENCOUNTER — Encounter (INDEPENDENT_AMBULATORY_CARE_PROVIDER_SITE_OTHER): Payer: Self-pay | Admitting: Internal Medicine

## 2020-09-18 ENCOUNTER — Other Ambulatory Visit (INDEPENDENT_AMBULATORY_CARE_PROVIDER_SITE_OTHER): Payer: Self-pay | Admitting: Internal Medicine

## 2020-09-18 DIAGNOSIS — M2550 Pain in unspecified joint: Secondary | ICD-10-CM

## 2020-09-18 MED ORDER — PREDNISONE 20 MG PO TABS: 40.0000 mg | ORAL_TABLET | Freq: Every day | ORAL | 1 refills | Status: DC

## 2020-09-19 ENCOUNTER — Encounter: Payer: Self-pay | Admitting: Rheumatology

## 2020-09-23 ENCOUNTER — Encounter: Payer: Self-pay | Admitting: Neurology

## 2020-09-23 ENCOUNTER — Ambulatory Visit (INDEPENDENT_AMBULATORY_CARE_PROVIDER_SITE_OTHER): Payer: 59 | Admitting: Neurology

## 2020-09-23 ENCOUNTER — Other Ambulatory Visit: Payer: Self-pay

## 2020-09-23 VITALS — BP 133/88 | HR 74 | Ht 67.0 in | Wt 187.3 lb

## 2020-09-23 DIAGNOSIS — G478 Other sleep disorders: Secondary | ICD-10-CM

## 2020-09-23 DIAGNOSIS — R519 Headache, unspecified: Secondary | ICD-10-CM | POA: Diagnosis not present

## 2020-09-23 DIAGNOSIS — R351 Nocturia: Secondary | ICD-10-CM

## 2020-09-23 DIAGNOSIS — E663 Overweight: Secondary | ICD-10-CM | POA: Diagnosis not present

## 2020-09-23 DIAGNOSIS — R0683 Snoring: Secondary | ICD-10-CM | POA: Diagnosis not present

## 2020-09-23 DIAGNOSIS — G479 Sleep disorder, unspecified: Secondary | ICD-10-CM

## 2020-09-23 DIAGNOSIS — G4719 Other hypersomnia: Secondary | ICD-10-CM

## 2020-09-23 NOTE — Progress Notes (Signed)
Subjective:    Patient ID: Lisa Ryan is a 45 y.o. female.  HPI     Star Age, MD, PhD [facility] [address] [place], [place] [address]  Dear Dr. Anastasio Champion,   I saw your patient, Lisa Ryan, upon your kind request in my sleep clinic today for initial consultation of her sleep disorder, in particular, concern for underlying obstructive sleep apnea.  The patient is unaccompanied today.  As you know, Lisa Ryan is a 45 year old right-handed woman with an underlying medical history of reflux disease, migraine headaches, history of seizure, CHF, COPD, diverticulosis, anxiety, asthma, arthritis, anemia, stress incontinence, hypothyroidism, vitamin D deficiency, and overweight state, who reports snoring and excessive daytime somnolence as well as sleep disruption.  I reviewed your office note from 07/30/2020.  Her Epworth sleepiness score is 7/24, fatigue severity score is 57 out of 63.   You recently ordered a brain MRI. She had a brain MRI with and without contrast on 08/22/2020 and I reviewed the results: IMPRESSION: 1. No evidence of acute intracranial abnormality. 2. In comparison to remote prior from 2011, suspected minimal increase in size of an extra-axial 7 mm calcified lesion along the left aspect of the quadrigeminal cistern, most likely a small meningioma. No mass effect or edema. 3. Left ostiomeatal unit pattern sinus mucosal thickening. Correlate with signs/symptoms of sinusitis. 4. Small bilateral mastoid effusions.  She has subsequently seen neurosurgery, Dr. Marcello Moores who advised her that her meningioma is not amenable to surgery at this time.  He recommended evaluation with a neurologist and was supposed to make a referral, she is not sure as to the status of her referral and he also wanted her to see a neuro ophthalmologist.  She has not seen an ophthalmologist or neuro-ophthalmologist yet.  She does follow with an  optometrist and at one point she was suspected to have pseudotumor cerebri. She has corrective eyeglasses.  She reports chronic difficulty with her sleep for years, worse over the past 6 months.  She has difficulty maintaining sleep.  She has been taking melatonin, 20 mg at bedtime.  She takes Cymbalta 60 mg at night and has been on it for years for chronic pain.  She is supposed to see a rheumatologist as well.  She has joint pain affecting her hands and both knees and left hip.  She tries to be in bed around 9 PM but is not asleep until 2 or 3 AM some nights.  She has nocturia about once per average night and occasional morning headaches.  In late 2019 she had a respiratory illness, she had come back from a cruise and became acutely sick and was in the hospital on a BiPAP.  She was discharged with oxygen.  She is wondering if she had COVID at the time. She smokes about a pack per day.  She had seen pulmonology in the past.  She drinks quite a bit of caffeine in the form of coffee, 5 or 6 cups/day.  She drinks alcohol rarely.  She lives with her husband and works in his [employer] firm.  They have 7 children including a 107-year-old girl who lives with them and they are in the process of adopting her.  Her Past Medical History Is Significant For: Past Medical History:  Diagnosis Date  . Anemia   . Anginal pain ([place])    due to asthma meds  . Anxiety state, unspecified 10/12/2007  . Arthritis   . Asthma   .  CHEST PAIN, ATYPICAL 02/18/2010   after using albuterol  . CHEST PAIN-PRECORDIAL 02/19/2010   after albuterol  . CHF (congestive heart failure) ([place]) 1999   During Pregnancy, After C section  . COPD (chronic obstructive pulmonary disease) ([place])   . Diverticulosis   . FATIGUE 09/28/2007  . Fatty liver   . GASTROENTERITIS 10/03/2008  . GERD (gastroesophageal reflux disease)    occ takes OTC ttums  . History of vitamin D deficiency   . Hypothyroidism   . MIGRAINE HEADACHE 03/04/2010  . Obesity   .  PEDAL EDEMA 02/18/2010   comes and goes   . PEPTIC ULCER DISEASE 09/28/2007  . 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 PONV (postoperative nausea and vomiting)   . SEIZURE DISORDER 09/28/2007  . SUI (stress urinary incontinence, female)   . Thyroid nodule   . Tobacco abuse   . WEIGHT GAIN 09/28/2007    Her Past Surgical History Is Significant For: Past Surgical History:  Procedure Laterality Date  . ANKLE SURGERY     left  . APPENDECTOMY    . CARDIAC CATHETERIZATION  2015  . CESAREAN SECTION     x3  . CHOLECYSTECTOMY    . DILITATION & CURRETTAGE/HYSTROSCOPY WITH NOVASURE ABLATION N/A 12/26/2015   Procedure: DILATATION & CURETTAGE/HYSTEROSCOPY WITH ENDOMETRIAL ABLATION;  Surgeon: Jonnie Kind, MD;  Location: [facility];  Service: Gynecology;  Laterality: N/A;  . ESOPHAGOGASTRODUODENOSCOPY  2012  . KNEE ARTHROSCOPY Left    x2  . MOUTH LESION EXCISIONAL BIOPSY  04/2011   pre-cancerous, pallet of mouth  . SHOULDER ARTHROSCOPY WITH ROTATOR CUFF REPAIR Right 08/16/2019   Procedure: Right shoulder arthroscopy, subacromial decompression, distal clavicle resection, rotator cuff repair;  Surgeon: Justice Britain, MD;  Location: [facility];  Service: Orthopedics;  Laterality: Right;  115min  . SHOULDER ARTHROSCOPY WITH ROTATOR CUFF REPAIR Right 10/02/2019   Procedure: SHOULDER ARTHROSCOPY WITH ROTATOR CUFF REPAIR;  Surgeon: Justice Britain, MD;  Location: [facility];  Service: Orthopedics;  Laterality: Right;  12min    Her Family History Is Significant For: Family History  Problem Relation Age of Onset  . Cerebral aneurysm Mother   . Colon cancer Mother   . Heart disease Father   . Liver disease Father   . Diabetes Brother 1       died age 66  . Kidney disease Maternal Grandmother   . Colon cancer Maternal Grandmother   . Colon cancer Maternal Aunt   . Diabetes Sister     Her Social History Is Significant For: Social History   Socioeconomic History  . Marital status:  Married    Spouse name: Not on file  . Number of children: 3  . Years of education: Not on file  . Highest education level: Not on file  Occupational History    Employer: [employer]  Tobacco Use  . Smoking status: Current Every Day Smoker    Packs/day: 1.00    Years: 15.00    Pack years: 15.00    Types: Cigarettes  . Smokeless tobacco: Never Used  Vaping Use  . Vaping Use: Never used  Substance and Sexual Activity  . Alcohol use: Not Currently    Comment: occ  . Drug use: No  . Sexual activity: Yes    Birth control/protection: Surgical  Other Topics Concern  . Not on file  Social History Narrative  . Not on file   Social Determinants of Health   Financial Resource Strain: Not on file  Food Insecurity:  Not on file  Transportation Needs: Not on file  Physical Activity: Not on file  Stress: Not on file  Social Connections: Not on file    Her Allergies Are:  Allergies  Allergen Reactions  . Albuterol Anxiety and Other (See Comments)    Increased HR 130s (pt tolerates duoneb)  . Hydrocodone-Acetaminophen Nausea And Vomiting  . Morphine Other (See Comments)    hallucinations  . Adhesive [Tape] Rash    Paper tape is ok  :   Her Current Medications Are:  Outpatient Encounter Medications as of 09/23/2020  Medication Sig  . Cholecalciferol (VITAMIN D3) 125 MCG (5000 UT) TABS Take 2 tablets by mouth daily.  Marland Kitchen ipratropium-albuterol (DUONEB) 0.5-2.5 (3) MG/3ML SOLN Take 3 mLs by nebulization every 6 (six) hours as needed.  . levalbuterol (XOPENEX HFA) 45 MCG/ACT inhaler Inhale 1-2 puffs into the lungs daily as needed for wheezing or shortness of breath.  . Melatonin 10 MG TABS Take 20 mg by mouth at bedtime.  . naproxen (NAPROSYN) 500 MG tablet Take 1 tablet (500 mg total) by mouth 2 (two) times daily with a meal.  . predniSONE (DELTASONE) 20 MG tablet Take 2 tablets (40 mg total) by mouth daily with breakfast.  . progesterone (PROMETRIUM) 200 MG capsule Take 3  capsules (600 mg total) by mouth at bedtime.  . DULoxetine (CYMBALTA) 30 MG capsule TAKE 3 CAPSULES BY MOUTH EVERY DAY AT BEDTIME (Patient taking differently: Take 30 mg by mouth daily. TAKE 3 CAPSULES BY MOUTH EVERY DAY AT BEDTIME)  . [DISCONTINUED] azithromycin (ZITHROMAX) 250 MG tablet Take 2 tablets the first day followed by 1 tablet every day until finished.  . [DISCONTINUED] DHEA 50 MG CAPS Take 50 mg by mouth daily at 12 noon.  . [DISCONTINUED] Nirmatrelvir & Ritonavir 20 x 150 MG & 10 x 100MG  TBPK TAKE 3 TABLETS BY MOUTH 2 TIMES DAILY FOR 5 DAYS.  . [DISCONTINUED] NONFORMULARY OR COMPOUNDED ITEM Inject 10 mg into the muscle 2 (two) times a week. Testosterone cypionate in olive  oil (25 mg/mL).  Dispense 5 mL vial.  . [DISCONTINUED] NP THYROID 90 MG tablet Take 1 tablet (90 mg total) by mouth daily. (Patient taking differently: Take 90 mg by mouth 2 (two) times daily.)  . [DISCONTINUED] vitamin B-12 (CYANOCOBALAMIN) 1000 MCG tablet Take 1,000 mcg by mouth daily. (Patient not taking: Reported on 09/23/2020)   No facility-administered encounter medications on file as of 09/23/2020.  :   Review of Systems:  Out of a complete 14 point review of systems, all are reviewed and negative with the exception of these symptoms as listed below: Review of Systems  Neurological:       Here for sleep consult. No prior sleep study, reports she does snore at night.  Epworth Sleepiness Scale 0= would never doze 1= slight chance of dozing 2= moderate chance of dozing 3= high chance of dozing  Sitting and reading:2 Watching TV:2 Sitting inactive in a public place (ex. Theater or meeting):0 As a passenger in a car for an hour without a break:1 Lying down to rest in the afternoon:2 Sitting and talking to someone:0 Sitting quietly after lunch (no alcohol):0 In a car, while stopped in traffic:0 Total:7     Objective:  Neurological Exam  Physical Exam Physical Examination:   Vitals:   09/23/20  1228  BP: 133/88  Pulse: 74    General Examination: The patient is a very pleasant 45 y.o. female in no acute distress. She appears well-developed  and well-nourished and well groomed.   HEENT: Normocephalic, atraumatic, pupils are equal, round and reactive to light, extraocular tracking is good without limitation to gaze excursion or nystagmus noted. Hearing is grossly intact. Face is symmetric with normal facial animation. Speech is clear with no dysarthria noted. There is no hypophonia. There is no lip, neck/head, jaw or voice tremor. Neck is supple with full range of passive and active motion. There are no carotid bruits on auscultation. Oropharynx exam reveals: moderate mouth dryness, adequate dental hygiene, mild airway crowding secondary to small airway, tonsils on the smaller side, uvula normal, Mallampati class I.  Tongue protrudes centrally and palate elevates symmetrically, neck circumference 15 inches, minimal overbite.   Chest: Clear to auscultation without wheezing, rhonchi or crackles noted.  Heart: S1+S2+0, regular and normal without murmurs, rubs or gallops noted.   Abdomen: Soft, non-tender and non-distended with normal bowel sounds appreciated on auscultation.  Extremities: There is no pitting edema in the distal lower extremities bilaterally.   Skin: Warm and dry without trophic changes noted.   Musculoskeletal: exam reveals no obvious joint deformities, tenderness or joint swelling or erythema.   Neurologically:  Mental status: The patient is awake, alert and oriented in all 4 spheres. Her immediate and remote memory, attention, language skills and fund of knowledge are appropriate. There is no evidence of aphasia, agnosia, apraxia or anomia. Speech is clear with normal prosody and enunciation. Thought process is linear. Mood is normal and affect is normal.  Cranial nerves II - XII are as described above under HEENT exam.  Motor exam: Normal bulk, strength and tone is  noted. There is no tremor, Romberg is negative. Fine motor skills and coordination: grossly intact.  Cerebellar testing: No dysmetria or intention tremor. There is no truncal or gait ataxia.  Sensory exam: intact to light touch in the upper and lower extremities.  Gait, station and balance: She stands easily. No veering to one side is noted. No leaning to one side is noted. Posture is age-appropriate and stance is narrow based. Gait shows normal stride length and normal pace. No problems turning are noted. Tandem walk is unremarkable.                Assessment and Plan:   In summary, Lakara Ebbs is a very pleasant 45 y.o.-year old female  with an underlying medical history of reflux disease, migraine headaches, history of seizure, CHF, COPD, diverticulosis, anxiety, asthma, arthritis, anemia, stress incontinence, hypothyroidism, vitamin D deficiency, and overweight state, who presents for evaluation of her chronic sleep disturbance.  Her history and examination do raise the concern for underlying obstructive sleep apnea.  She had a recent brain MRI and she has recently seen a neurosurgeon, his records were not available for my review today but she has been diagnosed with a meningioma.  He also wanted her to see a neuro-ophthalmologist as I understand and she is awaiting a referral and she also reports that he wanted her to see a neurologist.  She is advised to follow through with the referrals and call his office for an update and if she is referred to our practice, I would be happy to see her.  She has seen an optometrist but was advised to see an ophthalmologist or neuro-ophthalmologist as I understand.  She is advised to continue to work on smoking cessation.  She is advised to reduce her caffeine intake especially if there was at some point concern for pseudotumor cerebri or increased intracranial fluid pressure.  Her MRI did not show any structural changes other than a meningioma.  This is not  surgically a need for intervention per her neurosurgeon.  I had a long chat with the patient about my findings and the diagnosis of OSA, its prognosis and treatment options. We talked about medical treatments, surgical interventions and non-pharmacological approaches. I explained in particular the risks and ramifications of untreated moderate to severe OSA, especially with respect to developing cardiovascular disease down the Road, including congestive heart failure, difficult to treat hypertension, cardiac arrhythmias, or stroke. Even type 2 diabetes has, in part, been linked to untreated OSA. Symptoms of untreated OSA include daytime sleepiness, memory problems, mood irritability and mood disorder such as depression and anxiety, lack of energy, as well as recurrent headaches, especially morning headaches. We talked about smoking cessation and trying to maintain a healthy lifestyle in general, as well as the importance of weight control. We also talked about the importance of good sleep hygiene. I recommended the following at this time: sleep study.  I explained the sleep test procedure to the patient and also outlined possible surgical and non-surgical treatment options of OSA, including the use of a custom-made dental device (which would require a referral to a specialist dentist or oral surgeon), upper airway surgical options, such as traditional UPPP or a novel less invasive surgical option in the form of Inspire hypoglossal nerve stimulation (which would involve a referral to an ENT surgeon). I also explained the CPAP treatment option to the patient, who indicated that she would be willing to try CPAP if the need arises. I explained the importance of being compliant with PAP treatment, not only for insurance purposes but primarily to improve Her symptoms, and for the patient's long term health benefit, including to reduce Her cardiovascular risks. I answered all her questions today and the patient was in  agreement. I plan to see her back after the sleep study is completed and encouraged her to call with any interim questions, concerns, problems or updates.   Thank you very much for allowing me to participate in the care of this nice patient. If I can be of any further assistance to you please do not hesitate to call me at [phone].  Sincerely,   Star Age, MD, PhD

## 2020-09-23 NOTE — Patient Instructions (Addendum)
Thank you for choosing [facility] for your sleep related care! It was nice to meet you today! I appreciate that you entrust me with your sleep related healthcare concerns. I hope, I was able to address at least some of your concerns today, and that I can help you feel reassured and also get better.    Here is what we discussed today and what we came up with as our plan for you:    Based on your symptoms and your exam I believe you are at risk for obstructive sleep apnea (aka OSA), and I think we should proceed with a sleep study to determine whether you do or do not have OSA and how severe it is. Even, if you have mild OSA, I may want you to consider treatment with CPAP, as treatment of even borderline or mild sleep apnea can result and improvement of symptoms such as sleep disruption, daytime sleepiness, nighttime bathroom breaks, restless leg symptoms, improvement of headache syndromes, even improved mood disorder.   As explained, an attended sleep study meaning you get to stay overnight in the sleep lab, lets [place] monitor sleep-related behaviors such as sleep talking and leg movements in sleep, in addition to monitoring for sleep apnea.  A home sleep test is a screening tool for sleep apnea only, and unfortunately does not help with any other sleep-related diagnoses.  Please remember, the long-term risks and ramifications of untreated moderate to severe obstructive sleep apnea are: increased Cardiovascular disease, including congestive heart failure, stroke, difficult to control hypertension, treatment resistant obesity, arrhythmias, especially irregular heartbeat commonly known as A. Fib. (atrial fibrillation); even type 2 diabetes has been linked to untreated OSA.   Sleep apnea can cause disruption of sleep and sleep deprivation in most cases, which, in turn, can cause recurrent headaches, problems with memory, mood, concentration, focus, and vigilance. Most people with untreated  sleep apnea report excessive daytime sleepiness, which can affect their ability to drive. Please do not drive if you feel sleepy. Patients with sleep apnea can also develop difficulty initiating and maintaining sleep (aka insomnia).   Having sleep apnea may increase your risk for other sleep disorders, including involuntary behaviors sleep such as sleep terrors, sleep talking, sleepwalking.    Having sleep apnea can also increase your risk for restless leg syndrome and leg movements at night.   Please note that untreated obstructive sleep apnea may carry additional perioperative morbidity. Patients with significant obstructive sleep apnea (typically, in the moderate to severe degree) should receive, if possible, perioperative PAP (positive airway pressure) therapy and the surgeons and particularly the anesthesiologists should be informed of the diagnosis and the severity of the sleep disordered breathing.   I will likely see you back after your sleep study to go over the test results and where to go from there. We will call you after your sleep study to advise about the results (most likely, you will hear from [facility], my nurse) and to set up an appointment at the time, as necessary.    Please follow through with the referrals as recommended by your neurosurgeon.  If he refers you to this practice, I would be happy to see you for headaches.  I would recommend formal evaluation of your vision through an ophthalmologist or neuro-ophthalmologist as recommended by Dr. Marcello Moores, your neurosurgeon.  Please also talk to your primary care physician about referral as needed.  We will proceed with sleep testing and keep you posted.  Please try to continue to  work on smoking cessation and please work on caffeine reduction as caffeine can cause increased fluid pressure around the brain.   Our sleep lab administrative assistant will call you to schedule your sleep study and give you further instructions, regarding  the check in process for the sleep study, arrival time, what to bring, when you can expect to leave after the study, etc., and to answer any other logistical questions you may have. If you don't hear back from her by about 2 weeks from now, please feel free to call her direct line at [phone] or you can call our general clinic number, or email [place] through My Chart.

## 2020-09-30 ENCOUNTER — Encounter (INDEPENDENT_AMBULATORY_CARE_PROVIDER_SITE_OTHER): Payer: Self-pay | Admitting: Internal Medicine

## 2020-10-01 ENCOUNTER — Other Ambulatory Visit (INDEPENDENT_AMBULATORY_CARE_PROVIDER_SITE_OTHER): Payer: Self-pay | Admitting: Internal Medicine

## 2020-10-01 DIAGNOSIS — D329 Benign neoplasm of meninges, unspecified: Secondary | ICD-10-CM

## 2020-10-02 ENCOUNTER — Other Ambulatory Visit (INDEPENDENT_AMBULATORY_CARE_PROVIDER_SITE_OTHER): Payer: Self-pay | Admitting: Internal Medicine

## 2020-10-03 ENCOUNTER — Encounter (INDEPENDENT_AMBULATORY_CARE_PROVIDER_SITE_OTHER): Payer: Self-pay | Admitting: Internal Medicine

## 2020-10-06 ENCOUNTER — Other Ambulatory Visit (INDEPENDENT_AMBULATORY_CARE_PROVIDER_SITE_OTHER): Payer: Self-pay | Admitting: Internal Medicine

## 2020-10-06 DIAGNOSIS — L989 Disorder of the skin and subcutaneous tissue, unspecified: Secondary | ICD-10-CM

## 2020-10-12 ENCOUNTER — Encounter (INDEPENDENT_AMBULATORY_CARE_PROVIDER_SITE_OTHER): Payer: Self-pay | Admitting: Internal Medicine

## 2020-10-13 ENCOUNTER — Other Ambulatory Visit (INDEPENDENT_AMBULATORY_CARE_PROVIDER_SITE_OTHER): Payer: Self-pay | Admitting: Internal Medicine

## 2020-10-13 ENCOUNTER — Telehealth: Payer: Self-pay | Admitting: Dermatology

## 2020-10-13 MED ORDER — PROGESTERONE 200 MG PO CAPS: 600.0000 mg | ORAL_CAPSULE | Freq: Every evening | ORAL | 3 refills | Status: DC

## 2020-10-13 NOTE — Telephone Encounter (Signed)
Patient is calling for a referral appointment from Hurshel Party, MD.  Patient is scheduled for 04/21/2021 at 2:00 with Lavonna Monarch, MD.

## 2020-10-15 NOTE — Telephone Encounter (Signed)
Referral attached to appointment

## 2020-10-15 NOTE — Progress Notes (Signed)
Office Visit Note  Patient: Lisa Ryan             Date of Birth: [birth year]           MRN: [id number]             PCP: Doree Albee, MD Referring: Doree Albee, MD Visit Date: 10/16/2020 Occupation: @GUAROCC @  Subjective:  Multiple joints and muscles   History of Present Illness: Lisa Ryan is a 45 y.o. female seen in consultation per request of Dr. Anastasio Champion.  According to the patient her symptoms a started about a year ago which have been gradually getting worse.  She has had problems with her shoulders in the past.  She has had right shoulder joint rotator cuff tear repair x2 in the past.  For the last 6 months she has been having increased pain and discomfort in her bilateral hands which she describes in her DIP joints.  She states she has difficulty pushing the filter for her cigarettes.  She describes pain in her trapezius region, lower back, shoulders, wrist, hands, hips (trochanter area), knee joints, ankles and her feet.  She has noticed intermittent swelling in her hands, knees and her feet.  She states she is very stiff in the morning.  She was recently seen by an orthopedic surgeon at [facility] and had left knee joint cortisone injection which was helpful.  She has been taking Naprosyn 500 mg twice daily at least for 1 year.  She has been getting frequent prednisone taper by Dr. Anastasio Champion.  She states that she has had a prednisone taper in October, twice in December, once in April, twice in May and once in June.  She still has a prednisone taper pending which she did not pick up.  She states she is unable to work and function without prednisone.  There is no history of psoriasis.  She is gravida 3, para 3, miscarriages 0.  There is no history of DVT.  Her mother has rheumatoid arthritis.  Activities of Daily Living:  Patient reports morning stiffness for 1 hour.   Patient Reports nocturnal pain.  Difficulty dressing/grooming: Denies Difficulty climbing stairs:  Reports Difficulty getting out of chair: Reports Difficulty using hands for taps, buttons, cutlery, and/or writing: Reports  Review of Systems  Constitutional:  Positive for fatigue.  HENT:  Negative for mouth sores, mouth dryness and nose dryness.   Eyes:  Negative for pain, itching and dryness.  Respiratory:  Positive for shortness of breath and difficulty breathing.        Due to COPD   Cardiovascular:  Negative for chest pain and palpitations.  Gastrointestinal:  Negative for blood in stool, constipation and diarrhea.  Endocrine: Negative for increased urination.  Genitourinary:  Negative for difficulty urinating.  Musculoskeletal:  Positive for joint pain, joint pain, joint swelling, myalgias, muscle weakness, morning stiffness, muscle tenderness and myalgias.  Skin:  Positive for color change. Negative for rash and redness.  Allergic/Immunologic: Negative for susceptible to infections.  Neurological:  Positive for headaches and weakness. Negative for dizziness, numbness and memory loss.  Hematological:  Negative for bruising/bleeding tendency.  Psychiatric/Behavioral:  Negative for confusion.    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   Cough 10/08/2013   Bronchitis 10/05/2013   Reactive airway disease 10/05/2013   Former heavy tobacco smoker 10/05/2013   Unspecified disorder of thyroid 04/18/2012   Flushing 03/07/2012   Atypical chest pain 03/07/2012   Gastritis 07/06/2011   Family hx of colon cancer 07/06/2011   Migraine headache 03/04/2010   PEDAL EDEMA 02/18/2010   Anxiety 10/12/2007   PEPTIC ULCER DISEASE 09/28/2007   SEIZURE DISORDER  09/28/2007   WEIGHT GAIN 09/28/2007    Past Medical History:  Diagnosis Date   Anemia    Anginal pain ([place])    due to asthma meds   Anxiety state, unspecified 10/12/2007   Arthritis    Asthma    CHEST PAIN, ATYPICAL 02/18/2010   after using albuterol   CHEST PAIN-PRECORDIAL 02/19/2010   after albuterol   CHF (congestive heart failure) ([place]) 1999   During Pregnancy, After C section   COPD (chronic obstructive pulmonary disease) ([place])    Diverticulosis    FATIGUE 09/28/2007   Fatty liver    GASTROENTERITIS 10/03/2008   GERD (gastroesophageal reflux disease)    occ takes OTC ttums   History of vitamin D deficiency    Hypothyroidism    MIGRAINE HEADACHE 03/04/2010   Obesity    PEDAL EDEMA 02/18/2010   comes and goes    PEPTIC ULCER DISEASE 09/28/2007   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   PONV (postoperative nausea and vomiting)    SEIZURE DISORDER 09/28/2007   SUI (stress urinary incontinence, female)    Thyroid nodule    Tobacco abuse    WEIGHT GAIN 09/28/2007    Family History  Problem Relation Age of Onset   Cerebral aneurysm Mother    Colon cancer Mother    Rheum arthritis Mother    Heart disease Father    Liver disease Father    Diabetes Sister    Atrial fibrillation Sister    Hypertension Sister    Healthy Sister    Muscular dystrophy Brother    Diabetes Brother    Colon cancer Maternal Aunt    Kidney disease Maternal Grandmother    Colon cancer Maternal Grandmother    Healthy Son    Past Surgical History:  Procedure Laterality Date   ANKLE SURGERY     left   APPENDECTOMY     CARDIAC CATHETERIZATION  2015   CESAREAN SECTION     x3   CHOLECYSTECTOMY     DILITATION & CURRETTAGE/HYSTROSCOPY WITH NOVASURE ABLATION N/A 12/26/2015   Procedure: DILATATION & CURETTAGE/HYSTEROSCOPY WITH ENDOMETRIAL ABLATION;  Surgeon: Jonnie Kind, MD;  Location: [facility];  Service: Gynecology;  Laterality: N/A;   ESOPHAGOGASTRODUODENOSCOPY  2012   KNEE  ARTHROSCOPY Left    x2   MOUTH LESION EXCISIONAL BIOPSY  04/2011   pre-cancerous, pallet of mouth   SHOULDER ARTHROSCOPY WITH ROTATOR CUFF REPAIR Right 08/16/2019   Procedure: Right shoulder arthroscopy, subacromial decompression, distal clavicle resection, rotator cuff repair;  Surgeon: Justice Britain, MD;  Location: [facility];  Service: Orthopedics;  Laterality: Right;  12min   SHOULDER ARTHROSCOPY WITH ROTATOR CUFF REPAIR Right 10/02/2019   Procedure: SHOULDER ARTHROSCOPY WITH ROTATOR CUFF REPAIR;  Surgeon: Justice Britain, MD;  Location: [facility];  Service: Orthopedics;  Laterality: Right;  52min   Social History   Social History Narrative   Not on file   Immunization History  Administered Date(s) Administered   [employer]  Sars-Covid-2 Vaccination 07/04/2019, 07/18/2019, 11/09/2019   Pneumococcal Conjugate-13 10/30/2013     Objective: Vital Signs: BP 120/82 (BP Location: Right Arm, Patient Position: Sitting, Cuff Size: Normal)   Pulse 91   Resp 17   Ht 5\' 7"  (1.702 m)   Wt 188 lb (85.3 kg)   BMI 29.44 kg/m    Physical Exam Vitals and nursing note reviewed.  Constitutional:      Appearance: She is well-developed.  HENT:     Head: Normocephalic and atraumatic.  Eyes:     Conjunctiva/sclera: Conjunctivae normal.  Cardiovascular:     Rate and Rhythm: Normal rate and regular rhythm.     Heart sounds: Normal heart sounds.  Pulmonary:     Effort: Pulmonary effort is normal.     Breath sounds: Normal breath sounds.  Abdominal:     General: Bowel sounds are normal.     Palpations: Abdomen is soft.  Musculoskeletal:     Cervical back: Normal range of motion.  Lymphadenopathy:     Cervical: No cervical adenopathy.  Skin:    General: Skin is warm and dry.     Capillary Refill: Capillary refill takes less than 2 seconds.  Neurological:     Mental Status: She is alert and oriented to person, place, and time.  Psychiatric:        Behavior: Behavior normal.     Musculoskeletal Exam:  C-spine was in good range of motion.  She had tenderness over trapezius region and also over the rib cage.  Shoulder joints with good range of motion without discomfort.  She had tenderness over bilateral lateral epicondyle region.  Elbow joints and wrist joints with good range of motion.  She has no tenderness or synovitis of her wrist joints or MCP joints.  PIP and DIP thickening was noted.  No synovitis was noted.  Hip joints with good range of motion.  She had bilateral trochanteric tenderness, gluteal tenderness and tenderness over the medial aspect of her knee joints.  Knee joints with good range of motion without any warmth swelling or effusion.  There was no tenderness over ankles or MTPs.  PIP and DIP thickening of bilateral feet was noted.   CDAI Score: -- Patient Global: --; Provider Global: -- Swollen: --; Tender: -- Joint Exam 10/16/2020   No joint exam has been documented for this visit   There is currently no information documented on the homunculus. Go to the Rheumatology activity and complete the homunculus joint exam.  Investigation: No additional findings.  Imaging: No results found.  Recent Labs: Lab Results  Component Value Date   WBC 6.8 04/23/2020   HGB 14.2 04/23/2020   PLT 325 04/23/2020   NA 138 04/23/2020   K 4.7 04/23/2020   CL 105 04/23/2020   CO2 24 04/23/2020   GLUCOSE 75 04/23/2020   BUN 15 04/23/2020   CREATININE 0.64 04/23/2020   BILITOT 0.3 04/23/2020   ALKPHOS 71 07/03/2018   AST 13 04/23/2020   ALT 21 04/23/2020   PROT 6.2 04/23/2020   ALBUMIN 3.8 07/03/2018   CALCIUM 9.2 04/23/2020   GFRAA 124 04/23/2020    Speciality Comments: No specialty comments available.  Procedures:  No procedures performed Allergies: Albuterol, Hydrocodone-acetaminophen, Morphine, and Adhesive [tape]   Assessment / Plan:     Visit Diagnoses: Polyarthralgia - patient gives history of pain in multiple joints and muscles over the last 1 year.  She states she  has been having frequent episodes of joint swelling which  she describes mostly in her DIP joints.  She is also noticed increased swelling in her knees and her feet.  She has had several courses of prednisone in the last 6 months.  She states she is unable to function without prednisone.  Last prednisone dose was about 2 weeks ago.  I have discouraged the use of prednisone.  Pain in both hands -she complains of pain and discomfort in her bilateral hands mostly over her DIP joints.  No synovitis was noted.  DIP and PIP thickening was noted which is consistent with osteoarthritis.  Plan: XR Hand 2 View Right, XR Hand 2 View Left, x-rays were consistent with osteoarthritis.  X-ray findings were discussed with the patient.  I will obtain labs to look for any underlying autoimmune process.  Sedimentation rate, Rheumatoid factor, Cyclic citrul peptide antibody, IgG, Uric acid, ANA  S/P right rotator cuff repair - x2. Doing well now.  Chronic pain of both knees -she complains of pain and discomfort in her bilateral knee joints.  She had tenderness over the medial aspect of the knee.  No synovitis was noted.  Right knee joint x-ray showed mild osteoarthritic changes and chondromalacia patella.  Plan: XR KNEE 3 VIEW RIGHT.  Moderate osteoarthritis and moderate chondromalacia patella was noted.  X-ray findings were discussed with the patient.  Chronic pain of both feet -she complains of pain and discomfort in her bilateral feet.  No synovitis was noted.  Osteoarthritic changes were noted on the clinical examination and on the radiographs.  Plan: XR Foot 2 Views Right, XR Foot 2 Views Left.  X-rays were consistent with osteoarthritis.  X-ray findings were discussed with the patient.  Fibromyalgia -she has generalized hyperalgesia and multiple tender points.  She has generalized muscle pain.  Detailed counseling fibromyalgia syndrome was provided.  She is on Cymbalta.  She would benefit from physical therapy, water  aerobics.  Plan: CK  Other fatigue - Plan: CBC with Differential/Platelet, COMPLETE METABOLIC PANEL WITH GFR  History of COPD-she has history of COPD.  Smoker - 11/2 PPD x 25 years  Acute respiratory failure with hypoxemia ([place]) - 2019 on bipap. Possible COVID -19 infection.  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  PUD (peptic ulcer disease) - 48 years ago  Family hx of colon cancer  Family history of rheumatoid arthritis - Mother  Orders: Orders Placed This Encounter  Procedures   XR Hand 2 View Right   XR Hand 2 View Left   XR KNEE 3 VIEW RIGHT   XR Foot 2 Views Right   XR Foot 2 Views Left   CBC with Differential/Platelet   COMPLETE METABOLIC PANEL WITH GFR   Sedimentation rate   CK   Rheumatoid factor   Cyclic citrul peptide antibody, IgG   Uric acid   ANA    No orders of the defined types were placed in this encounter.    Follow-Up Instructions: Return for Pain in multiple joints.   Bo Merino, MD  Note - This record has been created using Editor, commissioning.  Chart creation errors have been sought, but may not always  have been located. Such creation errors do not reflect on  the standard of medical care.

## 2020-10-16 ENCOUNTER — Ambulatory Visit: Payer: Self-pay

## 2020-10-16 ENCOUNTER — Ambulatory Visit (INDEPENDENT_AMBULATORY_CARE_PROVIDER_SITE_OTHER): Payer: 59 | Admitting: Rheumatology

## 2020-10-16 ENCOUNTER — Encounter: Payer: Self-pay | Admitting: Rheumatology

## 2020-10-16 ENCOUNTER — Other Ambulatory Visit: Payer: Self-pay

## 2020-10-16 VITALS — BP 120/82 | HR 91 | Resp 17 | Ht 67.0 in | Wt 188.0 lb

## 2020-10-16 DIAGNOSIS — Z800 Family history of malignant neoplasm of digestive organs: Secondary | ICD-10-CM

## 2020-10-16 DIAGNOSIS — M79642 Pain in left hand: Secondary | ICD-10-CM

## 2020-10-16 DIAGNOSIS — M2550 Pain in unspecified joint: Secondary | ICD-10-CM

## 2020-10-16 DIAGNOSIS — M25561 Pain in right knee: Secondary | ICD-10-CM

## 2020-10-16 DIAGNOSIS — M25562 Pain in left knee: Secondary | ICD-10-CM

## 2020-10-16 DIAGNOSIS — J9601 Acute respiratory failure with hypoxia: Secondary | ICD-10-CM

## 2020-10-16 DIAGNOSIS — Z8709 Personal history of other diseases of the respiratory system: Secondary | ICD-10-CM

## 2020-10-16 DIAGNOSIS — Z9889 Other specified postprocedural states: Secondary | ICD-10-CM | POA: Diagnosis not present

## 2020-10-16 DIAGNOSIS — M79641 Pain in right hand: Secondary | ICD-10-CM | POA: Diagnosis not present

## 2020-10-16 DIAGNOSIS — Z8261 Family history of arthritis: Secondary | ICD-10-CM

## 2020-10-16 DIAGNOSIS — G8929 Other chronic pain: Secondary | ICD-10-CM | POA: Diagnosis not present

## 2020-10-16 DIAGNOSIS — M79671 Pain in right foot: Secondary | ICD-10-CM | POA: Diagnosis not present

## 2020-10-16 DIAGNOSIS — F17200 Nicotine dependence, unspecified, uncomplicated: Secondary | ICD-10-CM

## 2020-10-16 DIAGNOSIS — M797 Fibromyalgia: Secondary | ICD-10-CM

## 2020-10-16 DIAGNOSIS — M79672 Pain in left foot: Secondary | ICD-10-CM | POA: Diagnosis not present

## 2020-10-16 DIAGNOSIS — R7611 Nonspecific reaction to tuberculin skin test without active tuberculosis: Secondary | ICD-10-CM

## 2020-10-16 DIAGNOSIS — R5383 Other fatigue: Secondary | ICD-10-CM

## 2020-10-16 DIAGNOSIS — K279 Peptic ulcer, site unspecified, unspecified as acute or chronic, without hemorrhage or perforation: Secondary | ICD-10-CM

## 2020-10-18 LAB — CBC WITH DIFFERENTIAL/PLATELET
Absolute Monocytes: 710 cells/uL (ref 200–950)
Basophils Absolute: 82 cells/uL (ref 0–200)
Basophils Relative: 0.9 %
Eosinophils Absolute: 319 cells/uL (ref 15–500)
Eosinophils Relative: 3.5 %
HCT: 41.2 % (ref 35.0–45.0)
Hemoglobin: 14.1 g/dL (ref 11.7–15.5)
Lymphs Abs: 2266 cells/uL (ref 850–3900)
MCH: 33.8 pg — ABNORMAL HIGH (ref 27.0–33.0)
MCHC: 34.2 g/dL (ref 32.0–36.0)
MCV: 98.8 fL (ref 80.0–100.0)
MPV: 10.2 fL (ref 7.5–12.5)
Monocytes Relative: 7.8 %
Neutro Abs: 5724 cells/uL (ref 1500–7800)
Neutrophils Relative %: 62.9 %
Platelets: 366 10*3/uL (ref 140–400)
RBC: 4.17 10*6/uL (ref 3.80–5.10)
RDW: 12.2 % (ref 11.0–15.0)
Total Lymphocyte: 24.9 %
WBC: 9.1 10*3/uL (ref 3.8–10.8)

## 2020-10-18 LAB — ANTI-NUCLEAR AB-TITER (ANA TITER): ANA Titer 1: 1:160 {titer} — ABNORMAL HIGH

## 2020-10-18 LAB — COMPLETE METABOLIC PANEL WITH GFR
AG Ratio: 1.8 (calc) (ref 1.0–2.5)
ALT: 17 U/L (ref 6–29)
AST: 10 U/L (ref 10–35)
Albumin: 4 g/dL (ref 3.6–5.1)
Alkaline phosphatase (APISO): 76 U/L (ref 31–125)
BUN: 17 mg/dL (ref 7–25)
CO2: 24 mmol/L (ref 20–32)
Calcium: 9.3 mg/dL (ref 8.6–10.2)
Chloride: 106 mmol/L (ref 98–110)
Creat: 0.67 mg/dL (ref 0.50–1.10)
GFR, Est African American: 121 mL/min/{1.73_m2} (ref >=60)
GFR, Est Non African American: 105 mL/min/{1.73_m2} (ref >=60)
Globulin: 2.2 g/dL (calc) (ref 1.9–3.7)
Glucose, Bld: 87 mg/dL (ref 65–99)
Potassium: 4.6 mmol/L (ref 3.5–5.3)
Sodium: 139 mmol/L (ref 135–146)
Total Bilirubin: 0.3 mg/dL (ref 0.2–1.2)
Total Protein: 6.2 g/dL (ref 6.1–8.1)

## 2020-10-18 LAB — CYCLIC CITRUL PEPTIDE ANTIBODY, IGG: Cyclic Citrullin Peptide Ab: <16 UNITS

## 2020-10-18 LAB — URIC ACID: Uric Acid, Serum: 5.1 mg/dL (ref 2.5–7.0)

## 2020-10-18 LAB — RHEUMATOID FACTOR: Rheumatoid fact SerPl-aCnc: <14 IU/mL (ref <14)

## 2020-10-18 LAB — CK: Total CK: 23 U/L — ABNORMAL LOW (ref 29–143)

## 2020-10-18 LAB — SEDIMENTATION RATE: Sed Rate: 17 mm/h (ref 0–20)

## 2020-10-18 LAB — ANA: Anti Nuclear Antibody (ANA): POSITIVE — AB

## 2020-10-20 ENCOUNTER — Telehealth: Payer: Self-pay | Admitting: Neurology

## 2020-10-20 NOTE — Progress Notes (Signed)
CBC is normal, CMP is normal, sed rate is normal, CK, rheumatoid factor, anti-CCP, uric acid are normal, ANA is low titer positive.  Please add ENA.

## 2020-10-20 NOTE — Progress Notes (Signed)
Please have patient come in for ANA, C3 and C4

## 2020-10-20 NOTE — Telephone Encounter (Signed)
LVM for patient about insurance auth for sleep study. I have had to re-submit again due to denial of first request. Told patient on vm I will call her back once I receive notification from insurance and will schedule asap.

## 2020-10-20 NOTE — Telephone Encounter (Signed)
Spoke with patient today and she was wanting an update on scheduling her sleep study. She can be reached at [phone].

## 2020-10-21 ENCOUNTER — Telehealth: Payer: Self-pay | Admitting: *Deleted

## 2020-10-21 DIAGNOSIS — M79641 Pain in right hand: Secondary | ICD-10-CM

## 2020-10-21 DIAGNOSIS — M2550 Pain in unspecified joint: Secondary | ICD-10-CM

## 2020-10-21 DIAGNOSIS — M25562 Pain in left knee: Secondary | ICD-10-CM

## 2020-10-21 DIAGNOSIS — G8929 Other chronic pain: Secondary | ICD-10-CM

## 2020-10-21 DIAGNOSIS — M79642 Pain in left hand: Secondary | ICD-10-CM

## 2020-10-21 NOTE — Telephone Encounter (Signed)
-----   Message from Bo Merino, MD sent at 10/20/2020 12:57 PM EDT ----- Please have patient come in for ANA, C3 and C4

## 2020-10-27 ENCOUNTER — Other Ambulatory Visit (INDEPENDENT_AMBULATORY_CARE_PROVIDER_SITE_OTHER): Payer: Self-pay | Admitting: Internal Medicine

## 2020-10-29 ENCOUNTER — Encounter (INDEPENDENT_AMBULATORY_CARE_PROVIDER_SITE_OTHER): Payer: Self-pay

## 2020-11-07 DIAGNOSIS — G8929 Other chronic pain: Secondary | ICD-10-CM | POA: Insufficient documentation

## 2020-11-07 DIAGNOSIS — M1711 Unilateral primary osteoarthritis, right knee: Secondary | ICD-10-CM | POA: Insufficient documentation

## 2020-11-07 DIAGNOSIS — M19041 Primary osteoarthritis, right hand: Secondary | ICD-10-CM | POA: Insufficient documentation

## 2020-11-07 DIAGNOSIS — M19042 Primary osteoarthritis, left hand: Secondary | ICD-10-CM | POA: Insufficient documentation

## 2020-11-07 NOTE — Progress Notes (Signed)
Office Visit Note  Patient: Lisa Ryan             Date of Birth: 08/01/1975           MRN: [id number]             PCP: Doree Albee, MD Referring: Doree Albee, MD Visit Date: 11/18/2020 Occupation: _0 @  Subjective:  Pain in multiple joints.   History of Present Illness: Lisa Ryan is a 45 y.o. female with history of osteoarthritis and fibromyalgia.  She states she continues to have pain and stiffness in multiple joints.  She been taking naproxen due to discomfort in her joints.  She has not seen any recent joint swelling.  She continues to have generalized fatigue and muscle pain.  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  She has been taking care of her children as well.  She does not have much personal time.  Activities of Daily Living:  Patient reports morning stiffness for 20 minutes.   Patient Reports nocturnal pain.  Difficulty dressing/grooming: Denies Difficulty climbing stairs: Denies Difficulty getting out of chair: Denies Difficulty using hands for taps, buttons, cutlery, and/or writing: Reports  Review of Systems  Constitutional:  Positive for fatigue.  HENT:  Positive for mouth dryness. Negative for mouth sores and nose dryness.   Eyes:  Negative for pain, itching and dryness.  Respiratory:  Positive for shortness of breath and difficulty breathing.   Cardiovascular:  Negative for chest pain and palpitations.  Gastrointestinal:  Negative for blood in stool, constipation and diarrhea.  Endocrine: Negative for increased urination.  Genitourinary:  Negative for difficulty urinating.  Musculoskeletal:  Positive for joint pain, joint pain, joint swelling, myalgias, morning stiffness, muscle tenderness and myalgias.  Skin:  Negative for color change, rash and redness.  Allergic/Immunologic: Positive for susceptible to infections.  Neurological:  Positive for dizziness and headaches. Negative for numbness,  memory loss and weakness.  Hematological:  Negative for bruising/bleeding tendency.  Psychiatric/Behavioral:  Negative for confusion.    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   Cough 10/08/2013   Bronchitis 10/05/2013   Reactive airway disease 10/05/2013   Former heavy tobacco smoker 10/05/2013   Unspecified disorder of thyroid 04/18/2012   Flushing 03/07/2012   Atypical chest pain 03/07/2012   Gastritis 07/06/2011   Family hx of colon cancer 07/06/2011   Migraine headache 03/04/2010   PEDAL EDEMA 02/18/2010   Anxiety 10/12/2007   PEPTIC ULCER DISEASE 09/28/2007   SEIZURE DISORDER 09/28/2007   WEIGHT GAIN 09/28/2007    Past Medical History:  Diagnosis Date   Anemia    Ang[place] pain ([place]    due to asthma meds   Anxiety state, unspecified 10/12/2007   Arthritis    Asthma    CHEST PAIN, ATYPICAL 02/18/2010   after using albuterol   CHEST PAIN-PRECORDIAL 02/19/2010   after albuterol   CHF (congestive heart failure) ([place]) 1999  During Pregnancy, After C section   COPD (chronic obstructive pulmonary disease) ([place])    Diverticulosis    FATIGUE 09/28/2007   Fatty liver    GASTROENTERITIS 10/03/2008   GERD (gastroesophageal reflux disease)    occ takes OTC ttums   History of vitamin D deficiency    Hypothyroidism    MIGRAINE HEADACHE 03/04/2010   Obesity    PEDAL EDEMA 02/18/2010   comes and goes    PEPTIC ULCER DISEASE 09/28/2007   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   PONV  (postoperative nausea and vomiting)    SEIZURE DISORDER 09/28/2007   SUI (stress urinary incontinence, female)    Thyroid nodule    Tobacco abuse    WEIGHT GAIN 09/28/2007    Family History  Problem Relation Age of Onset   Cerebral aneurysm Mother    Colon cancer Mother    Rheum arthritis Mother    Heart disease Father    Liver disease Father    Diabetes Sister    Atrial fibrillation Sister    Hypertension Sister    Healthy Sister    Muscular dystrophy Brother    Diabetes Brother    Colon cancer Maternal Aunt    Kidney disease Maternal Grandmother    Colon cancer Maternal Grandmother    Healthy Son    Past Surgical History:  Procedure Laterality Date   ANKLE SURGERY     left   APPENDECTOMY     CARDIAC CATHETERIZATION  2015   CESAREAN SECTION     x3   CHOLECYSTECTOMY     DILITATION & CURRETTAGE/HYSTROSCOPY WITH NOVASURE ABLATION N/A 12/26/2015   Procedure: DILATATION & CURETTAGE/HYSTEROSCOPY WITH ENDOMETRIAL ABLATION;  Surgeon: Jonnie Kind, MD;  Location: [facility];  Service: Gynecology;  Laterality: N/A;   ESOPHAGOGASTRODUODENOSCOPY  2012   KNEE ARTHROSCOPY Left    x2   MOUTH LESION EXCISIONAL BIOPSY  04/2011   pre-cancerous, pallet of mouth   SHOULDER ARTHROSCOPY WITH ROTATOR CUFF REPAIR Right 08/16/2019   Procedure: Right shoulder arthroscopy, subacromial decompression, distal clavicle resection, rotator cuff repair;  Surgeon: Justice Britain, MD;  Location: [facility];  Service: Orthopedics;  Laterality: Right;  175mn   SHOULDER ARTHROSCOPY WITH ROTATOR CUFF REPAIR Right 10/02/2019   Procedure: SHOULDER ARTHROSCOPY WITH ROTATOR CUFF REPAIR;  Surgeon: SJustice Britain MD;  Location: [facility];  Service: Orthopedics;  Laterality: Right;  980m   Social History   Social History Narrative   Not on file   Immunization History  Administered Date(s) Administered   Moderna Sars-Covid-2 Vaccination 07/04/2019, 07/18/2019, 11/09/2019   Pneumococcal Conjugate-13 10/30/2013      Objective: Vital Signs: BP 127/84 (BP Location: Left Arm, Patient Position: Sitting, Cuff Size: Normal)   Pulse 79   Ht _0  (1.702 m)   Wt 186 lb 6.4 oz (84.6 kg)   BMI 29.19 kg/m    Physical Exam Vitals and nursing note reviewed.  Constitutional:      Appearance: She is well-developed.  HENT:     Head: Normocephalic and atraumatic.  Eyes:     Conjunctiva/sclera: Conjunctivae normal.  Cardiovascular:     Rate and Rhythm: Normal rate and regular rhythm.     Heart sounds: Normal heart sounds.  Pulmonary:     Effort: Pulmonary effort is normal.     Breath sounds: Normal breath sounds.  Abdominal:     General: Bowel sounds are normal.     Palpations: Abdomen is soft.  Musculoskeletal:     Cervical back: Normal range  of motion.  Lymphadenopathy:     Cervical: No cervical adenopathy.  Skin:    General: Skin is warm and dry.     Capillary Refill: Capillary refill takes less than 2 seconds.  Neurological:     Mental Status: She is alert and oriented to person, place, and time.  Psychiatric:        Behavior: Behavior normal.     Musculoskeletal Exam: C-spine was in good range of motion.  Shoulder joints, elbow joints, wrist joints, MCPs PIPs and DIPs with good range of motion.  She had bilateral PIP and DIP thickening.  Hip joints and knee joints in good range of motion.  She had no tenderness over ankles or MTPs.  She has generalized hyperalgesia and positive tender points.  CDAI Exam: CDAI Score: -- Patient Global: --; Provider Global: -- Swollen: --; Tender: -- Joint Exam 11/18/2020   No joint exam has been documented for this visit   There is currently no information documented on the homunculus. Go to the Rheumatology activity and complete the homunculus joint exam.  Investigation: No additional findings.  Imaging: No results found.  Recent Labs: Lab Results  Component Value Date   WBC 9.1 10/16/2020   HGB 14.1 10/16/2020   PLT 366 10/16/2020   NA 139  10/16/2020   K 4.6 10/16/2020   CL 106 10/16/2020   CO2 24 10/16/2020   GLUCOSE 87 10/16/2020   BUN 17 10/16/2020   CREATININE 0.67 10/16/2020   BILITOT 0.3 10/16/2020   ALKPHOS 71 07/03/2018   AST 10 10/16/2020   ALT 17 10/16/2020   PROT 6.2 10/16/2020   ALBUMIN 3.8 07/03/2018   CALCIUM 9.3 10/16/2020   GFRAA 121 10/16/2020   October 16, 2020 ANA 1: 160NH, RF negative, anti-CCP negative, uric acid 5.1, ESR 17, CK23   Speciality Comments: No specialty comments available.  Procedures:  No procedures performed Allergies: Albuterol, Hydrocodone-acetaminophen, Morphine, and Adhesive [tape]   Assessment / Plan:     Visit Diagnoses: Primary osteoarthritis of both hands - History of recurrent swelling in bilateral hands.  She has had several courses of prednisone.  Clinical and radiographic findings were consistent with osteoarthritis.  X-ray findings were discussed with the patient.  I do not see any synovitis.  Note on hand exercises was given.  S/P right rotator cuff repair - x2 doing well.  Primary osteoarthritis of right knee - Moderate osteoarthritis and moderate chondromalacia patella.  No synovitis was noted.  A handout on knee exercises was given.  Chronic pain of both feet - Clinical and radiographic findings are consistent with osteoarthritis.  X-ray findings were discussed with the patient.  Positive ANA (antinuclear antibody) -she has positive ANA.  There is no history of oral ulcers, nasal ulcers, sicca symptoms, Raynaud's phenomenon, photosensitivity or lymphadenopathy.  I will obtain additional labs today.  Plan: Anti-scleroderma antibody, RNP Antibody, Anti-Smith antibody, Sjogrens syndrome-A extractable nuclear antibody, Sjogrens syndrome-B extractable nuclear antibody, Anti-DNA antibody, double-stranded, C3 and C4, Beta-2 glycoprotein antibodies, Cardiolipin antibodies, IgG, IgM, IgA, Lupus Anticoagulant Eval w/Reflex, Urinalysis, Routine w reflex microscopic.  We will  contact her once the lab results are available.  Fibromyalgia - She had generalized pain, positive tender points and hyperalgesia.  She is on Cymbalta.  I discussed the benefit from water therapy, swimming, stretching and physical therapy.  She does not have much personal time.  I will refer her to integrative therapies.- Plan: Ambulatory referral to Physical Therapy  Other fatigue -she continues to have  some fatigue.  Plan: Serum protein electrophoresis with reflex, Ambulatory referral to Physical Therapy  Family history of rheumatoid arthritis - Mother.  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  Family hx of colon cancer   Orders: Orders Placed This Encounter  Procedures   Anti-scleroderma antibody   RNP Antibody   Anti-Smith antibody   Sjogrens syndrome-A extractable nuclear antibody   Sjogrens syndrome-B extractable nuclear antibody   Anti-DNA antibody, double-stranded   C3 and C4   Beta-2 glycoprotein antibodies   Cardiolipin antibodies, IgG, IgM, IgA   Lupus Anticoagulant Eval w/Reflex   Serum protein electrophoresis with reflex   Urinalysis, Routine w reflex microscopic   Ambulatory referral to Physical Therapy    No orders of the defined types were placed in this encounter.    Follow-Up Instructions: Return in about 3 months (around 02/18/2021) for Positive ANA, osteoarthritis.   Bo Merino, MD  Note - This record has been created using Editor, commissioning.  Chart creation errors have been sought, but may not always  have been located. Such creation errors do not reflect on  the standard of medical care.

## 2020-11-10 ENCOUNTER — Ambulatory Visit (INDEPENDENT_AMBULATORY_CARE_PROVIDER_SITE_OTHER): Payer: 59 | Admitting: Internal Medicine

## 2020-11-11 ENCOUNTER — Encounter (INDEPENDENT_AMBULATORY_CARE_PROVIDER_SITE_OTHER): Payer: Self-pay

## 2020-11-12 ENCOUNTER — Other Ambulatory Visit: Payer: Self-pay

## 2020-11-12 DIAGNOSIS — M2550 Pain in unspecified joint: Secondary | ICD-10-CM

## 2020-11-12 DIAGNOSIS — M79642 Pain in left hand: Secondary | ICD-10-CM

## 2020-11-12 DIAGNOSIS — G8929 Other chronic pain: Secondary | ICD-10-CM

## 2020-11-12 DIAGNOSIS — M79641 Pain in right hand: Secondary | ICD-10-CM

## 2020-11-12 DIAGNOSIS — M79671 Pain in right foot: Secondary | ICD-10-CM

## 2020-11-13 ENCOUNTER — Telehealth: Payer: Self-pay | Admitting: Neurology

## 2020-11-13 ENCOUNTER — Ambulatory Visit: Payer: 59 | Admitting: Rheumatology

## 2020-11-13 LAB — SJOGRENS SYNDROME-A EXTRACTABLE NUCLEAR ANTIBODY: SSA (Ro) (ENA) Antibody, IgG: <1 AI

## 2020-11-13 LAB — ANTI-SCLERODERMA ANTIBODY: Scleroderma (Scl-70) (ENA) Antibody, IgG: <1 AI

## 2020-11-13 LAB — RNP ANTIBODY: Ribonucleic Protein(ENA) Antibody, IgG: <1 AI

## 2020-11-13 LAB — C3 AND C4
C3 Complement: 152 mg/dL (ref 83–193)
C4 Complement: 23 mg/dL (ref 15–57)

## 2020-11-13 LAB — ANTI-DNA ANTIBODY, DOUBLE-STRANDED: ds DNA Ab: 1 IU/mL

## 2020-11-13 LAB — ANTI-SMITH ANTIBODY: ENA SM Ab Ser-aCnc: <1 AI

## 2020-11-13 LAB — SJOGRENS SYNDROME-B EXTRACTABLE NUCLEAR ANTIBODY: SSB (La) (ENA) Antibody, IgG: <1 AI

## 2020-11-13 NOTE — Telephone Encounter (Signed)
[place] ([place]) called, last name do not match name on authorization. Need verification if patient has married or divorce reason for name change. Would like a call back.

## 2020-11-13 NOTE — Telephone Encounter (Signed)
I called the patient and left voicemail to clarify the information.  Looks like she may have gotten married after the insurance card was printed.  I had tried to call her earlier but she was not able to talk on the phone at that time and also had mentioned she could not make an appointment today.  I see our sleep team has been in contact with insurance regarding authorization so I will forward this message to sleep team.

## 2020-11-17 ENCOUNTER — Ambulatory Visit (INDEPENDENT_AMBULATORY_CARE_PROVIDER_SITE_OTHER): Payer: 59 | Admitting: Internal Medicine

## 2020-11-17 ENCOUNTER — Encounter (INDEPENDENT_AMBULATORY_CARE_PROVIDER_SITE_OTHER): Payer: Self-pay | Admitting: Internal Medicine

## 2020-11-17 ENCOUNTER — Other Ambulatory Visit: Payer: Self-pay

## 2020-11-17 VITALS — BP 118/74 | HR 82 | Temp 97.5°F | Ht 67.0 in | Wt 187.8 lb

## 2020-11-17 DIAGNOSIS — R5381 Other malaise: Secondary | ICD-10-CM

## 2020-11-17 DIAGNOSIS — F17210 Nicotine dependence, cigarettes, uncomplicated: Secondary | ICD-10-CM | POA: Diagnosis not present

## 2020-11-17 DIAGNOSIS — E663 Overweight: Secondary | ICD-10-CM

## 2020-11-17 DIAGNOSIS — R5383 Other fatigue: Secondary | ICD-10-CM

## 2020-11-17 DIAGNOSIS — N951 Menopausal and female climacteric states: Secondary | ICD-10-CM | POA: Diagnosis not present

## 2020-11-17 MED ORDER — VARENICLINE TARTRATE 0.5 MG PO TABS: 0.5000 mg | ORAL_TABLET | Freq: Two times a day (BID) | ORAL | 0 refills | Status: DC

## 2020-11-17 MED ORDER — VARENICLINE TARTRATE 1 MG PO TABS: 1.0000 mg | ORAL_TABLET | Freq: Two times a day (BID) | ORAL | 3 refills | Status: DC

## 2020-11-17 NOTE — Patient Instructions (Signed)
[facility] Dietary Recommendations for Weight Loss What to Avoid Avoid added sugars Often added sugar can be found in processed foods such as many condiments, dry cereals, cakes, cookies, chips, crisps, crackers, candies, sweetened drinks, etc.  Read labels and AVOID/DECREASE use of foods with the following in their ingredient list: Sugar, fructose, high fructose corn syrup, sucrose, glucose, maltose, dextrose, molasses, cane sugar, brown sugar, any type of syrup, agave nectar, etc.   Avoid snacking in between meals Avoid foods made with flour If you are going to eat food made with flour, choose those made with whole-grains; and, minimize your consumption as much as is tolerable Avoid processed foods These foods are generally stocked in the middle of the grocery store. Focus on shopping on the perimeter of the grocery.  Avoid Meat  We recommend following a plant-based diet at [facility]. Thus, we recommend avoiding meat as a general rule. Consider eating beans, legumes, eggs, and/or dairy products for regular protein sources If you plan on eating meat limit to 4 ounces of meat at a time and choose lean options such as Fish, chicken, [place]. Avoid red meat intake such as pork and/or steak What to Include Vegetables GREEN LEAFY VEGETABLES: Kale, spinach, mustard greens, collard greens, cabbage, broccoli, etc. OTHER: Asparagus, cauliflower, eggplant, carrots, peas, Brussel sprouts, tomatoes, bell peppers, zucchini, beets, cucumbers, etc. Grains, seeds, and legumes Beans: kidney beans, black eyed peas, garbanzo beans, black beans, pinto beans, etc. Whole, unrefined grains: brown rice, barley, bulgur, oatmeal, etc. Healthy fats  Avoid highly processed fats such as vegetable oil Examples of healthy fats: avocado, olives, virgin olive oil, dark chocolate (?72% Cocoa), nuts (peanuts, almonds, walnuts, cashews, pecans, etc.) None to Low Intake of Animal Sources of Protein Meat  sources: chicken, [place], salmon, tuna. Limit to 4 ounces of meat at one time. Consider limiting dairy sources, but when choosing dairy focus on: PLAIN [place] yogurt, cottage cheese, high-protein milk Fruit Choose berries  When to Eat Intermittent Fasting: Choosing not to eat for a specific time period, but DO FOCUS ON HYDRATION when fasting Multiple Techniques: Time Restricted Eating: eat 3 meals in a day, each meal lasting no more than 60 minutes, no snacks between meals 16-18 hour fast: fast for 16 to 18 hours up to 7 days a week. Often suggested to start with 2-3 nonconsecutive days per week.  Remember the time you sleep is counted as fasting.  Examples of eating schedule: Fast from 7:00pm-11:00am. Eat between 11:00am-7:00pm.  24-hour fast: fast for 24 hours up to every other day. Often suggested to start with 1 day per week Remember the time you sleep is counted as fasting Examples of eating schedule:  Eating day: eat 2-3 meals on your eating day. If doing 2 meals, each meal should last no more than 90 minutes. If doing 3 meals, each meal should last no more than 60 minutes. Finish last meal by 7:00pm. Fasting day: Fast until 7:00pm.  IF YOU FEEL UNWELL FOR ANY REASON/IN ANY WAY WHEN FASTING, STOP FASTING BY EATING A NUTRITIOUS SNACK OR LIGHT MEAL ALWAYS FOCUS ON HYDRATION DURING FASTS Acceptable Hydration sources: water, broths, tea/coffee (black tea/coffee is best but using a small amount of whole-fat dairy products in coffee/tea is acceptable).  Poor Hydration Sources: anything with sugar or artificial sweeteners added to it  These recommendations have been developed for patients that are actively receiving medical care from either Dr. Anastasio Champion or Jeralyn Ruths, DNP, NP-C at [facility]. These recommendations  are developed for patients with specific medical conditions and are not meant to be distributed or used by others that are not actively receiving care from either provider  listed above at [facility]. It is not appropriate to participate in the above eating plans without proper medical supervision.   Reference: Rexanne Mano. The obesity code. [place]; 2016.

## 2020-11-17 NOTE — Progress Notes (Signed)
Metrics: Intervention Frequency ACO  Documented Smoking Status Yearly  Screened one or more times in 24 months  Cessation Counseling or  Active cessation medication Past 24 months  Past 24 months   Guideline developer: UpToDate (See UpToDate for funding source) Date Released: 2014       Wellness Office Visit  Subjective:  Patient ID: Lisa Ryan, female    DOB: 1975/06/23  Age: 45 y.o. MRN: [id number]  CC: This lady comes in for follow-up of perimenopause, fatigue, nicotine addiction and her overweight state. HPI  She decided to stop desiccated thyroid, testosterone as she did not feel this was helping her.  She also stopped DHEA. She continues on progesterone which does seem to help her. She is frustrated that she cannot quit smoking and she is frustrated about her weight as she has not lost any weight. On closer questioning, she tells me that she drinks 6-7 sodas every single day. She continues to smoke cigarettes approximately a pack a day. Past Medical History:  Diagnosis Date   Anemia    Anginal pain ([place])    due to asthma meds   Anxiety state, unspecified 10/12/2007   Arthritis    Asthma    CHEST PAIN, ATYPICAL 02/18/2010   after using albuterol   CHEST PAIN-PRECORDIAL 02/19/2010   after albuterol   CHF (congestive heart failure) ([place]) 1999   During Pregnancy, After C section   COPD (chronic obstructive pulmonary disease) ([place])    Diverticulosis    FATIGUE 09/28/2007   Fatty liver    GASTROENTERITIS 10/03/2008   GERD (gastroesophageal reflux disease)    occ takes OTC ttums   History of vitamin D deficiency    Hypothyroidism    MIGRAINE HEADACHE 03/04/2010   Obesity    PEDAL EDEMA 02/18/2010   comes and goes    PEPTIC ULCER DISEASE 09/28/2007   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   PONV (postoperative nausea and vomiting)    SEIZURE DISORDER 09/28/2007   SUI (stress urinary incontinence, female)    Thyroid nodule    Tobacco abuse     WEIGHT GAIN 09/28/2007   Past Surgical History:  Procedure Laterality Date   ANKLE SURGERY     left   APPENDECTOMY     CARDIAC CATHETERIZATION  2015   CESAREAN SECTION     x3   CHOLECYSTECTOMY     DILITATION & CURRETTAGE/HYSTROSCOPY WITH NOVASURE ABLATION N/A 12/26/2015   Procedure: DILATATION & CURETTAGE/HYSTEROSCOPY WITH ENDOMETRIAL ABLATION;  Surgeon: Jonnie Kind, MD;  Location: [facility];  Service: Gynecology;  Laterality: N/A;   ESOPHAGOGASTRODUODENOSCOPY  2012   KNEE ARTHROSCOPY Left    x2   MOUTH LESION EXCISIONAL BIOPSY  04/2011   pre-cancerous, pallet of mouth   SHOULDER ARTHROSCOPY WITH ROTATOR CUFF REPAIR Right 08/16/2019   Procedure: Right shoulder arthroscopy, subacromial decompression, distal clavicle resection, rotator cuff repair;  Surgeon: Justice Britain, MD;  Location: [facility];  Service: Orthopedics;  Laterality: Right;  144min   SHOULDER ARTHROSCOPY WITH ROTATOR CUFF REPAIR Right 10/02/2019   Procedure: SHOULDER ARTHROSCOPY WITH ROTATOR CUFF REPAIR;  Surgeon: Justice Britain, MD;  Location: [facility];  Service: Orthopedics;  Laterality: Right;  30min     Family History  Problem Relation Age of Onset   Cerebral aneurysm Mother    Colon cancer Mother    Rheum arthritis Mother    Heart disease Father    Liver disease Father    Diabetes Sister    Atrial fibrillation Sister  Hypertension Sister    Healthy Sister    Muscular dystrophy Brother    Diabetes Brother    Colon cancer Maternal Aunt    Kidney disease Maternal Grandmother    Colon cancer Maternal Grandmother    Healthy Son     Social History   Social History Narrative   Not on file   Social History   Tobacco Use   Smoking status: Every Day    Packs/day: 1.00    Years: 15.00    Pack years: 15.00    Types: Cigarettes   Smokeless tobacco: Never  Substance Use Topics   Alcohol use: Not Currently    Comment: occ    Current Meds  Medication Sig   DULoxetine (CYMBALTA) 30 MG capsule TAKE 3  CAPSULES BY MOUTH EVERY DAY AT BEDTIME   ipratropium-albuterol (DUONEB) 0.5-2.5 (3) MG/3ML SOLN Take 3 mLs by nebulization every 6 (six) hours as needed.   levalbuterol (XOPENEX HFA) 45 MCG/ACT inhaler Inhale 1-2 puffs into the lungs daily as needed for wheezing or shortness of breath.   Melatonin 10 MG TABS Take 20 mg by mouth at bedtime.   naproxen (NAPROSYN) 500 MG tablet TAKE 1 TABLET(500 MG) BY MOUTH TWICE DAILY WITH A MEAL   progesterone (PROMETRIUM) 200 MG capsule Take 3 capsules (600 mg total) by mouth at bedtime.   varenicline (CHANTIX CONTINUING MONTH PAK) 1 MG tablet Take 1 tablet (1 mg total) by mouth 2 (two) times daily.   varenicline (CHANTIX) 0.5 MG tablet Take 1 tablet (0.5 mg total) by mouth 2 (two) times daily.       Objective:   Today's Vitals: BP 118/74   Pulse 82   Temp (!) 97.5 F (36.4 C) (Temporal)   Ht 5\' 7"  (1.702 m)   Wt 187 lb 12.8 oz (85.2 kg)   SpO2 96%   BMI 29.41 kg/m  Vitals with BMI 11/17/2020 10/16/2020 10/16/2020  Height 5\' 7"  - 5\' 7"   Weight 187 lbs 13 oz - 188 lbs  BMI 07.62 - 26.33  Systolic 354 562 563  Diastolic 74 82 86  Pulse 82 91 85     Physical Exam  She remains overweight.  She has not lost any weight.     Assessment   1. Perimenopause   2. Malaise and fatigue   3. Cigarette nicotine dependence without complication   4. Overweight (BMI 25.0-29.9)       Tests ordered No orders of the defined types were placed in this encounter.    Plan: 1.  We discussed nutrition again and the concept of intermittent fasting for prolonged periods of time but the most important thing that will help her is to quit also does not drink water and coffee that she likes to drink also.  If she does this, I believe she will make significant improvement in her weight and overall health. 2.  We discussed tobacco cessation and, after discussion, she is willing to try Chantix and I have sent her a prescription. 3.  Follow-up in about 6  months.    Meds ordered this encounter  Medications   varenicline (CHANTIX) 0.5 MG tablet    Sig: Take 1 tablet (0.5 mg total) by mouth 2 (two) times daily.    Dispense:  20 tablet    Refill:  0   varenicline (CHANTIX CONTINUING MONTH PAK) 1 MG tablet    Sig: Take 1 tablet (1 mg total) by mouth 2 (two) times daily.    Dispense:  60 tablet    Refill:  3     Zarayah Lanting Luther Parody, MD

## 2020-11-18 ENCOUNTER — Encounter: Payer: Self-pay | Admitting: Rheumatology

## 2020-11-18 ENCOUNTER — Ambulatory Visit (INDEPENDENT_AMBULATORY_CARE_PROVIDER_SITE_OTHER): Payer: 59 | Admitting: Rheumatology

## 2020-11-18 VITALS — BP 127/84 | HR 79 | Ht 67.0 in | Wt 186.4 lb

## 2020-11-18 DIAGNOSIS — M1711 Unilateral primary osteoarthritis, right knee: Secondary | ICD-10-CM | POA: Diagnosis not present

## 2020-11-18 DIAGNOSIS — M19041 Primary osteoarthritis, right hand: Secondary | ICD-10-CM | POA: Diagnosis not present

## 2020-11-18 DIAGNOSIS — Z9889 Other specified postprocedural states: Secondary | ICD-10-CM | POA: Diagnosis not present

## 2020-11-18 DIAGNOSIS — Z800 Family history of malignant neoplasm of digestive organs: Secondary | ICD-10-CM

## 2020-11-18 DIAGNOSIS — Z8709 Personal history of other diseases of the respiratory system: Secondary | ICD-10-CM

## 2020-11-18 DIAGNOSIS — K279 Peptic ulcer, site unspecified, unspecified as acute or chronic, without hemorrhage or perforation: Secondary | ICD-10-CM

## 2020-11-18 DIAGNOSIS — M79672 Pain in left foot: Secondary | ICD-10-CM

## 2020-11-18 DIAGNOSIS — R768 Other specified abnormal immunological findings in serum: Secondary | ICD-10-CM

## 2020-11-18 DIAGNOSIS — M79671 Pain in right foot: Secondary | ICD-10-CM | POA: Diagnosis not present

## 2020-11-18 DIAGNOSIS — R5383 Other fatigue: Secondary | ICD-10-CM

## 2020-11-18 DIAGNOSIS — M797 Fibromyalgia: Secondary | ICD-10-CM

## 2020-11-18 DIAGNOSIS — R7611 Nonspecific reaction to tuberculin skin test without active tuberculosis: Secondary | ICD-10-CM

## 2020-11-18 DIAGNOSIS — Z8261 Family history of arthritis: Secondary | ICD-10-CM

## 2020-11-18 DIAGNOSIS — F17200 Nicotine dependence, unspecified, uncomplicated: Secondary | ICD-10-CM

## 2020-11-18 DIAGNOSIS — M19042 Primary osteoarthritis, left hand: Secondary | ICD-10-CM

## 2020-11-18 DIAGNOSIS — G8929 Other chronic pain: Secondary | ICD-10-CM

## 2020-11-18 NOTE — Patient Instructions (Signed)
Journal for Nurse Practitioners, 15(4), 263-267. Retrieved February 06, 2018 from [email or web address]">  Knee Exercises Ask your health care provider which exercises are safe for you. Do exercises exactly as told by your health care provider and adjust them as directed. It is normal to feel mild stretching, pulling, tightness, or discomfort as you do these exercises. Stop right away if you feel sudden pain or your pain gets worse. Do not begin these exercises until told by your health care provider. Stretching and range-of-motion exercises These exercises warm up your muscles and joints and improve the movement and flexibility of your knee. These exercises also help to relieve pain andswelling. Knee extension, prone Lie on your abdomen (prone position) on a bed. Place your left / right knee just beyond the edge of the surface so your knee is not on the bed. You can put a towel under your left / right thigh just above your kneecap for comfort. Relax your leg muscles and allow gravity to straighten your knee (extension). You should feel a stretch behind your left / right knee. Hold this position for __________ seconds. Scoot up so your knee is supported between repetitions. Repeat __________ times. Complete this exercise __________ times a day. Knee flexion, active  Lie on your back with both legs straight. If this causes back discomfort, bend your left / right knee so your foot is flat on the floor. Slowly slide your left / right heel back toward your buttocks. Stop when you feel a gentle stretch in the front of your knee or thigh (flexion). Hold this position for __________ seconds. Slowly slide your left / right heel back to the starting position. Repeat __________ times. Complete this exercise __________ times a day. Quadriceps stretch, prone  Lie on your abdomen on a firm surface, such as a bed or padded floor. Bend your left / right knee and hold your ankle. If you cannot reach  your ankle or pant leg, loop a belt around your foot and grab the belt instead. Gently pull your heel toward your buttocks. Your knee should not slide out to the side. You should feel a stretch in the front of your thigh and knee (quadriceps). Hold this position for __________ seconds. Repeat __________ times. Complete this exercise __________ times a day. Hamstring, supine Lie on your back (supine position). Loop a belt or towel over the ball of your left / right foot. The ball of your foot is on the walking surface, right under your toes. Straighten your left / right knee and slowly pull on the belt to raise your leg until you feel a gentle stretch behind your knee (hamstring). Do not let your knee bend while you do this. Keep your other leg flat on the floor. Hold this position for __________ seconds. Repeat __________ times. Complete this exercise __________ times a day. Strengthening exercises These exercises build strength and endurance in your knee. Endurance is theability to use your muscles for a long time, even after they get tired. Quadriceps, isometric This exercise stretches the muscles in front of your thigh (quadriceps) without moving your knee joint (isometric). Lie on your back with your left / right leg extended and your other knee bent. Put a rolled towel or small pillow under your knee if told by your health care provider. Slowly tense the muscles in the front of your left / right thigh. You should see your kneecap slide up toward your hip or see increased dimpling just above the knee. This motion will   push the back of the knee toward the floor. For __________ seconds, hold the muscle as tight as you can without increasing your pain. Relax the muscles slowly and completely. Repeat __________ times. Complete this exercise __________ times a day. Straight leg raises This exercise stretches the muscles in front of your thigh (quadriceps) and the muscles that move your hips (hip  flexors). Lie on your back with your left / right leg extended and your other knee bent. Tense the muscles in the front of your left / right thigh. You should see your kneecap slide up or see increased dimpling just above the knee. Your thigh may even shake a bit. Keep these muscles tight as you raise your leg 4-6 inches (10-15 cm) off the floor. Do not let your knee bend. Hold this position for __________ seconds. Keep these muscles tense as you lower your leg. Relax your muscles slowly and completely after each repetition. Repeat __________ times. Complete this exercise __________ times a day. Hamstring, isometric Lie on your back on a firm surface. Bend your left / right knee about __________ degrees. Dig your left / right heel into the surface as if you are trying to pull it toward your buttocks. Tighten the muscles in the back of your thighs (hamstring) to "dig" as hard as you can without increasing any pain. Hold this position for __________ seconds. Release the tension gradually and allow your muscles to relax completely for __________ seconds after each repetition. Repeat __________ times. Complete this exercise __________ times a day. Hamstring curls If told by your health care provider, do this exercise while wearing ankle weights. Begin with __________ lb weights. Then increase the weight by 1 lb (0.5 kg) increments. Do not wear ankle weights that are more than __________ lb. Lie on your abdomen with your legs straight. Bend your left / right knee as far as you can without feeling pain. Keep your hips flat against the floor. Hold this position for __________ seconds. Slowly lower your leg to the starting position. Repeat __________ times. Complete this exercise __________ times a day. Squats This exercise strengthens the muscles in front of your thigh and knee (quadriceps). Stand in front of a table, with your feet and knees pointing straight ahead. You may rest your hands on the  table for balance but not for support. Slowly bend your knees and lower your hips like you are going to sit in a chair. Keep your weight over your heels, not over your toes. Keep your lower legs upright so they are parallel with the table legs. Do not let your hips go lower than your knees. Do not bend lower than told by your health care provider. If your knee pain increases, do not bend as low. Hold the squat position for __________ seconds. Slowly push with your legs to return to standing. Do not use your hands to pull yourself to standing. Repeat __________ times. Complete this exercise __________ times a day. Wall slides This exercise strengthens the muscles in front of your thigh and knee (quadriceps). Lean your back against a smooth wall or door, and walk your feet out 18-24 inches (46-61 cm) from it. Place your feet hip-width apart. Slowly slide down the wall or door until your knees bend __________ degrees. Keep your knees over your heels, not over your toes. Keep your knees in line with your hips. Hold this position for __________ seconds. Repeat __________ times. Complete this exercise __________ times a day. Straight leg raises This exercise   strengthens the muscles that rotate the leg at the hip and move it away from your body (hip abductors). Lie on your side with your left / right leg in the top position. Lie so your head, shoulder, knee, and hip line up. You may bend your bottom knee to help you keep your balance. Roll your hips slightly forward so your hips are stacked directly over each other and your left / right knee is facing forward. Leading with your heel, lift your top leg 4-6 inches (10-15 cm). You should feel the muscles in your outer hip lifting. Do not let your foot drift forward. Do not let your knee roll toward the ceiling. Hold this position for __________ seconds. Slowly return your leg to the starting position. Let your muscles relax completely after each  repetition. Repeat __________ times. Complete this exercise __________ times a day. Straight leg raises This exercise stretches the muscles that move your hips away from the front of the pelvis (hip extensors). Lie on your abdomen on a firm surface. You can put a pillow under your hips if that is more comfortable. Tense the muscles in your buttocks and lift your left / right leg about 4-6 inches (10-15 cm). Keep your knee straight as you lift your leg. Hold this position for __________ seconds. Slowly lower your leg to the starting position. Let your leg relax completely after each repetition. Repeat __________ times. Complete this exercise __________ times a day. This information is not intended to replace advice given to you by your health care provider. Make sure you discuss any questions you have with your healthcare provider. Document Revised: 02/07/2018 Document Reviewed: 02/07/2018 [employer] Patient Education  2022 [employer] Hand Exercises Hand exercises can be helpful for almost anyone. These exercises can strengthen the hands, improve flexibility and movement, and increase blood flow to the hands. These results can make work and daily tasks easier. Hand exercises can be especially helpful for people who have joint pain from arthritis or have nerve damage from overuse (carpal tunnel syndrome). These exercises can also help people who have injured a hand. Exercises Most of these hand exercises are gentle stretching and motion exercises. It is usually safe to do them often throughout the day. Warming up your hands before exercise may help to reduce stiffness. You can do this with gentle massage orby placing your hands in warm water for 10-15 minutes. It is normal to feel some stretching, pulling, tightness, or mild discomfort as you begin new exercises. This will gradually improve. Stop an exercise right away if you feel sudden, severe pain or your pain gets worse. Ask your healthcare  provider which exercises are best for you. Knuckle bend or "claw" fist Stand or sit with your arm, hand, and all five fingers pointed straight up. Make sure to keep your wrist straight during the exercise. Gently bend your fingers down toward your palm until the tips of your fingers are touching the top of your palm. Keep your big knuckle straight and just bend the small knuckles in your fingers. Hold this position for __________ seconds. Straighten (extend) your fingers back to the starting position. Repeat this exercise 5-10 times with each hand. Full finger fist Stand or sit with your arm, hand, and all five fingers pointed straight up. Make sure to keep your wrist straight during the exercise. Gently bend your fingers into your palm until the tips of your fingers are touching the middle of your palm. Hold this position for __________ seconds.   Extend your fingers back to the starting position, stretching every joint fully. Repeat this exercise 5-10 times with each hand. Straight fist Stand or sit with your arm, hand, and all five fingers pointed straight up. Make sure to keep your wrist straight during the exercise. Gently bend your fingers at the big knuckle, where your fingers meet your hand, and the middle knuckle. Keep the knuckle at the tips of your fingers straight and try to touch the bottom of your palm. Hold this position for __________ seconds. Extend your fingers back to the starting position, stretching every joint fully. Repeat this exercise 5-10 times with each hand. Tabletop Stand or sit with your arm, hand, and all five fingers pointed straight up. Make sure to keep your wrist straight during the exercise. Gently bend your fingers at the big knuckle, where your fingers meet your hand, as far down as you can while keeping the small knuckles in your fingers straight. Think of forming a tabletop with your fingers. Hold this position for __________ seconds. Extend your fingers  back to the starting position, stretching every joint fully. Repeat this exercise 5-10 times with each hand. Finger spread Place your hand flat on a table with your palm facing down. Make sure your wrist stays straight as you do this exercise. Spread your fingers and thumb apart from each other as far as you can until you feel a gentle stretch. Hold this position for __________ seconds. Bring your fingers and thumb tight together again. Hold this position for __________ seconds. Repeat this exercise 5-10 times with each hand. Making circles Stand or sit with your arm, hand, and all five fingers pointed straight up. Make sure to keep your wrist straight during the exercise. Make a circle by touching the tip of your thumb to the tip of your index finger. Hold for __________ seconds. Then open your hand wide. Repeat this motion with your thumb and each finger on your hand. Repeat this exercise 5-10 times with each hand. Thumb motion Sit with your forearm resting on a table and your wrist straight. Your thumb should be facing up toward the ceiling. Keep your fingers relaxed as you move your thumb. Lift your thumb up as high as you can toward the ceiling. Hold for __________ seconds. Bend your thumb across your palm as far as you can, reaching the tip of your thumb for the small finger (pinkie) side of your palm. Hold for __________ seconds. Repeat this exercise 5-10 times with each hand. Grip strengthening  Hold a stress ball or other soft ball in the middle of your hand. Slowly increase the pressure, squeezing the ball as much as you can without causing pain. Think of bringing the tips of your fingers into the middle of your palm. All of your finger joints should bend when doing this exercise. Hold your squeeze for __________ seconds, then relax. Repeat this exercise 5-10 times with each hand. Contact a health care provider if: Your hand pain or discomfort gets much worse when you do an  exercise. Your hand pain or discomfort does not improve within 2 hours after you exercise. If you have any of these problems, stop doing these exercises right away. Do not do them again unless your health care provider says that you can. Get help right away if: You develop sudden, severe hand pain or swelling. If this happens, stop doing these exercises right away. Do not do them again unless your health care provider says that you can. This   information is not intended to replace advice given to you by your health care provider. Make sure you discuss any questions you have with your healthcare provider. Document Revised: 08/10/2018 Document Reviewed: 04/20/2018 [employer] Patient Education  2022 [employer]  

## 2020-11-19 ENCOUNTER — Encounter: Payer: Self-pay | Admitting: Rheumatology

## 2020-11-19 DIAGNOSIS — M2550 Pain in unspecified joint: Secondary | ICD-10-CM

## 2020-11-19 DIAGNOSIS — R5383 Other fatigue: Secondary | ICD-10-CM

## 2020-11-19 DIAGNOSIS — R768 Other specified abnormal immunological findings in serum: Secondary | ICD-10-CM

## 2020-11-20 ENCOUNTER — Institutional Professional Consult (permissible substitution): Payer: 59 | Admitting: Neurology

## 2020-11-20 NOTE — Telephone Encounter (Signed)
I would recommend referral to neurology for evaluation of multiple sclerosis.

## 2020-11-24 LAB — C3 AND C4
C3 Complement: 151 mg/dL (ref 83–193)
C4 Complement: 23 mg/dL (ref 15–57)

## 2020-11-24 LAB — RNP ANTIBODY: Ribonucleic Protein(ENA) Antibody, IgG: <1 AI

## 2020-11-24 LAB — URINALYSIS, ROUTINE W REFLEX MICROSCOPIC
Bilirubin Urine: NEGATIVE
Glucose, UA: NEGATIVE
Hgb urine dipstick: NEGATIVE
Ketones, ur: NEGATIVE
Leukocytes,Ua: NEGATIVE
Nitrite: NEGATIVE
Protein, ur: NEGATIVE
Specific Gravity, Urine: 1.01 (ref 1.001–1.035)
pH: 7 (ref 5.0–8.0)

## 2020-11-24 LAB — LUPUS ANTICOAGULANT EVAL W/ REFLEX
PTT-LA Screen: 34 s (ref <=40)
dRVVT: 35 s (ref <=45)

## 2020-11-24 LAB — PROTEIN ELECTROPHORESIS, SERUM, WITH REFLEX
Albumin ELP: 3.9 g/dL (ref 3.8–4.8)
Alpha 1: 0.3 g/dL (ref 0.2–0.3)
Alpha 2: 0.8 g/dL (ref 0.5–0.9)
Beta 2: 0.3 g/dL (ref 0.2–0.5)
Beta Globulin: 0.5 g/dL (ref 0.4–0.6)
Gamma Globulin: 0.5 g/dL — ABNORMAL LOW (ref 0.8–1.7)
Total Protein: 6.2 g/dL (ref 6.1–8.1)

## 2020-11-24 LAB — ANTI-SCLERODERMA ANTIBODY: Scleroderma (Scl-70) (ENA) Antibody, IgG: <1 AI

## 2020-11-24 LAB — BETA-2 GLYCOPROTEIN ANTIBODIES
Beta-2 Glyco 1 IgA: <2 U/mL
Beta-2 Glyco 1 IgM: <2 U/mL
Beta-2 Glyco I IgG: <2 U/mL

## 2020-11-24 LAB — IFE INTERPRETATION: Immunofix Electr Int: NOT DETECTED

## 2020-11-24 LAB — SJOGRENS SYNDROME-B EXTRACTABLE NUCLEAR ANTIBODY: SSB (La) (ENA) Antibody, IgG: <1 AI

## 2020-11-24 LAB — SJOGRENS SYNDROME-A EXTRACTABLE NUCLEAR ANTIBODY: SSA (Ro) (ENA) Antibody, IgG: <1 AI

## 2020-11-24 LAB — CARDIOLIPIN ANTIBODIES, IGG, IGM, IGA
Anticardiolipin IgA: <2 APL-U/mL
Anticardiolipin IgG: <2 GPL-U/mL
Anticardiolipin IgM: <2 MPL-U/mL

## 2020-11-24 LAB — ANTI-SMITH ANTIBODY: ENA SM Ab Ser-aCnc: <1 AI

## 2020-11-24 LAB — ANTI-DNA ANTIBODY, DOUBLE-STRANDED: ds DNA Ab: 1 IU/mL

## 2020-11-24 NOTE — Progress Notes (Signed)
All the labs are within normal limits.  I will discuss results at the follow-up visit.

## 2020-12-03 ENCOUNTER — Telehealth: Payer: Self-pay

## 2020-12-03 NOTE — Telephone Encounter (Signed)
Patient No Showed to her home sleep test pick up appt.

## 2020-12-06 ENCOUNTER — Other Ambulatory Visit (INDEPENDENT_AMBULATORY_CARE_PROVIDER_SITE_OTHER): Payer: Self-pay | Admitting: Internal Medicine

## 2020-12-08 ENCOUNTER — Telehealth: Payer: Self-pay | Admitting: Neurology

## 2020-12-08 NOTE — Telephone Encounter (Signed)
Returned pt's call no answer LVM for pt to call me back to schedule sleep study

## 2020-12-11 ENCOUNTER — Ambulatory Visit: Payer: 59 | Admitting: Rheumatology

## 2021-01-26 ENCOUNTER — Institutional Professional Consult (permissible substitution): Payer: 59 | Admitting: Neurology

## 2021-02-04 NOTE — Progress Notes (Signed)
Office Visit Note  Patient: Lisa Ryan             Date of Birth: 1976-01-06           MRN: [id number]             PCP: Doree Albee, MD (Inactive) Referring: Doree Albee, MD Visit Date: 02/18/2021 Occupation: @GUAROCC @  Subjective:  Pain in multiple joints   History of Present Illness: Lisa Ryan is a 46 y.o. female with a history of osteoarthritis, fibromyalgia and positive ANA.  She states she continues to have pain and discomfort in almost all of her joints.  She has been under a lot of stress due to her husband's health.  She also fell on Saturday and injured her left knee.  She had surgery in the past.  She has an appointment coming up with the orthopedic surgeon.  She continues to have pain and discomfort in her hands, lower back and her knee joints.  She also has generalized pain from fibromyalgia.  She states her symptoms are usually worse during the winter months.  She denies any history of oral ulcers, nasal ulcers, malar rash, photosensitivity or Raynaud's phenomenon.  Activities of Daily Living:  Patient reports morning stiffness for 20 minutes.   Patient Reports nocturnal pain.  Difficulty dressing/grooming: Denies Difficulty climbing stairs: Reports Difficulty getting out of chair: Denies Difficulty using hands for taps, buttons, cutlery, and/or writing: Reports  Review of Systems  Constitutional:  Positive for fatigue.  HENT:  Negative for mouth sores, mouth dryness and nose dryness.   Eyes:  Negative for pain, itching and dryness.  Respiratory:  Positive for shortness of breath. Negative for difficulty breathing.   Cardiovascular:  Negative for chest pain and palpitations.  Gastrointestinal:  Negative for blood in stool, constipation and diarrhea.  Endocrine: Negative for increased urination.  Genitourinary:  Negative for difficulty urinating.  Musculoskeletal:  Positive for joint pain, joint pain, joint swelling, myalgias, morning stiffness,  muscle tenderness and myalgias.  Skin:  Negative for color change, rash and redness.  Allergic/Immunologic: Positive for susceptible to infections.  Neurological:  Positive for dizziness. Negative for numbness, headaches and memory loss.  Hematological:  Negative for bruising/bleeding tendency.  Psychiatric/Behavioral:  Negative for confusion.    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   Cough 10/08/2013   Bronchitis 10/05/2013   Reactive airway disease 10/05/2013   Former heavy tobacco smoker 10/05/2013   Unspecified disorder of thyroid 04/18/2012   Flushing 03/07/2012   Atypical chest pain 03/07/2012   Gastritis 07/06/2011   Family hx of colon cancer 07/06/2011   Migraine headache 03/04/2010   PEDAL EDEMA 02/18/2010   Anxiety 10/12/2007   PEPTIC ULCER DISEASE 09/28/2007   SEIZURE DISORDER 09/28/2007   WEIGHT GAIN 09/28/2007    Past Medical History:  Diagnosis Date   Anemia    Anginal pain ([place])    due to asthma meds   Anxiety state, unspecified 10/12/2007   Arthritis  Asthma    CHEST PAIN, ATYPICAL 02/18/2010   after using albuterol   CHEST PAIN-PRECORDIAL 02/19/2010   after albuterol   CHF (congestive heart failure) ([place]) 1999   During Pregnancy, After C section   COPD (chronic obstructive pulmonary disease) ([place])    Diverticulosis    FATIGUE 09/28/2007   Fatty liver    GASTROENTERITIS 10/03/2008   GERD (gastroesophageal reflux disease)    occ takes OTC ttums   History of vitamin D deficiency     Hypothyroidism    MIGRAINE HEADACHE 03/04/2010   Obesity    PEDAL EDEMA 02/18/2010   comes and goes    PEPTIC ULCER DISEASE 09/28/2007   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   PONV (postoperative nausea and vomiting)    SEIZURE DISORDER 09/28/2007   SUI (stress urinary incontinence, female)    Thyroid nodule    Tobacco abuse    WEIGHT GAIN 09/28/2007    Family History  Problem Relation Age of Onset   Cerebral aneurysm Mother    Colon cancer Mother    Rheum arthritis Mother    Heart disease Father    Liver disease Father    Diabetes Sister    Atrial fibrillation Sister    Hypertension Sister    Healthy Sister    Muscular dystrophy Brother    Diabetes Brother    Colon cancer Maternal Aunt    Kidney disease Maternal Grandmother    Colon cancer Maternal Grandmother    Healthy Son    Past Surgical History:  Procedure Laterality Date   ANKLE SURGERY     left   APPENDECTOMY     CARDIAC CATHETERIZATION  2015   CESAREAN SECTION     x3   CHOLECYSTECTOMY     DILITATION & CURRETTAGE/HYSTROSCOPY WITH NOVASURE ABLATION N/A 12/26/2015   Procedure: DILATATION & CURETTAGE/HYSTEROSCOPY WITH ENDOMETRIAL ABLATION;  Surgeon: Jonnie Kind, MD;  Location: [facility];  Service: Gynecology;  Laterality: N/A;   ESOPHAGOGASTRODUODENOSCOPY  2012   KNEE ARTHROSCOPY Left    x2   MOUTH LESION EXCISIONAL BIOPSY  04/2011   pre-cancerous, pallet of mouth   SHOULDER ARTHROSCOPY WITH ROTATOR CUFF REPAIR Right 08/16/2019   Procedure: Right shoulder arthroscopy, subacromial decompression, distal clavicle resection, rotator cuff repair;  Surgeon: Justice Britain, MD;  Location: [facility];  Service: Orthopedics;  Laterality: Right;  130min   SHOULDER ARTHROSCOPY WITH ROTATOR CUFF REPAIR Right 10/02/2019   Procedure: SHOULDER ARTHROSCOPY WITH ROTATOR CUFF REPAIR;  Surgeon: Justice Britain, MD;  Location: [facility];  Service: Orthopedics;  Laterality: Right;  70min   Social History   Social History  Narrative   Not on file   Immunization History  Administered Date(s) Administered   Moderna Sars-Covid-2 Vaccination 07/04/2019, 07/18/2019, 11/09/2019   Pneumococcal Conjugate-13 10/30/2013     Objective: Vital Signs: BP (!) 142/88 (BP Location: Left Arm, Patient Position: Sitting, Cuff Size: Normal)   Pulse 71   Ht 5\' 7"  (1.702 m)   Wt 182 lb 6.4 oz (82.7 kg)   BMI 28.57 kg/m    Physical Exam Vitals and nursing note reviewed.  Constitutional:      Appearance: She is well-developed.  HENT:     Head: Normocephalic and atraumatic.  Eyes:     Conjunctiva/sclera: Conjunctivae normal.  Cardiovascular:     Rate and Rhythm: Normal rate and regular rhythm.     Heart sounds: Normal heart sounds.  Pulmonary:     Effort: Pulmonary effort is normal.     Breath  sounds: Normal breath sounds.  Abdominal:     General: Bowel sounds are normal.     Palpations: Abdomen is soft.  Musculoskeletal:     Cervical back: Normal range of motion.  Lymphadenopathy:     Cervical: No cervical adenopathy.  Skin:    General: Skin is warm and dry.     Capillary Refill: Capillary refill takes less than 2 seconds.  Neurological:     Mental Status: She is alert and oriented to person, place, and time.  Psychiatric:        Behavior: Behavior normal.     Musculoskeletal Exam: C-spine was in good range of motion.  She had painful range of motion of her lumbar spine.  Shoulder joints, elbow joints, wrist joints with good range of motion.  There was no synovitis or MCPs PIPs or DIPs.  She has some PIP and DIP prominence consistent with osteoarthritis.  Hip joints and knee joints with good range of motion.  She had discomfort range of motion of her left knee joint without any warmth swelling or effusion.  There was no tenderness over ankles or MTPs.  CDAI Exam: CDAI Score: -- Patient Global: --; Provider Global: -- Swollen: --; Tender: -- Joint Exam 02/18/2021   No joint exam has been documented for  this visit   There is currently no information documented on the homunculus. Go to the Rheumatology activity and complete the homunculus joint exam.  Investigation: No additional findings.  Imaging: No results found.  Recent Labs: Lab Results  Component Value Date   WBC 9.1 10/16/2020   HGB 14.1 10/16/2020   PLT 366 10/16/2020   NA 139 10/16/2020   K 4.6 10/16/2020   CL 106 10/16/2020   CO2 24 10/16/2020   GLUCOSE 87 10/16/2020   BUN 17 10/16/2020   CREATININE 0.67 10/16/2020   BILITOT 0.3 10/16/2020   ALKPHOS 71 07/03/2018   AST 10 10/16/2020   ALT 17 10/16/2020   PROT 6.2 11/18/2020   ALBUMIN 3.8 07/03/2018   CALCIUM 9.3 10/16/2020   GFRAA 121 10/16/2020   November 18, 2020 UA negative, IFE negative, ENA negative, C3-C4 normal, anticardiolipin negative, beta-2 GP 1 negative, lupus anticoagulant negative.   Speciality Comments: No specialty comments available.  Procedures:  No procedures performed Allergies: Albuterol, Hydrocodone-acetaminophen, Morphine, and Adhesive [tape]   Assessment / Plan:     Visit Diagnoses: Primary osteoarthritis of both hands - History of recurrent swelling in bilateral hands.  She has had several courses of prednisone.  Clinical and radiographic findings were consistent with osteoarthritis.  She had no synovitis on examination.  All her autoimmune work-up is negative.  I have discouraged the use of prednisone.  I gave her a handout on hand exercises.  S/P right rotator cuff repair - x2.  Primary osteoarthritis of right knee - Moderate osteoarthritis and moderate chondromalacia patella.  She continues to have pain and discomfort in her knee joints.  No warmth swelling or effusion was noted.  She had a recent fall and injured her left knee joint.  She is an appointment coming up with the orthopedic surgeon.  A handout on lower extremity exercises was given.  Chronic pain of both feet - Clinical and radiographic findings are consistent with  osteoarthritis.  Proper fitting shoes were discussed.  Chronic midline low back pain without sciatica -she gives history of chronic back pain.  She has no radiculopathy.  She had discomfort range of motion of her lumbar spine.  Plan: Ambulatory  referral to Physical Therapy  Fibromyalgia -she has known history of fibromyalgia with generalized pain and discomfort.  She has positive tender points.  She states the pain is worse due to increased stress.  Need for regular exercise was emphasized.  I will refer her to physical therapy.  Plan: Ambulatory referral to Physical Therapy  Positive ANA (antinuclear antibody) - she has positive ANA.  There is no history of oral ulcers, nasal ulcers, sicca symptoms, Raynaud's phenomenon, photosensitivity or lymphadenopathy.  All autoimmune work-up including ANA, C3-C4, anticoagulant antibodies were negative.  She does not have autoimmune disease.  Other fatigue-most likely related to fibromyalgia.  Family history of rheumatoid arthritis - Mother.  Smoker-smoking cessation was discussed.  She may benefit with the use of Wellbutrin.  She states she is very tired during the day.  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  Family hx of colon cancer  Orders: Orders Placed This Encounter  Procedures   Ambulatory referral to Physical Therapy    No orders of the defined types were placed in this encounter.   Follow-Up Instructions: Return in about 1 year (around 02/18/2022) for Osteoarthritis.   Bo Merino, MD  Note - This record has been created using Editor, commissioning.  Chart creation errors have been sought, but may not always  have been located. Such creation errors do not reflect on  the standard of medical care.

## 2021-02-09 ENCOUNTER — Telehealth: Payer: Self-pay | Admitting: Neurology

## 2021-02-09 ENCOUNTER — Institutional Professional Consult (permissible substitution): Payer: 59 | Admitting: Neurology

## 2021-02-09 NOTE — Telephone Encounter (Signed)
noted 

## 2021-02-09 NOTE — Telephone Encounter (Signed)
FYI- pt called to cancel appt, not feeling well this morning.

## 2021-02-18 ENCOUNTER — Ambulatory Visit (INDEPENDENT_AMBULATORY_CARE_PROVIDER_SITE_OTHER): Payer: 59 | Admitting: Rheumatology

## 2021-02-18 ENCOUNTER — Other Ambulatory Visit: Payer: Self-pay

## 2021-02-18 ENCOUNTER — Encounter: Payer: Self-pay | Admitting: Rheumatology

## 2021-02-18 VITALS — BP 142/88 | HR 71 | Ht 67.0 in | Wt 182.4 lb

## 2021-02-18 DIAGNOSIS — F17200 Nicotine dependence, unspecified, uncomplicated: Secondary | ICD-10-CM

## 2021-02-18 DIAGNOSIS — M19041 Primary osteoarthritis, right hand: Secondary | ICD-10-CM

## 2021-02-18 DIAGNOSIS — Z8261 Family history of arthritis: Secondary | ICD-10-CM

## 2021-02-18 DIAGNOSIS — K279 Peptic ulcer, site unspecified, unspecified as acute or chronic, without hemorrhage or perforation: Secondary | ICD-10-CM

## 2021-02-18 DIAGNOSIS — R768 Other specified abnormal immunological findings in serum: Secondary | ICD-10-CM

## 2021-02-18 DIAGNOSIS — M79671 Pain in right foot: Secondary | ICD-10-CM

## 2021-02-18 DIAGNOSIS — R7689 Other specified abnormal immunological findings in serum: Secondary | ICD-10-CM

## 2021-02-18 DIAGNOSIS — Z9889 Other specified postprocedural states: Secondary | ICD-10-CM | POA: Diagnosis not present

## 2021-02-18 DIAGNOSIS — M1711 Unilateral primary osteoarthritis, right knee: Secondary | ICD-10-CM | POA: Diagnosis not present

## 2021-02-18 DIAGNOSIS — M19042 Primary osteoarthritis, left hand: Secondary | ICD-10-CM

## 2021-02-18 DIAGNOSIS — M79672 Pain in left foot: Secondary | ICD-10-CM

## 2021-02-18 DIAGNOSIS — R5383 Other fatigue: Secondary | ICD-10-CM

## 2021-02-18 DIAGNOSIS — Z8709 Personal history of other diseases of the respiratory system: Secondary | ICD-10-CM

## 2021-02-18 DIAGNOSIS — M545 Low back pain, unspecified: Secondary | ICD-10-CM

## 2021-02-18 DIAGNOSIS — Z800 Family history of malignant neoplasm of digestive organs: Secondary | ICD-10-CM

## 2021-02-18 DIAGNOSIS — R7611 Nonspecific reaction to tuberculin skin test without active tuberculosis: Secondary | ICD-10-CM

## 2021-02-18 DIAGNOSIS — G8929 Other chronic pain: Secondary | ICD-10-CM

## 2021-02-18 DIAGNOSIS — M797 Fibromyalgia: Secondary | ICD-10-CM

## 2021-02-18 NOTE — Patient Instructions (Signed)
Back Exercises The following exercises strengthen the muscles that help to support the trunk (torso) and back. They also help to keep the lower back flexible. Doing these exercises can help to prevent or lessen existing low back pain. If you have back pain or discomfort, try doing these exercises 2-3 times each day or as told by your health care provider. As your pain improves, do them once each day, but increase the number of times that you repeat the steps for each exercise (do more repetitions). To prevent the recurrence of back pain, continue to do these exercises once each day or as told by your health care provider. Do exercises exactly as told by your health care provider and adjust them as directed. It is normal to feel mild stretching, pulling, tightness, or discomfort as you do these exercises, but you should stop right away if you feel sudden pain or your pain gets worse. Exercises Single knee to chest Repeat these steps 3-5 times for each leg: Lie on your back on a firm bed or the floor with your legs extended. Bring one knee to your chest. Your other leg should stay extended and in contact with the floor. Hold your knee in place by grabbing your knee or thigh with both hands and hold. Pull on your knee until you feel a gentle stretch in your lower back or buttocks. Hold the stretch for 10-30 seconds. Slowly release and straighten your leg. Pelvic tilt Repeat these steps 5-10 times: Lie on your back on a firm bed or the floor with your legs extended. Bend your knees so they are pointing toward the ceiling and your feet are flat on the floor. Tighten your lower abdominal muscles to press your lower back against the floor. This motion will tilt your pelvis so your tailbone points up toward the ceiling instead of pointing to your feet or the floor. With gentle tension and even breathing, hold this position for 5-10 seconds. Cat-cow Repeat these steps until your lower back becomes more  flexible: Get into a hands-and-knees position on a firm bed or the floor. Keep your hands under your shoulders, and keep your knees under your hips. You may place padding under your knees for comfort. Let your head hang down toward your chest. Contract your abdominal muscles and point your tailbone toward the floor so your lower back becomes rounded like the back of a cat. Hold this position for 5 seconds. Slowly lift your head, let your abdominal muscles relax, and point your tailbone up toward the ceiling so your back forms a sagging arch like the back of a cow. Hold this position for 5 seconds.  Press-ups Repeat these steps 5-10 times: Lie on your abdomen (face-down) on a firm bed or the floor. Place your palms near your head, about shoulder-width apart. Keeping your back as relaxed as possible and keeping your hips on the floor, slowly straighten your arms to raise the top half of your body and lift your shoulders. Do not use your back muscles to raise your upper torso. You may adjust the placement of your hands to make yourself more comfortable. Hold this position for 5 seconds while you keep your back relaxed. Slowly return to lying flat on the floor.  Bridges Repeat these steps 10 times: Lie on your back on a firm bed or the floor. Bend your knees so they are pointing toward the ceiling and your feet are flat on the floor. Your arms should be flat at your   sides, next to your body. Tighten your buttocks muscles and lift your buttocks off the floor until your waist is at almost the same height as your knees. You should feel the muscles working in your buttocks and the back of your thighs. If you do not feel these muscles, slide your feet 1-2 inches (2.5-5 cm) farther away from your buttocks. Hold this position for 3-5 seconds. Slowly lower your hips to the starting position, and allow your buttocks muscles to relax completely. If this exercise is too easy, try doing it with your arms  crossed over your chest. Abdominal crunches Repeat these steps 5-10 times: Lie on your back on a firm bed or the floor with your legs extended. Bend your knees so they are pointing toward the ceiling and your feet are flat on the floor. Cross your arms over your chest. Tip your chin slightly toward your chest without bending your neck. Tighten your abdominal muscles and slowly raise your torso high enough to lift your shoulder blades a tiny bit off the floor. Avoid raising your torso higher than that because it can put too much stress on your lower back and does not help to strengthen your abdominal muscles. Slowly return to your starting position. Back lifts Repeat these steps 5-10 times: Lie on your abdomen (face-down) with your arms at your sides, and rest your forehead on the floor. Tighten the muscles in your legs and your buttocks. Slowly lift your chest off the floor while you keep your hips pressed to the floor. Keep the back of your head in line with the curve in your back. Your eyes should be looking at the floor. Hold this position for 3-5 seconds. Slowly return to your starting position. Contact a health care provider if: Your back pain or discomfort gets much worse when you do an exercise. Your worsening back pain or discomfort does not lessen within 2 hours after you exercise. If you have any of these problems, stop doing these exercises right away. Do not do them again unless your health care provider says that you can. Get help right away if: You develop sudden, severe back pain. If this happens, stop doing the exercises right away. Do not do them again unless your health care provider says that you can. This information is not intended to replace advice given to you by your health care provider. Make sure you discuss any questions you have with your health care provider. Document Revised: 07/02/2020 Document Reviewed: 07/02/2020 Else[employer] Patient Education  [place]. Knee Exercises Ask your health care provider which exercises are safe for you. Do exercises exactly as told by your health care provider and adjust them as directed. It is normal to feel mild stretching, pulling, tightness, or discomfort as you do these exercises. Stop right away if you feel sudden pain or your pain gets worse. Do not begin these exercises until told by your health care provider. Stretching and range-of-motion exercises These exercises warm up your muscles and joints and improve the movement and flexibility of your knee. These exercises also help to relieve pain and swelling. Knee extension, prone Lie on your abdomen (prone position) on a bed. Place your left / right knee just beyond the edge of the surface so your knee is not on the bed. You can put a towel under your left / right thigh just above your kneecap for comfort. Relax your leg muscles and allow gravity to straighten your knee (extension). You should feel a  stretch behind your left / right knee. Hold this position for __________ seconds. Scoot up so your knee is supported between repetitions. Repeat __________ times. Complete this exercise __________ times a day. Knee flexion, active  Lie on your back with both legs straight. If this causes back discomfort, bend your left / right knee so your foot is flat on the floor. Slowly slide your left / right heel back toward your buttocks. Stop when you feel a gentle stretch in the front of your knee or thigh (flexion). Hold this position for __________ seconds. Slowly slide your left / right heel back to the starting position. Repeat __________ times. Complete this exercise __________ times a day. Quadriceps stretch, prone  Lie on your abdomen on a firm surface, such as a bed or padded floor. Bend your left / right knee and hold your ankle. If you cannot reach your ankle or pant leg, loop a belt around your foot and grab the belt instead. Gently pull your heel toward  your buttocks. Your knee should not slide out to the side. You should feel a stretch in the front of your thigh and knee (quadriceps). Hold this position for __________ seconds. Repeat __________ times. Complete this exercise __________ times a day. Hamstring, supine Lie on your back (supine position). Loop a belt or towel over the ball of your left / right foot. The ball of your foot is on the walking surface, right under your toes. Straighten your left / right knee and slowly pull on the belt to raise your leg until you feel a gentle stretch behind your knee (hamstring). Do not let your knee bend while you do this. Keep your other leg flat on the floor. Hold this position for __________ seconds. Repeat __________ times. Complete this exercise __________ times a day. Strengthening exercises These exercises build strength and endurance in your knee. Endurance is the ability to use your muscles for a long time, even after they get tired. Quadriceps, isometric This exercise stretches the muscles in front of your thigh (quadriceps) without moving your knee joint (isometric). Lie on your back with your left / right leg extended and your other knee bent. Put a rolled towel or small pillow under your knee if told by your health care provider. Slowly tense the muscles in the front of your left / right thigh. You should see your kneecap slide up toward your hip or see increased dimpling just above the knee. This motion will push the back of the knee toward the floor. For __________ seconds, hold the muscle as tight as you can without increasing your pain. Relax the muscles slowly and completely. Repeat __________ times. Complete this exercise __________ times a day. Straight leg raises This exercise stretches the muscles in front of your thigh (quadriceps) and the muscles that move your hips (hip flexors). Lie on your back with your left / right leg extended and your other knee bent. Tense the muscles  in the front of your left / right thigh. You should see your kneecap slide up or see increased dimpling just above the knee. Your thigh may even shake a bit. Keep these muscles tight as you raise your leg 4-6 inches (10-15 cm) off the floor. Do not let your knee bend. Hold this position for __________ seconds. Keep these muscles tense as you lower your leg. Relax your muscles slowly and completely after each repetition. Repeat __________ times. Complete this exercise __________ times a day. Hamstring, isometric Lie on your back  on a firm surface. Bend your left / right knee about __________ degrees. Dig your left / right heel into the surface as if you are trying to pull it toward your buttocks. Tighten the muscles in the back of your thighs (hamstring) to "dig" as hard as you can without increasing any pain. Hold this position for __________ seconds. Release the tension gradually and allow your muscles to relax completely for __________ seconds after each repetition. Repeat __________ times. Complete this exercise __________ times a day. Hamstring curls If told by your health care provider, do this exercise while wearing ankle weights. Begin with __________ lb weights. Then increase the weight by 1 lb (0.5 kg) increments. Do not wear ankle weights that are more than __________ lb. Lie on your abdomen with your legs straight. Bend your left / right knee as far as you can without feeling pain. Keep your hips flat against the floor. Hold this position for __________ seconds. Slowly lower your leg to the starting position. Repeat __________ times. Complete this exercise __________ times a day. Squats This exercise strengthens the muscles in front of your thigh and knee (quadriceps). Stand in front of a table, with your feet and knees pointing straight ahead. You may rest your hands on the table for balance but not for support. Slowly bend your knees and lower your hips like you are going to sit  in a chair. Keep your weight over your heels, not over your toes. Keep your lower legs upright so they are parallel with the table legs. Do not let your hips go lower than your knees. Do not bend lower than told by your health care provider. If your knee pain increases, do not bend as low. Hold the squat position for __________ seconds. Slowly push with your legs to return to standing. Do not use your hands to pull yourself to standing. Repeat __________ times. Complete this exercise __________ times a day. Wall slides This exercise strengthens the muscles in front of your thigh and knee (quadriceps). Lean your back against a smooth wall or door, and walk your feet out 18-24 inches (46-61 cm) from it. Place your feet hip-width apart. Slowly slide down the wall or door until your knees bend __________ degrees. Keep your knees over your heels, not over your toes. Keep your knees in line with your hips. Hold this position for __________ seconds. Repeat __________ times. Complete this exercise __________ times a day. Straight leg raises This exercise strengthens the muscles that rotate the leg at the hip and move it away from your body (hip abductors). Lie on your side with your left / right leg in the top position. Lie so your head, shoulder, knee, and hip line up. You may bend your bottom knee to help you keep your balance. Roll your hips slightly forward so your hips are stacked directly over each other and your left / right knee is facing forward. Leading with your heel, lift your top leg 4-6 inches (10-15 cm). You should feel the muscles in your outer hip lifting. Do not let your foot drift forward. Do not let your knee roll toward the ceiling. Hold this position for __________ seconds. Slowly return your leg to the starting position. Let your muscles relax completely after each repetition. Repeat __________ times. Complete this exercise __________ times a day. Straight leg raises This  exercise stretches the muscles that move your hips away from the front of the pelvis (hip extensors). Lie on your abdomen on a  firm surface. You can put a pillow under your hips if that is more comfortable. Tense the muscles in your buttocks and lift your left / right leg about 4-6 inches (10-15 cm). Keep your knee straight as you lift your leg. Hold this position for __________ seconds. Slowly lower your leg to the starting position. Let your leg relax completely after each repetition. Repeat __________ times. Complete this exercise __________ times a day. This information is not intended to replace advice given to you by your health care provider. Make sure you discuss any questions you have with your health care provider. Document Revised: 02/07/2018 Document Reviewed: 02/07/2018 Elsevier Patient Education  [place] Exercises Hand exercises can be helpful for almost anyone. These exercises can strengthen the hands, improve flexibility and movement, and increase blood flow to the hands. These results can make work and daily tasks easier. Hand exercises can be especially helpful for people who have joint pain from arthritis or have nerve damage from overuse (carpal tunnel syndrome). These exercises can also help people who have injured a hand. Exercises Most of these hand exercises are gentle stretching and motion exercises. It is usually safe to do them often throughout the day. Warming up your hands before exercise may help to reduce stiffness. You can do this with gentle massage or by placing your hands in warm water for 10-15 minutes. It is normal to feel some stretching, pulling, tightness, or mild discomfort as you begin new exercises. This will gradually improve. Stop an exercise right away if you feel sudden, severe pain or your pain gets worse. Ask your health care provider which exercises are best for you. Knuckle bend or "claw" fist  Stand or sit with your arm, hand,  and all five fingers pointed straight up. Make sure to keep your wrist straight during the exercise. Gently bend your fingers down toward your palm until the tips of your fingers are touching the top of your palm. Keep your big knuckle straight and just bend the small knuckles in your fingers. Hold this position for __________ seconds. Straighten (extend) your fingers back to the starting position. Repeat this exercise 5-10 times with each hand. Full finger fist  Stand or sit with your arm, hand, and all five fingers pointed straight up. Make sure to keep your wrist straight during the exercise. Gently bend your fingers into your palm until the tips of your fingers are touching the middle of your palm. Hold this position for __________ seconds. Extend your fingers back to the starting position, stretching every joint fully. Repeat this exercise 5-10 times with each hand. Straight fist Stand or sit with your arm, hand, and all five fingers pointed straight up. Make sure to keep your wrist straight during the exercise. Gently bend your fingers at the big knuckle, where your fingers meet your hand, and the middle knuckle. Keep the knuckle at the tips of your fingers straight and try to touch the bottom of your palm. Hold this position for __________ seconds. Extend your fingers back to the starting position, stretching every joint fully. Repeat this exercise 5-10 times with each hand. Tabletop  Stand or sit with your arm, hand, and all five fingers pointed straight up. Make sure to keep your wrist straight during the exercise. Gently bend your fingers at the big knuckle, where your fingers meet your hand, as far down as you can while keeping the small knuckles in your fingers straight. Think of forming a tabletop with your  fingers. Hold this position for __________ seconds. Extend your fingers back to the starting position, stretching every joint fully. Repeat this exercise 5-10 times with each  hand. Finger spread  Place your hand flat on a table with your palm facing down. Make sure your wrist stays straight as you do this exercise. Spread your fingers and thumb apart from each other as far as you can until you feel a gentle stretch. Hold this position for __________ seconds. Bring your fingers and thumb tight together again. Hold this position for __________ seconds. Repeat this exercise 5-10 times with each hand. Making circles  Stand or sit with your arm, hand, and all five fingers pointed straight up. Make sure to keep your wrist straight during the exercise. Make a circle by touching the tip of your thumb to the tip of your index finger. Hold for __________ seconds. Then open your hand wide. Repeat this motion with your thumb and each finger on your hand. Repeat this exercise 5-10 times with each hand. Thumb motion  Sit with your forearm resting on a table and your wrist straight. Your thumb should be facing up toward the ceiling. Keep your fingers relaxed as you move your thumb. Lift your thumb up as high as you can toward the ceiling. Hold for __________ seconds. Bend your thumb across your palm as far as you can, reaching the tip of your thumb for the small finger (pinkie) side of your palm. Hold for __________ seconds. Repeat this exercise 5-10 times with each hand. Grip strengthening  Hold a stress ball or other soft ball in the middle of your hand. Slowly increase the pressure, squeezing the ball as much as you can without causing pain. Think of bringing the tips of your fingers into the middle of your palm. All of your finger joints should bend when doing this exercise. Hold your squeeze for __________ seconds, then relax. Repeat this exercise 5-10 times with each hand. Contact a health care provider if: Your hand pain or discomfort gets much worse when you do an exercise. Your hand pain or discomfort does not improve within 2 hours after you exercise. If you have  any of these problems, stop doing these exercises right away. Do not do them again unless your health care provider says that you can. Get help right away if: You develop sudden, severe hand pain or swelling. If this happens, stop doing these exercises right away. Do not do them again unless your health care provider says that you can. This information is not intended to replace advice given to you by your health care provider. Make sure you discuss any questions you have with your health care provider. Document Revised: 08/07/2020 Document Reviewed: 08/07/2020 Elsevier Patient Education  [place].

## 2021-02-23 ENCOUNTER — Ambulatory Visit: Payer: 59 | Admitting: Orthopedic Surgery

## 2021-03-03 ENCOUNTER — Ambulatory Visit: Payer: 59 | Admitting: Physical Therapy

## 2021-04-07 ENCOUNTER — Telehealth: Payer: Self-pay | Admitting: Neurology

## 2021-04-07 NOTE — Telephone Encounter (Signed)
Unable to LVM (VM box full), mychart msg sent informing pt of r/s needed for 12/19 appt- MD out.

## 2021-04-20 ENCOUNTER — Institutional Professional Consult (permissible substitution): Payer: 59 | Admitting: Neurology

## 2021-04-21 ENCOUNTER — Ambulatory Visit: Payer: 59 | Admitting: Dermatology

## 2021-05-20 ENCOUNTER — Ambulatory Visit (INDEPENDENT_AMBULATORY_CARE_PROVIDER_SITE_OTHER): Payer: 59 | Admitting: Internal Medicine

## 2021-05-23 ENCOUNTER — Encounter: Payer: Self-pay | Admitting: Neurology

## 2021-05-24 IMAGING — MR MR HEAD WO/W CM
12 of 13 series · 38 of 48 positions shown · IV contrast (gadavist)
Comparison: CT head February 26, 2010.  MRI March 02, 2010.

CLINICAL DATA: Ataxia, headaches.

EXAM:
MRI HEAD WITHOUT AND WITH CONTRAST
TECHNIQUE: Multiplanar, multiecho pulse sequences of the brain and surrounding
structures were obtained without and with intravenous contrast.
CONTRAST:  8mL GADAVIST GADOBUTROL 1 MMOL/ML IV SOLN

[Series 5: DWI · axial · 3.0mm · 0.77mm/px · z∈[-85,+57]mm · 3 of 50 slices shown (1 of 4)]
[im 1/50]
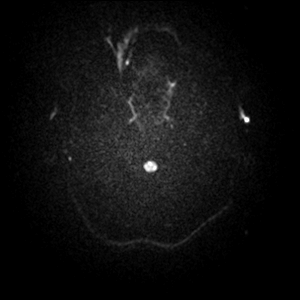
[im 25/50]
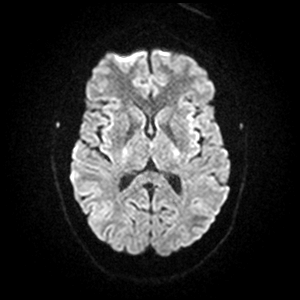
[im 50/50]
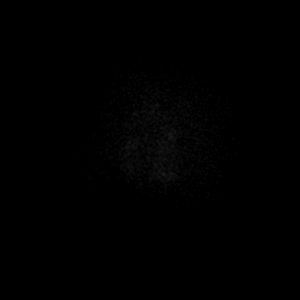

[Series 6: DWI · axial · 3.0mm · 0.77mm/px · z∈[-85,+57]mm · 3 of 50 slices shown (2 of 4)]
[im 1/50]
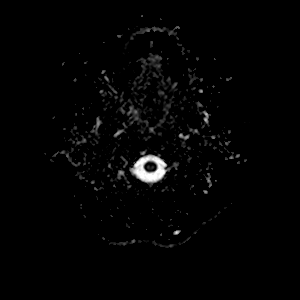
[im 25/50]
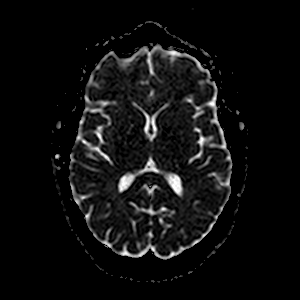
[im 50/50]
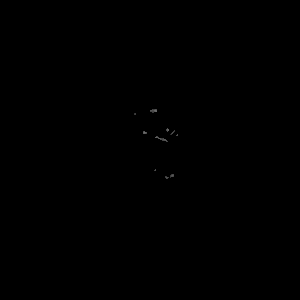

[Series 7: DWI · coronal · 5.0mm · 0.88mm/px · 2 of 28 slices shown (3 of 4)]
[im 1/28]
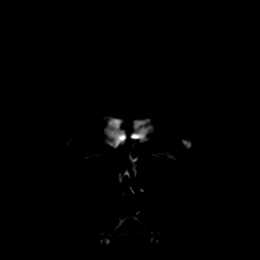
[im 28/28]
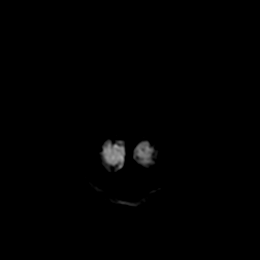

[Series 8: DWI · coronal · 5.0mm · 0.88mm/px · 2 of 28 slices shown (4 of 4)]
[im 1/28]
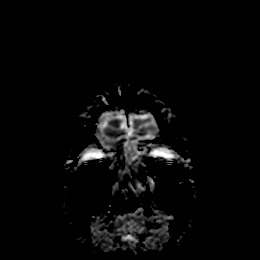
[im 28/28]
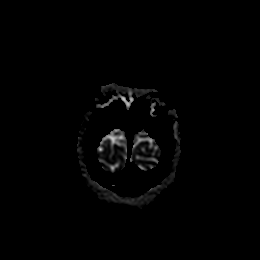

[Series 9: T1 · sagittal · 5.0mm · 0.75mm/px · 1 of 21 slices shown (1 of 2)]
[im 1/21]
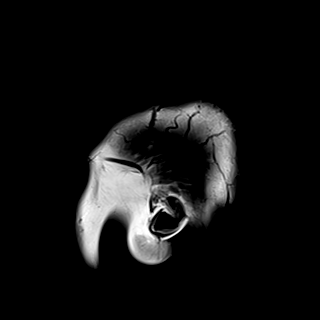

[Series 10: T2 · axial · 5.0mm · 0.72mm/px · 1 of 23 slices shown (1 of 2)]
[im 1/23]
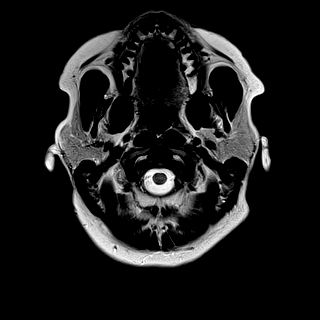

[Series 11: mag_images · axial · 3.0mm · 0.90mm/px · z∈[-97,+74]mm · 4 of 60 slices shown]
[im 1/60]
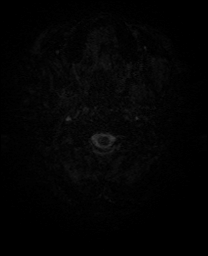
[im 20/60]
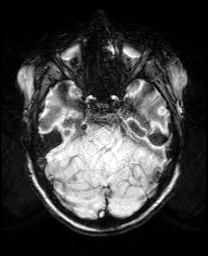
[im 40/60]
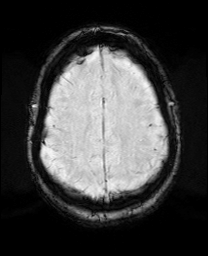
[im 60/60]
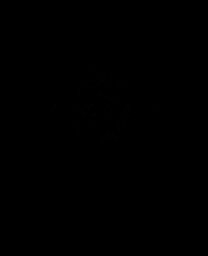

[Series 12: pha_images · axial · 3.0mm · 0.90mm/px · z∈[-97,+68]mm · 3 of 58 slices shown]
[im 1/58]
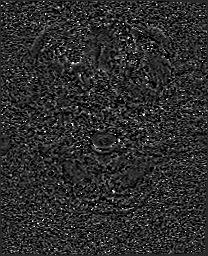
[im 29/58]
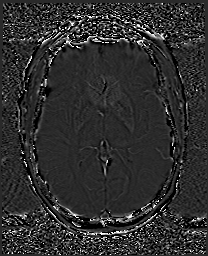
[im 58/58]
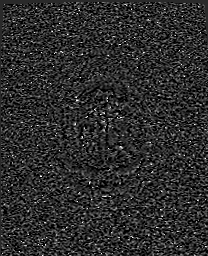

[Series 13: swi_images · axial · 3.0mm · 0.90mm/px · z∈[-97,+74]mm · 4 of 60 slices shown]
[im 1/60]
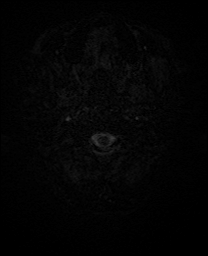
[im 20/60]
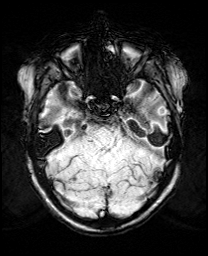
[im 40/60]
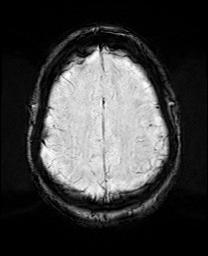
[im 60/60]
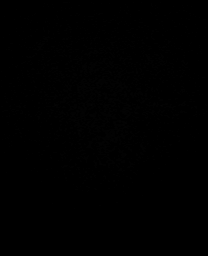

[Series 15: FLAIR · axial · 3.0mm · 0.45mm/px · z∈[-83,+59]mm · 3 of 50 slices shown]
[im 1/50]
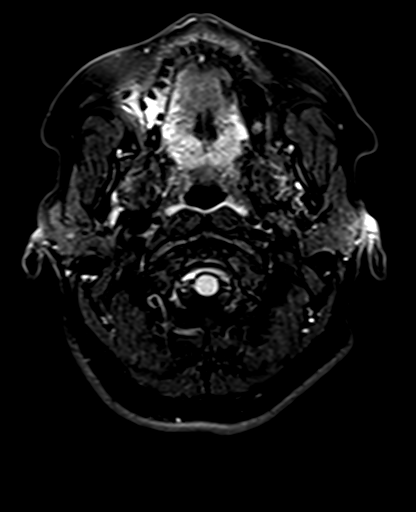
[im 25/50]
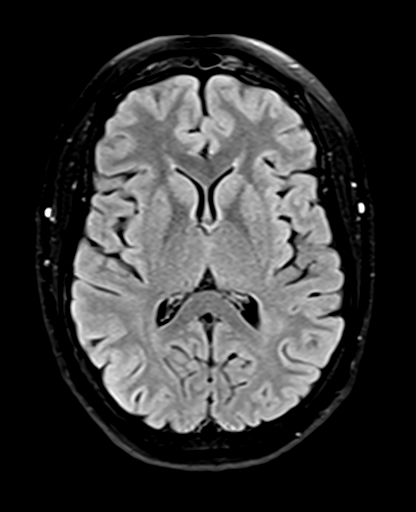
[im 50/50]
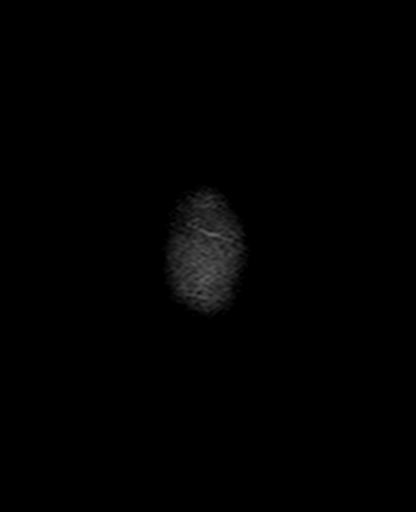

[Series 16: T1 · axial · 1.0mm · 0.98mm/px · z∈[-100,+69]mm · 10 of 171 slices shown (2 of 2)]
[im 1/171]
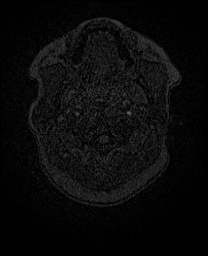
[im 19/171]
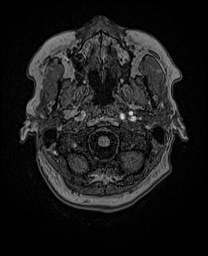
[im 38/171]
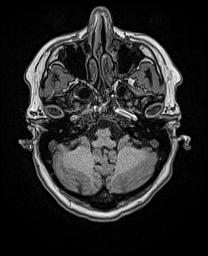
[im 57/171]
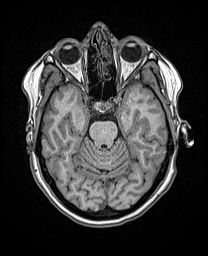
[im 76/171]
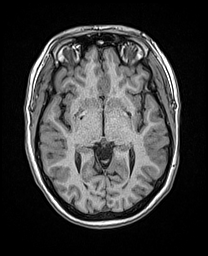
[im 95/171]
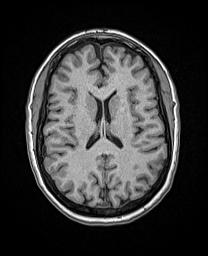
[im 114/171]
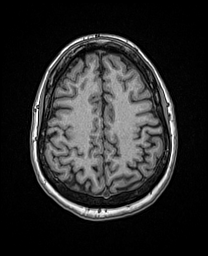
[im 133/171]
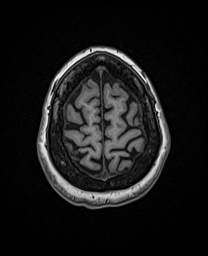
[im 152/171]
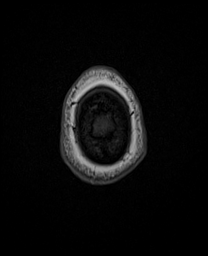
[im 171/171]
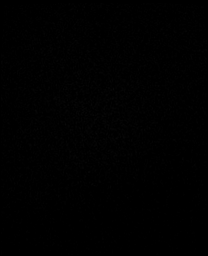

[Series 17: T2 · coronal · 5.0mm · 0.72mm/px · 2 of 28 slices shown (2 of 2)]
[im 1/28]
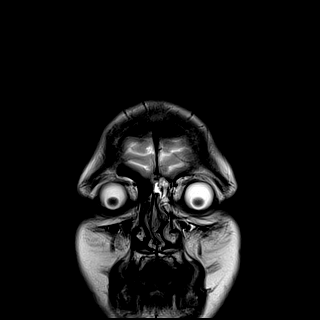
[im 28/28]
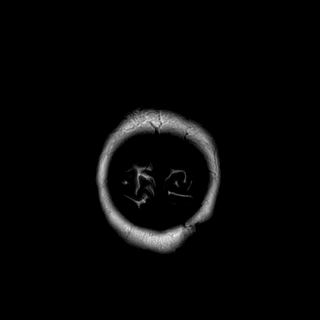

[38 of 48 positions shown; findings below may reference images not displayed]

FINDINGS: Brain: No acute infarction, hemorrhage, hydrocephalus, or
extra-axial collection. Small (no abnormal enhancement. Small T2
hypointense lesion along the left aspect of the quadrigeminal
cistern which demonstrates susceptibility artifact and was calcified
on prior CT head from 6888. This lesion measures up to approximately
7 mm on the T2 sequence (series 10, image 11), likely slightly
increased in size from prior (previously 4 mm). No mass effect or
edema.

Vascular: Major arterial flow voids are maintained at the skull
base. Small vertebrobasilar system with bilateral fetal type PCAs,
anatomic variant.

Skull and upper cervical spine: Normal marrow signal.

Sinuses/Orbits: Mucosal thickening of the left frontal sinus, left
ethmoid air cells, and left maxillary sinus.

Other: Small bilateral mastoid effusions.
IMPRESSION: 1. No evidence of acute intracranial abnormality.
2. In comparison to remote prior from 6888, suspected minimal
increase in size of an extra-axial 7 mm calcified lesion along the
left aspect of the quadrigeminal cistern, most likely a small
meningioma. No mass effect or edema.
3. Left ostiomeatal unit pattern sinus mucosal thickening. Correlate
with signs/symptoms of sinusitis.
4. Small bilateral mastoid effusions.

## 2021-05-26 ENCOUNTER — Encounter: Payer: Self-pay | Admitting: Gastroenterology

## 2021-06-11 ENCOUNTER — Ambulatory Visit: Payer: Self-pay | Admitting: Physician Assistant

## 2021-07-15 ENCOUNTER — Ambulatory Visit: Payer: 59 | Admitting: Dermatology

## 2021-08-05 ENCOUNTER — Other Ambulatory Visit (HOSPITAL_BASED_OUTPATIENT_CLINIC_OR_DEPARTMENT_OTHER): Payer: Self-pay | Admitting: Orthopaedic Surgery

## 2021-08-05 ENCOUNTER — Ambulatory Visit (HOSPITAL_BASED_OUTPATIENT_CLINIC_OR_DEPARTMENT_OTHER)
Admission: RE | Admit: 2021-08-05 | Discharge: 2021-08-05 | Disposition: A | Discharge status: Home / Self Care | Payer: 59 | Source: Ambulatory Visit | Attending: Orthopaedic Surgery | Admitting: Orthopaedic Surgery

## 2021-08-05 ENCOUNTER — Ambulatory Visit (INDEPENDENT_AMBULATORY_CARE_PROVIDER_SITE_OTHER): Payer: 59 | Admitting: Orthopaedic Surgery

## 2021-08-05 DIAGNOSIS — M2392 Unspecified internal derangement of left knee: Secondary | ICD-10-CM

## 2021-08-05 DIAGNOSIS — M25562 Pain in left knee: Secondary | ICD-10-CM | POA: Diagnosis present

## 2021-08-05 DIAGNOSIS — G8929 Other chronic pain: Secondary | ICD-10-CM | POA: Diagnosis present

## 2021-08-05 NOTE — Progress Notes (Signed)
? ?                            ? ? ?Chief Complaint: Left knee pain ?  ? ? ?History of Present Illness:  ? ? ?Lisa Ryan is a 46 y.o. female presents with ongoing left knee pain that has been worse over the course of the last several months.  She does have a history of 2 previous what sounds like left meniscal debridements initially performed by Dr. Percell Miller Senior in 1993 and then subsequently again by Dr. Percell Miller Junior in 2012.  She states that since that time her left knee has never felt the same.  She has progressively worsening pain with most activities and even with weightbearing.  She states that she experiences the pain both medially and laterally about the IT band.  She has previously undergone physical therapy now for several months for strengthening of the left knee with minimal relief.  She is taking 500 mg of naproxen with limited relief.  She did have an injection in this left knee which was done by Dr. Onnie Graham 1 year prior with only approximately 1 week of relief.  She has been icing the left knee.  She states that this is stiff in the morning and feels painful by the day.  She feels like the knee is giving out as well.  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  She is hoping to stay active as her daughter is very interested in equestrian although she has not been able to do this as a result of her knee pain ? ? ? ?Surgical History:   ?Left knee arthroscopy with meniscal debridement in 1993 as well as 2012 ? ?PMH/PSH/Family History/Social History/Meds/Allergies:   ? ?Past Medical History:  ?Diagnosis Date  ? Anemia   ? Anginal pain ([place])   ? due to asthma meds  ? Anxiety state, unspecified 10/12/2007  ? Arthritis   ? Asthma   ? CHEST PAIN, ATYPICAL 02/18/2010  ? after using albuterol  ? CHEST PAIN-PRECORDIAL 02/19/2010  ? after albuterol  ? CHF (congestive heart failure) ([place]) 1999  ? During Pregnancy, After C section  ? COPD (chronic obstructive pulmonary disease) ([place])   ? Diverticulosis   ? FATIGUE  09/28/2007  ? Fatty liver   ? GASTROENTERITIS 10/03/2008  ? GERD (gastroesophageal reflux disease)   ? occ takes OTC ttums  ? History of vitamin D deficiency   ? Hypothyroidism   ? MIGRAINE HEADACHE 03/04/2010  ? Obesity   ? PEDAL EDEMA 02/18/2010  ? comes and goes   ? PEPTIC ULCER DISEASE 09/28/2007  ? 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 PONV (postoperative nausea and vomiting)   ? SEIZURE DISORDER 09/28/2007  ? SUI (stress urinary incontinence, female)   ? Thyroid nodule   ? Tobacco abuse   ? WEIGHT GAIN 09/28/2007  ? ?Past Surgical History:  ?Procedure Laterality Date  ? ANKLE SURGERY    ? left  ? APPENDECTOMY    ? CARDIAC CATHETERIZATION  2015  ? CESAREAN SECTION    ? x3  ? CHOLECYSTECTOMY    ? DILITATION & CURRETTAGE/HYSTROSCOPY WITH NOVASURE ABLATION N/A 12/26/2015  ? Procedure: DILATATION & CURETTAGE/HYSTEROSCOPY WITH ENDOMETRIAL ABLATION;  Surgeon: Jonnie Kind, MD;  Location: [facility];  Service: Gynecology;  Laterality: N/A;  ? ESOPHAGOGASTRODUODENOSCOPY  2012  ? KNEE ARTHROSCOPY Left   ? x2  ? MOUTH LESION EXCISIONAL BIOPSY  04/2011  ? pre-cancerous,  pallet of mouth  ? SHOULDER ARTHROSCOPY WITH ROTATOR CUFF REPAIR Right 08/16/2019  ? Procedure: Right shoulder arthroscopy, subacromial decompression, distal clavicle resection, rotator cuff repair;  Surgeon: Justice Britain, MD;  Location: [facility];  Service: Orthopedics;  Laterality: Right;  148mn  ? SHOULDER ARTHROSCOPY WITH ROTATOR CUFF REPAIR Right 10/02/2019  ? Procedure: SHOULDER ARTHROSCOPY WITH ROTATOR CUFF REPAIR;  Surgeon: SJustice Britain MD;  Location: [facility];  Service: Orthopedics;  Laterality: Right;  971m  ? ?Social History  ? ?Socioeconomic History  ? Marital status: Married  ?  Spouse name: Not on file  ? Number of children: 3  ? Years of education: Not on file  ? Highest education level: Not on file  ?Occupational History  ?  Employer: [employer]  ?Tobacco Use  ? Smoking status: Every Day  ?  Packs/day: 1.00  ?  Years:  15.00  ?  Pack years: 15.00  ?  Types: Cigarettes  ? Smokeless tobacco: Never  ?Vaping Use  ? Vaping Use: Never used  ?Substance and Sexual Activity  ? Alcohol use: Yes  ?  Comment: occ  ? Drug use: No  ? Sexual activity: Yes  ?  Birth control/protection: Surgical  ?Other Topics Concern  ? Not on file  ?Social History Narrative  ? Not on file  ? ?Social Determinants of Health  ? ?Financial Resource Strain: Not on file  ?Food Insecurity: Not on file  ?Transportation Needs: Not on file  ?Physical Activity: Not on file  ?Stress: Not on file  ?Social Connections: Not on file  ? ?Family History  ?Problem Relation Age of Onset  ? Cerebral aneurysm Mother   ? Colon cancer Mother   ? Rheum arthritis Mother   ? Heart disease Father   ? Liver disease Father   ? Diabetes Sister   ? Atrial fibrillation Sister   ? Hypertension Sister   ? Healthy Sister   ? Muscular dystrophy Brother   ? Diabetes Brother   ? Colon cancer Maternal Aunt   ? Kidney disease Maternal Grandmother   ? Colon cancer Maternal Grandmother   ? Healthy Son   ? ?Allergies  ?Allergen Reactions  ? Albuterol Anxiety and Other (See Comments)  ?  Increased HR 130s (pt tolerates duoneb)  ? Hydrocodone-Acetaminophen Nausea And Vomiting  ? Morphine Other (See Comments)  ?  hallucinations  ? Adhesive [Tape] Rash  ?  Paper tape is ok  ? ?Current Outpatient Medications  ?Medication Sig Dispense Refill  ? DULoxetine (CYMBALTA) 30 MG capsule TAKE 3 CAPSULES BY MOUTH EVERY DAY AT BEDTIME 90 capsule 2  ? ipratropium-albuterol (DUONEB) 0.5-2.5 (3) MG/3ML SOLN Take 3 mLs by nebulization every 6 (six) hours as needed. 360 mL 0  ? levalbuterol (XOPENEX HFA) 45 MCG/ACT inhaler Inhale 1-2 puffs into the lungs daily as needed for wheezing or shortness of breath.    ? Melatonin 10 MG TABS Take 20 mg by mouth at bedtime.    ? naproxen (NAPROSYN) 500 MG tablet TAKE 1 TABLET(500 MG) BY MOUTH TWICE DAILY WITH A MEAL 60 tablet 1  ? progesterone (PROMETRIUM) 200 MG capsule Take 3 capsules  (600 mg total) by mouth at bedtime. 90 capsule 3  ? varenicline (CHANTIX CONTINUING MONTH PAK) 1 MG tablet Take 1 tablet (1 mg total) by mouth 2 (two) times daily. (Patient not taking: Reported on 02/18/2021) 60 tablet 3  ? varenicline (CHANTIX) 0.5 MG tablet Take 1 tablet (0.5 mg total) by mouth 2 (two) times  daily. 20 tablet 0  ? ?No current facility-administered medications for this visit.  ? ?No results found. ? ?Review of Systems:   ?A ROS was performed including pertinent positives and negatives as documented in the HPI. ? ?Physical Exam :   ?Constitutional: NAD and appears stated age ?Neurological: Alert and oriented ?Psych: Appropriate affect and cooperative ?There were no vitals taken for this visit.  ? ?Comprehensive Musculoskeletal Exam:   ? ?  ?Musculoskeletal Exam  ?Gait Normal  ?Alignment Normal  ? Right Left  ?Inspection Normal Normal  ?Palpation    ?Tenderness None Lateral femoral condyle near IT band, medial joint  ?Crepitus None None  ?Effusion None None  ?Range of Motion    ?Extension 0 0  ?Flexion 135 135  ?Strength    ?Extension 5/5 5/5  ?Flexion 5/5 5/5  ?Ligament Exam     ?Generalized Laxity No No  ?Lachman Negative Negative   ?Pivot Shift Negative Negative  ?Anterior Drawer Negative Negative  ?Valgus at 0 Negative Negative  ?Valgus at 20 Negative Negative  ?Varus at 0 0 0  ?Varus at 20   0 0  ?Posterior Drawer at 90 0 0  ?Vascular/Lymphatic Exam    ?Edema None None  ?Venous Stasis Changes No No  ?Distal Circulation Normal Normal  ?Neurologic    ?Light Touch Sensation Intact Intact  ?Special Tests: Positive McMurray medially  ? ? ? ?Imaging:   ?Xray (4 views left knee): ?Normal ? ? ?I personally reviewed and interpreted the radiographs. ? ? ?Assessment:   ?46 y.o. female female with persistent left knee pain following 2 previous meniscal debridement procedures.  She states that she continues to have pain which is bothering her most aspects of life.  The knee does feel unstable at this point.   This continues to give out.  Given the fact that she has trialed physical therapy as well as a previous injection with minimal relief, I do believe that an MRI is indicated to rule out any type of underlyin

## 2021-08-06 ENCOUNTER — Telehealth (HOSPITAL_BASED_OUTPATIENT_CLINIC_OR_DEPARTMENT_OTHER): Payer: Self-pay

## 2021-08-07 ENCOUNTER — Telehealth (HOSPITAL_BASED_OUTPATIENT_CLINIC_OR_DEPARTMENT_OTHER): Payer: Self-pay

## 2021-08-10 ENCOUNTER — Telehealth (HOSPITAL_BASED_OUTPATIENT_CLINIC_OR_DEPARTMENT_OTHER): Payer: Self-pay

## 2021-08-20 ENCOUNTER — Ambulatory Visit (HOSPITAL_BASED_OUTPATIENT_CLINIC_OR_DEPARTMENT_OTHER): Admission: RE | Admit: 2021-08-20 | Payer: 59 | Source: Ambulatory Visit

## 2021-08-26 ENCOUNTER — Encounter (HOSPITAL_COMMUNITY): Payer: Self-pay

## 2021-08-26 ENCOUNTER — Emergency Department (HOSPITAL_COMMUNITY): Payer: 59

## 2021-08-26 ENCOUNTER — Emergency Department (HOSPITAL_COMMUNITY)
Admission: EM | Admit: 2021-08-26 | Discharge: 2021-08-26 | Disposition: A | Discharge status: Home / Self Care | Payer: 59 | Attending: Emergency Medicine | Admitting: Emergency Medicine

## 2021-08-26 ENCOUNTER — Other Ambulatory Visit: Payer: Self-pay

## 2021-08-26 DIAGNOSIS — I509 Heart failure, unspecified: Secondary | ICD-10-CM | POA: Diagnosis not present

## 2021-08-26 DIAGNOSIS — G8929 Other chronic pain: Secondary | ICD-10-CM | POA: Insufficient documentation

## 2021-08-26 DIAGNOSIS — M549 Dorsalgia, unspecified: Secondary | ICD-10-CM | POA: Insufficient documentation

## 2021-08-26 DIAGNOSIS — R1013 Epigastric pain: Secondary | ICD-10-CM | POA: Diagnosis present

## 2021-08-26 DIAGNOSIS — J449 Chronic obstructive pulmonary disease, unspecified: Secondary | ICD-10-CM | POA: Diagnosis not present

## 2021-08-26 DIAGNOSIS — Z7951 Long term (current) use of inhaled steroids: Secondary | ICD-10-CM | POA: Diagnosis not present

## 2021-08-26 DIAGNOSIS — R110 Nausea: Secondary | ICD-10-CM | POA: Diagnosis not present

## 2021-08-26 LAB — URINALYSIS, ROUTINE W REFLEX MICROSCOPIC
Bilirubin Urine: NEGATIVE
Glucose, UA: NEGATIVE mg/dL
Ketones, ur: NEGATIVE mg/dL
Leukocytes,Ua: NEGATIVE
Nitrite: NEGATIVE
Protein, ur: NEGATIVE mg/dL
Specific Gravity, Urine: 1.011 (ref 1.005–1.030)
pH: 7 (ref 5.0–8.0)

## 2021-08-26 LAB — CBC WITH DIFFERENTIAL/PLATELET
Abs Immature Granulocytes: 0.02 10*3/uL (ref 0.00–0.07)
Basophils Absolute: 0.1 10*3/uL (ref 0.0–0.1)
Basophils Relative: 1 %
Eosinophils Absolute: 0.3 10*3/uL (ref 0.0–0.5)
Eosinophils Relative: 4 %
HCT: 41.3 % (ref 36.0–46.0)
Hemoglobin: 14.4 g/dL (ref 12.0–15.0)
Immature Granulocytes: 0 %
Lymphocytes Relative: 29 %
Lymphs Abs: 2.3 10*3/uL (ref 0.7–4.0)
MCH: 34.4 pg — ABNORMAL HIGH (ref 26.0–34.0)
MCHC: 34.9 g/dL (ref 30.0–36.0)
MCV: 98.8 fL (ref 80.0–100.0)
Monocytes Absolute: 0.5 10*3/uL (ref 0.1–1.0)
Monocytes Relative: 7 %
Neutro Abs: 4.7 10*3/uL (ref 1.7–7.7)
Neutrophils Relative %: 59 %
Platelets: 293 10*3/uL (ref 150–400)
RBC: 4.18 MIL/uL (ref 3.87–5.11)
RDW: 12.6 % (ref 11.5–15.5)
WBC: 7.9 10*3/uL (ref 4.0–10.5)
nRBC: 0 % (ref 0.0–0.2)

## 2021-08-26 LAB — COMPREHENSIVE METABOLIC PANEL
ALT: 20 U/L (ref 0–44)
AST: 14 U/L — ABNORMAL LOW (ref 15–41)
Albumin: 4 g/dL (ref 3.5–5.0)
Alkaline Phosphatase: 74 U/L (ref 38–126)
Anion gap: 7 (ref 5–15)
BUN: 14 mg/dL (ref 6–20)
CO2: 25 mmol/L (ref 22–32)
Calcium: 9.4 mg/dL (ref 8.9–10.3)
Chloride: 107 mmol/L (ref 98–111)
Creatinine, Ser: 0.75 mg/dL (ref 0.44–1.00)
GFR, Estimated: >60 mL/min (ref >60)
Glucose, Bld: 103 mg/dL — ABNORMAL HIGH (ref 70–99)
Potassium: 3.6 mmol/L (ref 3.5–5.1)
Sodium: 139 mmol/L (ref 135–145)
Total Bilirubin: 0.5 mg/dL (ref 0.3–1.2)
Total Protein: 6.9 g/dL (ref 6.5–8.1)

## 2021-08-26 LAB — TROPONIN I (HIGH SENSITIVITY)
Troponin I (High Sensitivity): <2 ng/L (ref <18)
Troponin I (High Sensitivity): <2 ng/L (ref <18)

## 2021-08-26 LAB — LIPASE, BLOOD: Lipase: 33 U/L (ref 11–51)

## 2021-08-26 LAB — HCG, QUANTITATIVE, PREGNANCY: hCG, Beta Chain, Quant, S: <1 m[IU]/mL (ref <5)

## 2021-08-26 MED ORDER — FAMOTIDINE IN NACL 20-0.9 MG/50ML-% IV SOLN
20.0000 mg | Freq: Once | INTRAVENOUS | Status: AC
  Administered 2021-08-26: 20 mg via INTRAVENOUS
  Filled 2021-08-26: qty 50

## 2021-08-26 MED ORDER — IOHEXOL 300 MG/ML  SOLN
100.0000 mL | Freq: Once | INTRAMUSCULAR | Status: AC | PRN
  Administered 2021-08-26: 100 mL via INTRAVENOUS

## 2021-08-26 MED ORDER — SUCRALFATE 1 G PO TABS: 1.0000 g | ORAL_TABLET | Freq: Three times a day (TID) | ORAL | 0 refills | Status: DC

## 2021-08-26 MED ORDER — SUCRALFATE 1 GM/10ML PO SUSP
1.0000 g | Freq: Once | ORAL | Status: AC
  Administered 2021-08-26: 1 g via ORAL
  Filled 2021-08-26: qty 10

## 2021-08-26 MED ORDER — ONDANSETRON HCL 4 MG PO TABS: 4.0000 mg | ORAL_TABLET | Freq: Four times a day (QID) | ORAL | 0 refills | Status: DC

## 2021-08-26 MED ORDER — OXYCODONE-ACETAMINOPHEN 5-325 MG PO TABS: 1.0000 | ORAL_TABLET | Freq: Four times a day (QID) | ORAL | 0 refills | Status: DC | PRN

## 2021-08-26 MED ORDER — ONDANSETRON HCL 4 MG/2ML IJ SOLN
4.0000 mg | Freq: Once | INTRAMUSCULAR | Status: AC
  Administered 2021-08-26: 4 mg via INTRAVENOUS
  Filled 2021-08-26: qty 2

## 2021-08-26 NOTE — ED Triage Notes (Signed)
Pt reports upper gastric pain that started today.  Reports this started this morning.  Reports this has been a continuous problem for over a month be she has been unable to see gastroenterologist yet.  Resp even and unlabored.  Skinw arm and dry.  nad  ?

## 2021-08-26 NOTE — Discharge Instructions (Signed)
Continue to take medicine as prescribed as written.  Follow-up closely with [facility] for further evaluation and care.  Please do not hesitate to return to the emergency department if you notice any of the worrisome signs and symptoms we discussed. ?

## 2021-08-26 NOTE — ED Provider Notes (Signed)
?[facility] ?Provider Note ? ? ?CSN: [id number] ?Arrival date & time: 08/26/21  1534 ? ?  ? ?History ? ?Chief Complaint  ?Patient presents with  ? Abdominal Pain  ? ? ?Mariel Dlugosz is a 46 y.o. female. ? ? ?Abdominal Pain ?Associated symptoms: nausea   ?Associated symptoms: no chest pain, no chills, no constipation, no cough, no diarrhea, no dysuria, no fatigue, no fever, no shortness of breath, no vaginal bleeding, no vaginal discharge and no vomiting   ? ?46 year old female presents emergency department with epigastric abdominal pain since 3 AM this morning.  Patient states that the pain has been intermittent over the past month, but worsening pain occurred at said time and has not stopped since.  Pain is described as sharp and stabbing, radiating to central back.  Patient has chronic back pain and she states that it is hard to tell the difference between her chronic back pain and referred pain from abdomen.  Since the onset of the symptoms a month ago, she visited her PCP who has done lab work to rule out H. pylori infection; she was negative for the work-up.  She says she has an appointment for [facility] for further investigation of symptoms, but has had a difficult time trying to move her appointment up.  Her symptoms are worse with food and liquids; they are relieved with rest.  Her symptoms are not associated with physical exacerbation or shortness of breath.  She does confirm nausea.  When asked about the appearance of her stools, she said that she has not been paying attention.  She is chronically taking naproxen 500 mg twice daily for the past 2 years.  She denies vomiting/change in bowel/bladder habits, fever, chills, night sweats, chest pain, shortness of breath. ? ?She has a past medical history significant for atypical chest pain, peptic ulcer disease, gastritis, CHF, COPD, fatty liver, diverticulosis, ? ?Past surgical history significant for cholecystectomy and appendectomy.  Her  last EGD was 2012, and she also had a cardiac catheterization in 2015. ? ?Home Medications ?Prior to Admission medications   ?Medication Sig Start Date End Date Taking? Authorizing Provider  ?DULoxetine (CYMBALTA) 30 MG capsule TAKE 3 CAPSULES BY MOUTH EVERY DAY AT BEDTIME ?Patient taking differently: 60 mg at bedtime. 12/08/20  Yes Ailene Ards, NP  ?levalbuterol [facility] HFA) 45 MCG/ACT inhaler Inhale 1-2 puffs into the lungs daily as needed for wheezing or shortness of breath.   Yes [provider]  ?Melatonin 10 MG TABS Take 20 mg by mouth at bedtime.   Yes [provider]  ?naproxen (NAPROSYN) 500 MG tablet TAKE 1 TABLET(500 MG) BY MOUTH TWICE DAILY WITH A MEAL 10/28/20  Yes Gosrani, Nimish C, MD  ?progesterone (PROMETRIUM) 200 MG capsule Take 3 capsules (600 mg total) by mouth at bedtime. 10/13/20  Yes Gosrani, Nimish C, MD  ?ipratropium-albuterol (DUONEB) 0.5-2.5 (3) MG/3ML SOLN Take 3 mLs by nebulization every 6 (six) hours as needed. ?Patient not taking: Reported on 08/26/2021 04/17/18   Domenic Polite, MD  ?varenicline (CHANTIX CONTINUING MONTH PAK) 1 MG tablet Take 1 tablet (1 mg total) by mouth 2 (two) times daily. ?Patient not taking: Reported on 02/18/2021 11/17/20   Doree Albee, MD  ?varenicline (CHANTIX) 0.5 MG tablet Take 1 tablet (0.5 mg total) by mouth 2 (two) times daily. 11/17/20   Doree Albee, MD  ?   ? ?Allergies    ?Albuterol, Hydrocodone-acetaminophen, Morphine, and Adhesive [tape]   ? ?Review of Systems   ?  Review of Systems  ?Constitutional:  Positive for appetite change. Negative for chills, fatigue and fever.  ?     Does not want to eat because eating makes her symptoms worse.  ?HENT:  Negative for congestion and rhinorrhea.   ?Respiratory:  Negative for cough, choking, chest tightness, shortness of breath, wheezing and stridor.   ?Cardiovascular:  Negative for chest pain, palpitations and leg swelling.  ?Gastrointestinal:  Positive for abdominal pain and nausea.  Negative for constipation, diarrhea and vomiting.  ?Genitourinary:  Negative for decreased urine volume, dysuria, frequency, urgency, vaginal bleeding and vaginal discharge.  ?Neurological:  Negative for dizziness, seizures, syncope, weakness, numbness and headaches.  ? ?Physical Exam ?Updated Vital Signs ?BP 120/75   Pulse 83   Temp 97.7 ?F (36.5 ?C) (Oral)   Resp 13   Ht '5\' 7"'$  (1.702 m)   Wt 81.6 kg   SpO2 98%   BMI 28.19 kg/m?  ?Physical Exam ?Vitals and nursing note reviewed.  ?Constitutional:   ?   General: She is not in acute distress. ?   Appearance: She is well-developed. She is not ill-appearing, toxic-appearing or diaphoretic.  ?HENT:  ?   Head: Normocephalic.  ?Cardiovascular:  ?   Rate and Rhythm: Normal rate and regular rhythm.  ?   Heart sounds: No murmur heard. ?   Comments: Occasional extra heart sound heard ?Pulmonary:  ?   Effort: Pulmonary effort is normal.  ?   Breath sounds: Normal breath sounds. No wheezing or rales.  ?Abdominal:  ?   General: Abdomen is flat. Bowel sounds are normal. There is no distension. There are no signs of injury.  ?   Palpations: Abdomen is soft. There is no mass.  ?   Tenderness: There is abdominal tenderness in the epigastric area. There is no guarding or rebound. Negative signs include Murphy's sign and McBurney's sign.  ?   Hernia: No hernia is present.  ? ? ?   Comments: No overlying skin abnormality including Cullen or Veleta Miners Turner sign noted.  No peritoneal signs noted upon bumping the bed.  Tenderness noted upon palpation of the epigastric region and slightly laterally left pictured above.  ?Skin: ?   General: Skin is warm and dry.  ?   Capillary Refill: Capillary refill takes less than 2 seconds.  ?Neurological:  ?   General: No focal deficit present.  ?   Mental Status: She is alert and oriented to person, place, and time.  ?Psychiatric:     ?   Mood and Affect: Mood normal.     ?   Behavior: Behavior normal.  ? ? ?ED Results / Procedures / Treatments    ?Labs ?(all labs ordered are listed, but only abnormal results are displayed) ?Labs Reviewed  ?CBC WITH DIFFERENTIAL/PLATELET - Abnormal; Notable for the following components:  ?    Result Value  ? MCH 34.4 (*)   ? All other components within normal limits  ?COMPREHENSIVE METABOLIC PANEL - Abnormal; Notable for the following components:  ? Glucose, Bld 103 (*)   ? AST 14 (*)   ? All other components within normal limits  ?URINALYSIS, ROUTINE W REFLEX MICROSCOPIC - Abnormal; Notable for the following components:  ? APPearance HAZY (*)   ? Hgb urine dipstick SMALL (*)   ? Bacteria, UA MANY (*)   ? All other components within normal limits  ?LIPASE, BLOOD  ?HCG, QUANTITATIVE, PREGNANCY  ?TROPONIN I (HIGH SENSITIVITY)  ?TROPONIN I (HIGH SENSITIVITY)  ? ? ?EKG ?  None ? ?Radiology ?DG Abdomen 1 View ? ?Result Date: 08/26/2021 ?CLINICAL DATA:  Upper abdominal pain EXAM: ABDOMEN - 1 VIEW COMPARISON:  None. FINDINGS: Pelvis excluded from view. Visualized abdominal gas pattern is normal. No free intraperitoneal gas. Visualized lung bases are clear. Cholecystectomy clips seen in the right upper quadrant. IMPRESSION: Normal abdominal gas pattern.  No free air. Electronically Signed   By: Fidela Salisbury M.D.   On: 08/26/2021 17:43  ? ?CT Abdomen Pelvis W Contrast ? ?Result Date: 08/26/2021 ?CLINICAL DATA:  Left side abdominal pain EXAM: CT ABDOMEN AND PELVIS WITH CONTRAST TECHNIQUE: Multidetector CT imaging of the abdomen and pelvis was performed using the standard protocol following bolus administration of intravenous contrast. RADIATION DOSE REDUCTION: This exam was performed according to the departmental dose-optimization program which includes automated exposure control, adjustment of the mA and/or kV according to patient size and/or use of iterative reconstruction technique. CONTRAST:  127m OMNIPAQUE IOHEXOL 300 MG/ML  SOLN COMPARISON:  07/03/2018 FINDINGS: Lower chest: Elevation of the left hemidiaphragm. No acute  abnormality. Hepatobiliary: Scattered hypodensities in the liver compatible with cysts. Prior cholecystectomy. Pancreas: No focal abnormality or ductal dilatation. Spleen: No focal abnormality.  Normal size. Adrenals/

## 2021-08-27 ENCOUNTER — Encounter: Payer: Self-pay | Admitting: Gastroenterology

## 2021-08-28 ENCOUNTER — Ambulatory Visit (HOSPITAL_BASED_OUTPATIENT_CLINIC_OR_DEPARTMENT_OTHER)
Admission: RE | Admit: 2021-08-28 | Discharge: 2021-08-28 | Disposition: A | Discharge status: Home / Self Care | Payer: 59 | Source: Ambulatory Visit | Attending: Orthopaedic Surgery | Admitting: Orthopaedic Surgery

## 2021-08-28 DIAGNOSIS — Z9889 Other specified postprocedural states: Secondary | ICD-10-CM | POA: Diagnosis not present

## 2021-08-28 DIAGNOSIS — G8929 Other chronic pain: Secondary | ICD-10-CM | POA: Insufficient documentation

## 2021-08-28 DIAGNOSIS — R609 Edema, unspecified: Secondary | ICD-10-CM | POA: Diagnosis not present

## 2021-08-28 DIAGNOSIS — M7122 Synovial cyst of popliteal space [Baker], left knee: Secondary | ICD-10-CM | POA: Diagnosis not present

## 2021-08-28 DIAGNOSIS — M25562 Pain in left knee: Secondary | ICD-10-CM | POA: Insufficient documentation

## 2021-08-28 DIAGNOSIS — M1712 Unilateral primary osteoarthritis, left knee: Secondary | ICD-10-CM | POA: Insufficient documentation

## 2021-09-01 ENCOUNTER — Other Ambulatory Visit (HOSPITAL_BASED_OUTPATIENT_CLINIC_OR_DEPARTMENT_OTHER): Payer: Self-pay | Admitting: Orthopaedic Surgery

## 2021-09-01 ENCOUNTER — Ambulatory Visit: Payer: 59 | Admitting: Gastroenterology

## 2021-09-01 ENCOUNTER — Encounter: Payer: Self-pay | Admitting: Gastroenterology

## 2021-09-01 VITALS — BP 92/60 | HR 104 | Ht 67.0 in | Wt 181.2 lb

## 2021-09-01 DIAGNOSIS — R1013 Epigastric pain: Secondary | ICD-10-CM

## 2021-09-01 DIAGNOSIS — Z791 Long term (current) use of non-steroidal anti-inflammatories (NSAID): Secondary | ICD-10-CM | POA: Diagnosis not present

## 2021-09-01 DIAGNOSIS — R1012 Left upper quadrant pain: Secondary | ICD-10-CM | POA: Insufficient documentation

## 2021-09-01 DIAGNOSIS — K219 Gastro-esophageal reflux disease without esophagitis: Secondary | ICD-10-CM | POA: Insufficient documentation

## 2021-09-01 MED ORDER — PANTOPRAZOLE SODIUM 40 MG PO TBEC: 40.0000 mg | DELAYED_RELEASE_TABLET | Freq: Two times a day (BID) | ORAL | 3 refills | Status: DC

## 2021-09-01 MED ORDER — SUCRALFATE 1 G PO TABS: 1.0000 g | ORAL_TABLET | Freq: Three times a day (TID) | ORAL | 3 refills | Status: DC

## 2021-09-01 NOTE — Patient Instructions (Signed)
You have been scheduled for an endoscopy. Please follow written instructions given to you at your visit today. ?If you use inhalers (even only as needed), please bring them with you on the day of your procedure.  ? ?We have sent the following medications to your pharmacy for you to pick up at your convenience:  Pantoprazole twice a day and Carafate refills ? ?Due to recent changes in healthcare laws, you may see the results of your imaging and laboratory studies on MyChart before your provider has had a chance to review them.  We understand that in some cases there may be results that are confusing or concerning to you. Not all laboratory results come back in the same time frame and the provider may be waiting for multiple results in order to interpret others.  Please give [place] 48 hours in order for your provider to thoroughly review all the results before contacting the office for clarification of your results.   ? ?If you are age 30 or older, your body mass index should be between 23-30. Your Body mass index is 28.38 kg/m?Marland Kitchen If this is out of the aforementioned range listed, please consider follow up with your Primary Care Provider. ? ?If you are age 57 or younger, your body mass index should be between 19-25. Your Body mass index is 28.38 kg/m?Marland Kitchen If this is out of the aformentioned range listed, please consider follow up with your Primary Care Provider.  ? ?________________________________________________________ ? ?The [facility] providers would like to encourage you to use [facility] to communicate with providers for non-urgent requests or questions.  Due to long hold times on the telephone, sending your provider a message by [facility] may be a faster and more efficient way to get a response.  Please allow 48 business hours for a response.  Please remember that this is for non-urgent requests.  ?_______________________________________________________  ? ?I appreciate the  opportunity to care for you ? ?Thank You   ? ?Jessica Zehr,PA-C ?

## 2021-09-01 NOTE — Progress Notes (Signed)
? ? ? ?09/01/2021 ?Lisa Ryan ?[id number] ?10-Jul-1975 ? ? ?HISTORY OF PRESENT ILLNESS: This is a 46 year old female who is remotely a patient of Dr. Nichola Sizer, not seen here since about 2015.  Last EGD was 2012 and last colonoscopy was well before that.  She does have family history of colon cancer in her mother who was diagnosed at age 40.  The patient is here today with complaints of epigastric and left upper quadrant abdominal pain.  She says that this started back in January.  At that point she was placed on pantoprazole once daily.  She has been on naproxen 500 mg twice daily for several years.  She has recently decreased it to once daily, but takes it for her generalized arthritic pains.  She had a CT scan of the abdomen and pelvis with contrast on 08/26/2021 that was unremarkable.  Placed on Carafate 4 times a day, which she has been doing since then.  Typically does not deal with any heartburn or reflux.  She says her PCP tested her for H. pylori and it was negative. ? ?Moves her bowels regularly.  No rectal bleeding. ? ?Past Medical History:  ?Diagnosis Date  ? Anemia   ? Anginal pain ([place])   ? due to asthma meds  ? Anxiety state, unspecified 10/12/2007  ? Arthritis   ? Asthma   ? CHEST PAIN, ATYPICAL 02/18/2010  ? after using albuterol  ? CHEST PAIN-PRECORDIAL 02/19/2010  ? after albuterol  ? CHF (congestive heart failure) ([place]) 1999  ? During Pregnancy, After C section  ? COPD (chronic obstructive pulmonary disease) ([place])   ? Diverticulosis   ? FATIGUE 09/28/2007  ? Fatty liver   ? GASTROENTERITIS 10/03/2008  ? GERD (gastroesophageal reflux disease)   ? occ takes OTC ttums  ? History of vitamin D deficiency   ? Hypothyroidism   ? MIGRAINE HEADACHE 03/04/2010  ? Obesity   ? PEDAL EDEMA 02/18/2010  ? comes and goes   ? PEPTIC ULCER DISEASE 09/28/2007  ? 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 PONV (postoperative nausea and vomiting)   ? SEIZURE DISORDER 09/28/2007  ? SUI (stress urinary  incontinence, female)   ? Thyroid nodule   ? Tobacco abuse   ? WEIGHT GAIN 09/28/2007  ? ?Past Surgical History:  ?Procedure Laterality Date  ? ANKLE SURGERY    ? left  ? APPENDECTOMY    ? CARDIAC CATHETERIZATION  2015  ? CESAREAN SECTION    ? x3  ? CHOLECYSTECTOMY    ? DILITATION & CURRETTAGE/HYSTROSCOPY WITH NOVASURE ABLATION N/A 12/26/2015  ? Procedure: DILATATION & CURETTAGE/HYSTEROSCOPY WITH ENDOMETRIAL ABLATION;  Surgeon: Jonnie Kind, MD;  Location: [facility];  Service: Gynecology;  Laterality: N/A;  ? ESOPHAGOGASTRODUODENOSCOPY  2012  ? KNEE ARTHROSCOPY Left   ? x2  ? MOUTH LESION EXCISIONAL BIOPSY  04/2011  ? pre-cancerous, pallet of mouth  ? SHOULDER ARTHROSCOPY WITH ROTATOR CUFF REPAIR Right 08/16/2019  ? Procedure: Right shoulder arthroscopy, subacromial decompression, distal clavicle resection, rotator cuff repair;  Surgeon: Justice Britain, MD;  Location: [facility];  Service: Orthopedics;  Laterality: Right;  150mn  ? SHOULDER ARTHROSCOPY WITH ROTATOR CUFF REPAIR Right 10/02/2019  ? Procedure: SHOULDER ARTHROSCOPY WITH ROTATOR CUFF REPAIR;  Surgeon: SJustice Britain MD;  Location: [facility];  Service: Orthopedics;  Laterality: Right;  921m  ? ? reports that she has been smoking cigarettes. She has a 15.00 pack-year smoking history. She has never used smokeless tobacco. She reports current alcohol use. She  reports that she does not use drugs. ?family history includes Atrial fibrillation in her sister; Cerebral aneurysm in her mother; Colon cancer in her maternal aunt and maternal grandmother; Colon cancer (age of onset: 11) in her mother; Diabetes in her brother and sister; Healthy in her sister and son; Heart disease in her father; Hypertension in her sister; Kidney disease in her maternal grandmother; Liver disease in her father; Muscular dystrophy in her brother; Rheum arthritis in her mother. ?Allergies  ?Allergen Reactions  ? Albuterol Anxiety and Other (See Comments)  ?  Increased HR 130s (pt tolerates duoneb)   ? Hydrocodone-Acetaminophen Nausea And Vomiting  ? Morphine Other (See Comments)  ?  hallucinations  ? Adhesive [Tape] Rash  ?  Paper tape is ok  ? ? ?  ?Outpatient Encounter Medications as of 09/01/2021  ?Medication Sig  ? DULoxetine (CYMBALTA) 30 MG capsule TAKE 3 CAPSULES BY MOUTH EVERY DAY AT BEDTIME (Patient taking differently: 60 mg at bedtime.)  ? ipratropium-albuterol (DUONEB) 0.5-2.5 (3) MG/3ML SOLN Take 3 mLs by nebulization every 6 (six) hours as needed.  ? levalbuterol (XOPENEX HFA) 45 MCG/ACT inhaler Inhale 1-2 puffs into the lungs daily as needed for wheezing or shortness of breath.  ? Melatonin 10 MG TABS Take 20 mg by mouth at bedtime.  ? naproxen (NAPROSYN) 500 MG tablet TAKE 1 TABLET(500 MG) BY MOUTH TWICE DAILY WITH A MEAL  ? ondansetron (ZOFRAN) 4 MG tablet Take 1 tablet (4 mg total) by mouth every 6 (six) hours. (Patient taking differently: Take 4 mg by mouth every 8 (eight) hours as needed.)  ? oxyCODONE-acetaminophen (PERCOCET/ROXICET) 5-325 MG tablet Take 1 tablet by mouth every 6 (six) hours as needed for severe pain.  ? pantoprazole (PROTONIX) 40 MG tablet Take 40 mg by mouth daily.  ? progesterone (PROMETRIUM) 200 MG capsule Take 3 capsules (600 mg total) by mouth at bedtime.  ? sucralfate (CARAFATE) 1 g tablet Take 1 tablet (1 g total) by mouth 4 (four) times daily -  with meals and at bedtime.  ? [DISCONTINUED] varenicline (CHANTIX CONTINUING MONTH PAK) 1 MG tablet Take 1 tablet (1 mg total) by mouth 2 (two) times daily. (Patient not taking: Reported on 02/18/2021)  ? [DISCONTINUED] varenicline (CHANTIX) 0.5 MG tablet Take 1 tablet (0.5 mg total) by mouth 2 (two) times daily.  ? ?No facility-administered encounter medications on file as of 09/01/2021.  ? ? ? ?REVIEW OF SYSTEMS  : All other systems reviewed and negative except where noted in the History of Present Illness. ? ? ?PHYSICAL EXAM: ?BP 92/60   Pulse (!) 104   Ht '5\' 7"'$  (1.702 m)   Wt 181 lb 3.2 oz (82.2 kg)   SpO2 98%   BMI  28.38 kg/m?  ?General: Well developed white female in no acute distress ?Head: Normocephalic and atraumatic ?Eyes:  Sclerae anicteric, conjunctiva pink. ?Ears: Normal auditory acuity ?Lungs: Clear throughout to auscultation; no W/R/R. ?Heart: Regular rate and rhythm; no M/R/G. ?Abdomen: Soft, non-distended.  BS present.  Non-tender. ?Musculoskeletal: Symmetrical with no gross deformities  ?Skin: No lesions on visible extremities ?Extremities: No edema  ?Neurological: Alert oriented x 4, grossly non-focal ?Psychological:  Alert and cooperative. Normal mood and affect ? ?ASSESSMENT AND PLAN: ?*Epigastric/left upper quadrant abdominal pain in patient with long-term NSAID use: Has remote history of perforated ulcer when she was 46 years old.  Has been on naproxen 500 mg twice daily for years.  Has recently decreased it to once daily since having the symptoms.  Started pantoprazole 40 mg daily in January and has been on Carafate just recently.  I am going to increase pantoprazole to twice daily.  We will continue the Carafate.  Needs prescription sent for both of those.  We will plan for EGD with Dr. Candis Schatz. ?*Family history of colon cancer in her mother at age 1: Patient's last colonoscopy was well over 10 years ago.  She does not wish to schedule at this time, but says that she we will plan to do it later this year ? ?**The risks, benefits, and alternatives to EGD were discussed with the patient and she consents to proceed. ?  ? ? ?CC:  [facility], [facility] ? ?  ?

## 2021-09-02 ENCOUNTER — Ambulatory Visit (HOSPITAL_BASED_OUTPATIENT_CLINIC_OR_DEPARTMENT_OTHER): Payer: 59 | Admitting: Orthopaedic Surgery

## 2021-09-02 ENCOUNTER — Ambulatory Visit (INDEPENDENT_AMBULATORY_CARE_PROVIDER_SITE_OTHER): Payer: 59

## 2021-09-02 DIAGNOSIS — M25562 Pain in left knee: Secondary | ICD-10-CM

## 2021-09-02 DIAGNOSIS — G8929 Other chronic pain: Secondary | ICD-10-CM

## 2021-09-02 NOTE — Progress Notes (Signed)
? ?                            ? ? ?Chief Complaint: Left knee pain ?  ? ? ?History of Present Illness:  ? ?09/02/2021 presents today for follow-up of her left knee.  Symptoms have not changed.  She still has persistent pain about the lateral aspect of the knee.  She is having a hard time with hip as well as pain laying directly on the left hip. ? ? ?Lisa Ryan is a 46 y.o. female presents with ongoing left knee pain that has been worse over the course of the last several months.  She does have a history of 2 previous what sounds like left meniscal debridements initially performed by Dr. Percell Miller Senior in 1993 and then subsequently again by Dr. Percell Miller Junior in 2012.  She states that since that time her left knee has never felt the same.  She has progressively worsening pain with most activities and even with weightbearing.  She states that she experiences the pain both medially and laterally about the IT band.  She has previously undergone physical therapy now for several months for strengthening of the left knee with minimal relief.  She is taking 500 mg of naproxen with limited relief.  She did have an injection in this left knee which was done by Dr. Onnie Graham 1 year prior with only approximately 1 week of relief.  She has been icing the left knee.  She states that this is stiff in the morning and feels painful by the day.  She feels like the knee is giving out as well.  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  She is hoping to stay active as her daughter is very interested in equestrian although she has not been able to do this as a result of her knee pain ? ? ? ?Surgical History:   ?Left knee arthroscopy with meniscal debridement in 1993 as well as 2012 ? ?PMH/PSH/Family History/Social History/Meds/Allergies:   ? ?Past Medical History:  ?Diagnosis Date  ? Anemia   ? Anginal pain ([place])   ? due to asthma meds  ? Anxiety state, unspecified 10/12/2007  ? Arthritis   ? Asthma   ? CHEST PAIN, ATYPICAL 02/18/2010  ? after  using albuterol  ? CHEST PAIN-PRECORDIAL 02/19/2010  ? after albuterol  ? CHF (congestive heart failure) ([place]) 1999  ? During Pregnancy, After C section  ? COPD (chronic obstructive pulmonary disease) ([place])   ? Diverticulosis   ? FATIGUE 09/28/2007  ? Fatty liver   ? GASTROENTERITIS 10/03/2008  ? GERD (gastroesophageal reflux disease)   ? occ takes OTC ttums  ? History of vitamin D deficiency   ? Hypothyroidism   ? MIGRAINE HEADACHE 03/04/2010  ? Obesity   ? PEDAL EDEMA 02/18/2010  ? comes and goes   ? PEPTIC ULCER DISEASE 09/28/2007  ? 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 PONV (postoperative nausea and vomiting)   ? SEIZURE DISORDER 09/28/2007  ? SUI (stress urinary incontinence, female)   ? Thyroid nodule   ? Tobacco abuse   ? WEIGHT GAIN 09/28/2007  ? ?Past Surgical History:  ?Procedure Laterality Date  ? ANKLE SURGERY    ? left  ? APPENDECTOMY    ? CARDIAC CATHETERIZATION  2015  ? CESAREAN SECTION    ? x3  ? CHOLECYSTECTOMY    ? DILITATION & CURRETTAGE/HYSTROSCOPY WITH NOVASURE ABLATION N/A 12/26/2015  ? Procedure:  DILATATION & CURETTAGE/HYSTEROSCOPY WITH ENDOMETRIAL ABLATION;  Surgeon: Jonnie Kind, MD;  Location: [facility];  Service: Gynecology;  Laterality: N/A;  ? ESOPHAGOGASTRODUODENOSCOPY  2012  ? KNEE ARTHROSCOPY Left   ? x2  ? MOUTH LESION EXCISIONAL BIOPSY  04/2011  ? pre-cancerous, pallet of mouth  ? SHOULDER ARTHROSCOPY WITH ROTATOR CUFF REPAIR Right 08/16/2019  ? Procedure: Right shoulder arthroscopy, subacromial decompression, distal clavicle resection, rotator cuff repair;  Surgeon: Justice Britain, MD;  Location: [facility];  Service: Orthopedics;  Laterality: Right;  149mn  ? SHOULDER ARTHROSCOPY WITH ROTATOR CUFF REPAIR Right 10/02/2019  ? Procedure: SHOULDER ARTHROSCOPY WITH ROTATOR CUFF REPAIR;  Surgeon: SJustice Britain MD;  Location: [facility];  Service: Orthopedics;  Laterality: Right;  917m  ? ?Social History  ? ?Socioeconomic History  ? Marital status: Married  ?  Spouse name: Not on  file  ? Number of children: 3  ? Years of education: Not on file  ? Highest education level: Not on file  ?Occupational History  ?  Employer: [employer]  ?Tobacco Use  ? Smoking status: Every Day  ?  Packs/day: 1.00  ?  Years: 15.00  ?  Pack years: 15.00  ?  Types: Cigarettes  ? Smokeless tobacco: Never  ?Vaping Use  ? Vaping Use: Never used  ?Substance and Sexual Activity  ? Alcohol use: Yes  ?  Comment: occ  ? Drug use: No  ? Sexual activity: Yes  ?  Birth control/protection: Surgical  ?Other Topics Concern  ? Not on file  ?Social History Narrative  ? Not on file  ? ?Social Determinants of Health  ? ?Financial Resource Strain: Not on file  ?Food Insecurity: Not on file  ?Transportation Needs: Not on file  ?Physical Activity: Not on file  ?Stress: Not on file  ?Social Connections: Not on file  ? ?Family History  ?Problem Relation Age of Onset  ? Cerebral aneurysm Mother   ? Colon cancer Mother [id number]? Rheum arthritis Mother   ? Heart disease Father   ? Liver disease Father   ? Diabetes Sister   ? Atrial fibrillation Sister   ? Hypertension Sister   ? Healthy Sister   ? Muscular dystrophy Brother   ? Diabetes Brother   ? Kidney disease Maternal Grandmother   ? Colon cancer Maternal Grandmother   ? Healthy Son   ? Colon cancer Maternal Aunt   ? ?Allergies  ?Allergen Reactions  ? Albuterol Anxiety and Other (See Comments)  ?  Increased HR 130s (pt tolerates duoneb)  ? Hydrocodone-Acetaminophen Nausea And Vomiting  ? Morphine Other (See Comments)  ?  hallucinations  ? Adhesive [Tape] Rash  ?  Paper tape is ok  ? ?Current Outpatient Medications  ?Medication Sig Dispense Refill  ? DULoxetine (CYMBALTA) 30 MG capsule TAKE 3 CAPSULES BY MOUTH EVERY DAY AT BEDTIME (Patient taking differently: 60 mg at bedtime.) 90 capsule 2  ? ipratropium-albuterol (DUONEB) 0.5-2.5 (3) MG/3ML SOLN Take 3 mLs by nebulization every 6 (six) hours as needed. 360 mL 0  ? levalbuterol (XOPENEX HFA) 45 MCG/ACT inhaler Inhale 1-2 puffs into  the lungs daily as needed for wheezing or shortness of breath.    ? Melatonin 10 MG TABS Take 20 mg by mouth at bedtime.    ? naproxen (NAPROSYN) 500 MG tablet TAKE 1 TABLET(500 MG) BY MOUTH TWICE DAILY WITH A MEAL 60 tablet 1  ? ondansetron (ZOFRAN) 4 MG tablet Take 1 tablet (4 mg total) by mouth  every 6 (six) hours. (Patient taking differently: Take 4 mg by mouth every 8 (eight) hours as needed.) 12 tablet 0  ? oxyCODONE-acetaminophen (PERCOCET/ROXICET) 5-325 MG tablet Take 1 tablet by mouth every 6 (six) hours as needed for severe pain. 10 tablet 0  ? pantoprazole (PROTONIX) 40 MG tablet Take 1 tablet (40 mg total) by mouth 2 (two) times daily before a meal. 60 tablet 3  ? progesterone (PROMETRIUM) 200 MG capsule Take 3 capsules (600 mg total) by mouth at bedtime. 90 capsule 3  ? sucralfate (CARAFATE) 1 g tablet Take 1 tablet (1 g total) by mouth 4 (four) times daily -  with meals and at bedtime. 15 tablet 0  ? sucralfate (CARAFATE) 1 g tablet Take 1 tablet (1 g total) by mouth 4 (four) times daily -  with meals and at bedtime. 120 tablet 3  ? ?No current facility-administered medications for this visit.  ? ?No results found. ? ?Review of Systems:   ?A ROS was performed including pertinent positives and negatives as documented in the HPI. ? ?Physical Exam :   ?Constitutional: NAD and appears stated age ?Neurological: Alert and oriented ?Psych: Appropriate affect and cooperative ?There were no vitals taken for this visit.  ? ?Comprehensive Musculoskeletal Exam:   ? ?  ?Musculoskeletal Exam  ?Gait Normal  ?Alignment Normal  ? Right Left  ?Inspection Normal Normal  ?Palpation    ?Tenderness None Lateral femoral condyle near IT band, medial joint  ?Crepitus None None  ?Effusion None None  ?Range of Motion    ?Extension 0 0  ?Flexion 135 135  ?Strength    ?Extension 5/5 5/5  ?Flexion 5/5 5/5  ?Ligament Exam     ?Generalized Laxity No No  ?Lachman Negative Negative   ?Pivot Shift Negative Negative  ?Anterior Drawer  Negative Negative  ?Valgus at 0 Negative Negative  ?Valgus at 20 Negative Negative  ?Varus at 0 0 0  ?Varus at 20   0 0  ?Posterior Drawer at 90 0 0  ?Vascular/Lymphatic Exam    ?Edema None None  ?Venous S

## 2021-09-03 NOTE — Telephone Encounter (Signed)
PA submitted via covermymeds.

## 2021-09-03 NOTE — Progress Notes (Signed)
Agree with the assessment and plan as outlined by Alonza Bogus, PA-C.  If not already attempted, patient should try using acetaminophen instead of NSAIDs for her arthritic pain.  If these acetaminophen is not helpful, a selective Cox inhibitor such as Mobic would pose less risk of peptic ulcer disease compared to naproxen. ? ?Waverly Chavarria E. Candis Schatz, MD  ?[facility] ? ?

## 2021-09-06 ENCOUNTER — Other Ambulatory Visit: Payer: Self-pay

## 2021-09-06 ENCOUNTER — Emergency Department (HOSPITAL_COMMUNITY)
Admission: EM | Admit: 2021-09-06 | Discharge: 2021-09-06 | Disposition: A | Discharge status: Home / Self Care | Payer: 59 | Attending: Emergency Medicine | Admitting: Emergency Medicine

## 2021-09-06 ENCOUNTER — Emergency Department (HOSPITAL_COMMUNITY): Payer: 59

## 2021-09-06 ENCOUNTER — Encounter (HOSPITAL_COMMUNITY): Payer: Self-pay | Admitting: Emergency Medicine

## 2021-09-06 DIAGNOSIS — J029 Acute pharyngitis, unspecified: Secondary | ICD-10-CM | POA: Insufficient documentation

## 2021-09-06 DIAGNOSIS — Z20822 Contact with and (suspected) exposure to covid-19: Secondary | ICD-10-CM | POA: Diagnosis not present

## 2021-09-06 DIAGNOSIS — H66003 Acute suppurative otitis media without spontaneous rupture of ear drum, bilateral: Secondary | ICD-10-CM

## 2021-09-06 DIAGNOSIS — J449 Chronic obstructive pulmonary disease, unspecified: Secondary | ICD-10-CM | POA: Insufficient documentation

## 2021-09-06 DIAGNOSIS — J441 Chronic obstructive pulmonary disease with (acute) exacerbation: Secondary | ICD-10-CM

## 2021-09-06 DIAGNOSIS — R059 Cough, unspecified: Secondary | ICD-10-CM | POA: Diagnosis present

## 2021-09-06 DIAGNOSIS — J069 Acute upper respiratory infection, unspecified: Secondary | ICD-10-CM

## 2021-09-06 LAB — COMPREHENSIVE METABOLIC PANEL
ALT: 15 U/L (ref 0–44)
AST: 11 U/L — ABNORMAL LOW (ref 15–41)
Albumin: 3.4 g/dL — ABNORMAL LOW (ref 3.5–5.0)
Alkaline Phosphatase: 78 U/L (ref 38–126)
Anion gap: 7 (ref 5–15)
BUN: 10 mg/dL (ref 6–20)
CO2: 25 mmol/L (ref 22–32)
Calcium: 8.7 mg/dL — ABNORMAL LOW (ref 8.9–10.3)
Chloride: 107 mmol/L (ref 98–111)
Creatinine, Ser: 0.6 mg/dL (ref 0.44–1.00)
GFR, Estimated: >60 mL/min (ref >60)
Glucose, Bld: 131 mg/dL — ABNORMAL HIGH (ref 70–99)
Potassium: 2.8 mmol/L — ABNORMAL LOW (ref 3.5–5.1)
Sodium: 139 mmol/L (ref 135–145)
Total Bilirubin: 0.4 mg/dL (ref 0.3–1.2)
Total Protein: 6.5 g/dL (ref 6.5–8.1)

## 2021-09-06 LAB — CBC WITH DIFFERENTIAL/PLATELET
Abs Immature Granulocytes: 0.04 10*3/uL (ref 0.00–0.07)
Basophils Absolute: 0.1 10*3/uL (ref 0.0–0.1)
Basophils Relative: 1 %
Eosinophils Absolute: 0.2 10*3/uL (ref 0.0–0.5)
Eosinophils Relative: 2 %
HCT: 38.4 % (ref 36.0–46.0)
Hemoglobin: 13 g/dL (ref 12.0–15.0)
Immature Granulocytes: 0 %
Lymphocytes Relative: 25 %
Lymphs Abs: 3.3 10*3/uL (ref 0.7–4.0)
MCH: 33.9 pg (ref 26.0–34.0)
MCHC: 33.9 g/dL (ref 30.0–36.0)
MCV: 100.3 fL — ABNORMAL HIGH (ref 80.0–100.0)
Monocytes Absolute: 0.8 10*3/uL (ref 0.1–1.0)
Monocytes Relative: 6 %
Neutro Abs: 8.5 10*3/uL — ABNORMAL HIGH (ref 1.7–7.7)
Neutrophils Relative %: 66 %
Platelets: 306 10*3/uL (ref 150–400)
RBC: 3.83 MIL/uL — ABNORMAL LOW (ref 3.87–5.11)
RDW: 12.4 % (ref 11.5–15.5)
WBC: 12.8 10*3/uL — ABNORMAL HIGH (ref 4.0–10.5)
nRBC: 0 % (ref 0.0–0.2)

## 2021-09-06 LAB — URINALYSIS, ROUTINE W REFLEX MICROSCOPIC
Bilirubin Urine: NEGATIVE
Glucose, UA: NEGATIVE mg/dL
Hgb urine dipstick: NEGATIVE
Ketones, ur: NEGATIVE mg/dL
Leukocytes,Ua: NEGATIVE
Nitrite: NEGATIVE
Protein, ur: NEGATIVE mg/dL
Specific Gravity, Urine: 1.006 (ref 1.005–1.030)
pH: 6 (ref 5.0–8.0)

## 2021-09-06 LAB — POC URINE PREG, ED: Preg Test, Ur: NEGATIVE

## 2021-09-06 LAB — APTT: aPTT: 26 seconds (ref 24–36)

## 2021-09-06 LAB — RESP PANEL BY RT-PCR (FLU A&B, COVID) ARPGX2
Influenza A by PCR: NEGATIVE
Influenza B by PCR: NEGATIVE
SARS Coronavirus 2 by RT PCR: NEGATIVE

## 2021-09-06 LAB — PROTIME-INR
INR: 1 (ref 0.8–1.2)
Prothrombin Time: 12.8 seconds (ref 11.4–15.2)

## 2021-09-06 LAB — LACTIC ACID, PLASMA: Lactic Acid, Venous: 2.4 mmol/L (ref 0.5–1.9)

## 2021-09-06 MED ORDER — BENZONATATE 100 MG PO CAPS: 100.0000 mg | ORAL_CAPSULE | Freq: Three times a day (TID) | ORAL | 0 refills | Status: DC

## 2021-09-06 MED ORDER — SODIUM CHLORIDE 0.9 % IV BOLUS
1000.0000 mL | Freq: Once | INTRAVENOUS | Status: AC
  Administered 2021-09-06: 1000 mL via INTRAVENOUS

## 2021-09-06 MED ORDER — AMOXICILLIN-POT CLAVULANATE 875-125 MG PO TABS: 1.0000 | ORAL_TABLET | Freq: Two times a day (BID) | ORAL | 0 refills | Status: DC

## 2021-09-06 MED ORDER — LEVALBUTEROL HCL 0.63 MG/3ML IN NEBU
1.2500 mg | INHALATION_SOLUTION | RESPIRATORY_TRACT | Status: AC
  Administered 2021-09-06: 1.25 mg via RESPIRATORY_TRACT
  Filled 2021-09-06: qty 6

## 2021-09-06 MED ORDER — DOXYCYCLINE HYCLATE 100 MG PO TABS
100.0000 mg | ORAL_TABLET | Freq: Once | ORAL | Status: AC
  Administered 2021-09-06: 100 mg via ORAL
  Filled 2021-09-06: qty 1

## 2021-09-06 MED ORDER — BENZONATATE 100 MG PO CAPS
200.0000 mg | ORAL_CAPSULE | Freq: Once | ORAL | Status: AC
  Administered 2021-09-06: 200 mg via ORAL
  Filled 2021-09-06: qty 2

## 2021-09-06 MED ORDER — POTASSIUM CHLORIDE CRYS ER 20 MEQ PO TBCR
40.0000 meq | EXTENDED_RELEASE_TABLET | Freq: Once | ORAL | Status: AC
  Administered 2021-09-06: 40 meq via ORAL
  Filled 2021-09-06: qty 2

## 2021-09-06 MED ORDER — LEVALBUTEROL HCL 1.25 MG/0.5ML IN NEBU
1.2500 mg | INHALATION_SOLUTION | Freq: Once | RESPIRATORY_TRACT | Status: AC
  Administered 2021-09-06: 1.25 mg via RESPIRATORY_TRACT
  Filled 2021-09-06: qty 0.5

## 2021-09-06 NOTE — ED Triage Notes (Signed)
Patient c/o productive cough with thick yellow, brown sputum with fevers. Patient states has hx of COPD and has been taking Z-pack, Mucinex, '20mg'$  of Prednisone since Thursday. Per patient no improvement in cough and woke this morning with left ear pain/drainage and sore throat. Patient states last fever was last night, none since.  ?

## 2021-09-06 NOTE — ED Provider Notes (Signed)
?[facility] ?Provider Note ? ? ?CSN: [id number] ?Arrival date & time: 09/06/21  1138 ? ?  ? ?History ? ?Chief Complaint  ?Patient presents with  ? Cough  ? ? ?Lisa Ryan is a 46 y.o. female. ? ?HPI ? ?  ? ?46 year old female comes in with chief complaint of cough.  Patient indicates that she has had a productive cough with yellow and brown sputum for the last several days.  She is also having fevers.  She saw PCP, started on azithromycin and prednisone, but her symptoms have persisted.  She is also having sore throat, URI-like symptoms, postnasal drip and congestion in her ear. ? ?Past medical history is positive for COPD.  She is taking Xopenex for it. ? ?Home Medications ?Prior to Admission medications   ?Medication Sig Start Date End Date Taking? Authorizing Provider  ?DULoxetine (CYMBALTA) 30 MG capsule TAKE 3 CAPSULES BY MOUTH EVERY DAY AT BEDTIME ?Patient taking differently: 60 mg at bedtime. 12/08/20   Ailene Ards, NP  ?ipratropium-albuterol (DUONEB) 0.5-2.5 (3) MG/3ML SOLN Take 3 mLs by nebulization every 6 (six) hours as needed. 04/17/18   Domenic Polite, MD  ?levalbuterol [facility] HFA) 45 MCG/ACT inhaler Inhale 1-2 puffs into the lungs daily as needed for wheezing or shortness of breath.    [provider]  ?Melatonin 10 MG TABS Take 20 mg by mouth at bedtime.    [provider]  ?naproxen (NAPROSYN) 500 MG tablet TAKE 1 TABLET(500 MG) BY MOUTH TWICE DAILY WITH A MEAL 10/28/20   Anastasio Champion, Nimish C, MD  ?ondansetron (ZOFRAN) 4 MG tablet Take 1 tablet (4 mg total) by mouth every 6 (six) hours. ?Patient taking differently: Take 4 mg by mouth every 8 (eight) hours as needed. 08/26/21   Wilnette Kales, PA  ?oxyCODONE-acetaminophen (PERCOCET/ROXICET) 5-325 MG tablet Take 1 tablet by mouth every 6 (six) hours as needed for severe pain. 08/26/21   Wilnette Kales, PA  ?pantoprazole (PROTONIX) 40 MG tablet Take 1 tablet (40 mg total) by mouth 2 (two) times daily  before a meal. 09/01/21   Zehr, Janett Billow D, PA-C  ?progesterone (PROMETRIUM) 200 MG capsule Take 3 capsules (600 mg total) by mouth at bedtime. 10/13/20   Doree Albee, MD  ?sucralfate (CARAFATE) 1 g tablet Take 1 tablet (1 g total) by mouth 4 (four) times daily -  with meals and at bedtime. 08/26/21   Wilnette Kales, PA  ?sucralfate (CARAFATE) 1 g tablet Take 1 tablet (1 g total) by mouth 4 (four) times daily -  with meals and at bedtime. 09/01/21   Zehr, Laban Emperor, PA-C  ?   ? ?Allergies    ?Albuterol, Hydrocodone-acetaminophen, Morphine, and Adhesive [tape]   ? ?Review of Systems   ?Review of Systems  ?All other systems reviewed and are negative. ? ?Physical Exam ?Updated Vital Signs ?BP 121/78   Pulse 78   Temp 97.8 ?F (36.6 ?C) (Oral)   Resp (!) 22   Ht '5\' 7"'$  (1.702 m)   Wt 81.6 kg   SpO2 96%   BMI 28.19 kg/m?  ?Physical Exam ?Vitals and nursing note reviewed.  ?Constitutional:   ?   Appearance: She is well-developed.  ?HENT:  ?   Head: Atraumatic.  ?Cardiovascular:  ?   Rate and Rhythm: Normal rate.  ?Pulmonary:  ?   Effort: Pulmonary effort is normal.  ?   Breath sounds: Wheezing present.  ?Musculoskeletal:  ?   Cervical back: Normal range of  motion and neck supple.  ?Skin: ?   General: Skin is warm and dry.  ?Neurological:  ?   Mental Status: She is alert and oriented to person, place, and time.  ? ? ?ED Results / Procedures / Treatments   ?Labs ?(all labs ordered are listed, but only abnormal results are displayed) ?Labs Reviewed  ?CULTURE, BLOOD (ROUTINE X 2)  ?CULTURE, BLOOD (ROUTINE X 2)  ?RESP PANEL BY RT-PCR (FLU A&B, COVID) ARPGX2  ?LACTIC ACID, PLASMA  ?LACTIC ACID, PLASMA  ?COMPREHENSIVE METABOLIC PANEL  ?CBC WITH DIFFERENTIAL/PLATELET  ?PROTIME-INR  ?APTT  ?URINALYSIS, ROUTINE W REFLEX MICROSCOPIC  ?POC URINE PREG, ED  ? ? ?EKG ?None ? ?Radiology ?DG Chest Port 1 View ? ?Result Date: 09/06/2021 ?CLINICAL DATA:  Productive cough and fever. EXAM: PORTABLE CHEST 1 VIEW COMPARISON:  12/25/2018  FINDINGS: Lungs are adequately inflated without focal airspace consolidation or effusion. Cardiomediastinal silhouette and remainder of the exam is unchanged. IMPRESSION: No active disease. Electronically Signed   By: Marin Olp M.D.   On: 09/06/2021 15:19   ? ?Procedures ?Procedures  ? ? ?Medications Ordered in ED ?Medications  ?levalbuterol (XOPENEX) nebulizer solution 1.25 mg (1.25 mg Nebulization Given 09/06/21 1459)  ?benzonatate (TESSALON) capsule 200 mg (200 mg Oral Given 09/06/21 1458)  ? ? ?ED Course/ Medical Decision Making/ A&P ?  ?                        ?Medical Decision Making ?Amount and/or Complexity of Data Reviewed ?Labs: ordered. ?Radiology: ordered. ?ECG/medicine tests: ordered. ? ?Risk ?Prescription drug management. ? ? ?This patient presents to the ED with chief complaint(s) of cough, congestion, shortness of breath that is not improving with pertinent past medical history of COPD and recent course of antibiotics and prednisone which further complicates the presenting complaint. The complaint involves an extensive differential diagnosis and treatment options and also carries with it a high risk of complications and morbidity.   ? ?The differential diagnosis includes : ?COPD exacerbation, superimposed pneumonia, CHF, viral upper respiratory infection ? ?The initial plan is to order basic labs, x-ray of the chest. ?We will also give patient Xopenex. ? ? ?Additional history obtained: ?Additional history obtained from spouse ? ? ?Independent visualization of imaging: ?- Imaging that was independently visualized with scope of interpretation limited to determining acute life threatening conditions related to emergency care: X-ray the chest, which revealed no evidence of pneumonia, pneumothorax ? ?Patient is getting Xopenex treatment right now.  X-ray of the chest visualized independently, no evidence of any pneumonia. ? ?Dr. Sabra Heck will take over the care.  Lab results are pending. ? ? ?Final Clinical  Impression(s) / ED Diagnoses ?Final diagnoses:  ?None  ? ? ?Rx / DC Orders ?ED Discharge Orders   ? ? None  ? ?  ? ? ?  ?Varney Biles, MD ?09/06/21 1547 ? ?

## 2021-09-06 NOTE — ED Provider Notes (Signed)
At change of shift this care was signed out pending the rest of her lab work which overall has been unremarkable.  Her lactic acid was barely over 2, the patient is speaking in full sentences oxygenation of 100%, she does not appear to be acidotic, she is not pregnant, she does have some hypokalemia but was treated with oral potassium and can have this followed outpatient.  Minimal leukocytosis.  COVID-negative, flu negative and a chest x-ray which is unremarkable. ? ?On my exam she does have bilateral suppurative otitis media, she has some superficial ulcerations in the posterior soft palate and tonsillar pillars which suggest a viral cause of her overall illness.  She has been encouraged to continue to use a decongestant containing medications such as either Sudafed or DayQuil NyQuil or Mucinex D which she states that she has at home.  She also reports that she has a nebulizer and refills on her leave albuterol.  We will switch to Augmentin as this would have better coverage for otitis media ? ?The patient expressed understanding to the indications for return ?  ?Lisa Chapel, MD ?09/06/21 1853 ? ?

## 2021-09-06 NOTE — Discharge Instructions (Signed)
Your x-ray shows no signs of pneumonia, however on your exam it does appear that you have ear infections on the left and the right, the antibiotic may help with this.  Be aware that the antibiotic will likely not help with the sore throat or the coughing or the shortness of breath which is related to a virus.  Please finish the prednisone that you were given and make sure that you are taking your inhaler or your breathing treatments every 4 hours.  See your doctor within 2 days or the emergency department for worsening symptoms ?

## 2021-09-09 ENCOUNTER — Telehealth: Payer: Self-pay | Admitting: *Deleted

## 2021-09-09 NOTE — Telephone Encounter (Signed)
Dr. Candis Schatz, ? ?This pt is scheduled with you on 5/22 for an EGD.  Three days ago she was seen in the Ed for COPD exacerbation and URI.  It would be best to delay her procedure for six weeks to reduce the risk of airway reactivity and laryngospasm. ? ?Thanks, ? ?Osvaldo Angst ?

## 2021-09-10 NOTE — Telephone Encounter (Signed)
Lisa November, MD  Osvaldo Angst, CRNA; Algernon Huxley, RN   ? ?[place] ?John,  ?Agreed.  Thanks so much for looking at that.    ? ?Vaughan Basta,  ?Can you contact Ms. Breden and let her know we need to push her EGD back 6 weeks in order to reduce her risk of complications?  Please find an alternative date/time if able.  ? ?Thanks   ? ?

## 2021-09-10 NOTE — Telephone Encounter (Signed)
Insurance denied Pantoprazole. Please advise. ?

## 2021-09-10 NOTE — Telephone Encounter (Signed)
Left message for pt to call back  °

## 2021-09-11 ENCOUNTER — Ambulatory Visit: Payer: 59 | Admitting: Gastroenterology

## 2021-09-11 LAB — CULTURE, BLOOD (ROUTINE X 2)
Culture: NO GROWTH
Culture: NO GROWTH
Special Requests: ADEQUATE

## 2021-09-11 MED ORDER — OMEPRAZOLE 40 MG PO CPDR: 40.0000 mg | DELAYED_RELEASE_CAPSULE | Freq: Two times a day (BID) | ORAL | 3 refills | Status: DC

## 2021-09-11 NOTE — Telephone Encounter (Signed)
Spoke with pt and gave pt recommendations. EGD rescheduled to 11/04/21 at 8 am. Previsit scheduled for 10/05/21 at 1 pm. Pt wanted to know if she could have colonoscopy at the same time because having a colonoscopy was discussed at her last office visit and she would prefer to have them done on the same day if possible.  ?

## 2021-09-11 NOTE — Telephone Encounter (Signed)
Left message for pt to call back. Colon added to EGD.  ?

## 2021-09-14 NOTE — Telephone Encounter (Signed)
Spoke with pt and let her know that colonoscopy has been added to EGD. Pt verbalized understanding.  ?

## 2021-09-15 ENCOUNTER — Telehealth: Payer: Self-pay | Admitting: *Deleted

## 2021-09-15 NOTE — Telephone Encounter (Signed)
PRIOR AUTHORIZATION ? ?PA initiation date: 09/15/21  ?Medication: Omeprazole 40 mg ?Insurance Company: [employer] ?Submission completed electronically through Cover My Meds: Yes ? ?Will await insurance response re: approval/denial. ? ?Insurance card is under [employer] ?

## 2021-09-18 NOTE — Telephone Encounter (Signed)
Aretta Beaumont Key: [id number] - PA Case ID: [id number] - Rx #: [id number] Need help? Call us at [phone]  Outcome Denied on May 17 PA Case: [id number], Status: Denied,   Denial Rationale: Your medication request has been denied as it does not appear to meet medically necessary requirements. Plan rules require clinical parameters (diagnosis, lab values, test results, physical exam findings etc) be met for medical necessity approval. Information submitted does not indicate required parameter results were met. Coverage for Omeprazole 40MG Capsule Delayed Release is denied. It does not meet medical necessity. Omeprazole 40MG Capsule Delayed Release has been requested for the treatment of GERD. The information provided by your prescriber does not meet [employer]'s guideline. The guideline used is: Quantity Limit. Information provided does not show that the policy requirements have been met. The requirement(s) not met are: The decision is based on the dosing guidelines for [insert drug name]. These standards are set by the Food and Drug Administration (FDA). This medication has a maximum daily dosing of 20 mg. Your plan will cover the requested medication at or below the FDA approved maximum daily dosage. Therefore, coverage for Omeprazole is denied. Therefore, coverage for Omeprazole 40MG Capsule Delayed Release is denied. Questions? Contact [phone]. Drug Omeprazole 40MG dr capsules Building services engineer Prior Authorization Form [phone] NCPDP) Original Claim Info 9G

## 2021-09-21 ENCOUNTER — Encounter: Payer: 59 | Admitting: Gastroenterology

## 2021-09-21 NOTE — Telephone Encounter (Signed)
Please advise. Patient insurance will not cover Pantoprazole or Omeprazole 40 mg.

## 2021-09-22 MED ORDER — OMEPRAZOLE 20 MG PO CPDR: 20.0000 mg | DELAYED_RELEASE_CAPSULE | Freq: Every day | ORAL | 3 refills | Status: DC

## 2021-09-22 NOTE — Telephone Encounter (Signed)
Spoke with patient and Carafate is working okay. She would like me to send in Omeprazole 20 mg once daily and will continue Carafate as well. Informed patient to call or send mychart message in a few weeks with update on how that is working.

## 2021-09-24 ENCOUNTER — Emergency Department (HOSPITAL_COMMUNITY): Payer: 59

## 2021-09-24 ENCOUNTER — Other Ambulatory Visit: Payer: Self-pay

## 2021-09-24 ENCOUNTER — Encounter (HOSPITAL_COMMUNITY): Payer: Self-pay

## 2021-09-24 ENCOUNTER — Emergency Department (HOSPITAL_COMMUNITY)
Admission: EM | Admit: 2021-09-24 | Discharge: 2021-09-25 | Disposition: A | Discharge status: Home / Self Care | Payer: 59 | Attending: Emergency Medicine | Admitting: Emergency Medicine

## 2021-09-24 DIAGNOSIS — R079 Chest pain, unspecified: Secondary | ICD-10-CM

## 2021-09-24 DIAGNOSIS — R000 Tachycardia, unspecified: Secondary | ICD-10-CM | POA: Diagnosis not present

## 2021-09-24 DIAGNOSIS — R61 Generalized hyperhidrosis: Secondary | ICD-10-CM | POA: Diagnosis not present

## 2021-09-24 DIAGNOSIS — R072 Precordial pain: Secondary | ICD-10-CM | POA: Insufficient documentation

## 2021-09-24 DIAGNOSIS — R059 Cough, unspecified: Secondary | ICD-10-CM | POA: Diagnosis not present

## 2021-09-24 DIAGNOSIS — R0602 Shortness of breath: Secondary | ICD-10-CM | POA: Insufficient documentation

## 2021-09-24 LAB — BASIC METABOLIC PANEL
Anion gap: 9 (ref 5–15)
BUN: 14 mg/dL (ref 6–20)
CO2: 29 mmol/L (ref 22–32)
Calcium: 9.3 mg/dL (ref 8.9–10.3)
Chloride: 100 mmol/L (ref 98–111)
Creatinine, Ser: 0.95 mg/dL (ref 0.44–1.00)
GFR, Estimated: >60 mL/min (ref >60)
Glucose, Bld: 90 mg/dL (ref 70–99)
Potassium: 3.7 mmol/L (ref 3.5–5.1)
Sodium: 138 mmol/L (ref 135–145)

## 2021-09-24 LAB — CBC
HCT: 43.9 % (ref 36.0–46.0)
Hemoglobin: 14.7 g/dL (ref 12.0–15.0)
MCH: 33.7 pg (ref 26.0–34.0)
MCHC: 33.5 g/dL (ref 30.0–36.0)
MCV: 100.7 fL — ABNORMAL HIGH (ref 80.0–100.0)
Platelets: 318 10*3/uL (ref 150–400)
RBC: 4.36 MIL/uL (ref 3.87–5.11)
RDW: 12.8 % (ref 11.5–15.5)
WBC: 13.6 10*3/uL — ABNORMAL HIGH (ref 4.0–10.5)
nRBC: 0 % (ref 0.0–0.2)

## 2021-09-24 LAB — TROPONIN I (HIGH SENSITIVITY): Troponin I (High Sensitivity): 3 ng/L (ref <18)

## 2021-09-24 MED ORDER — IOHEXOL 350 MG/ML SOLN
100.0000 mL | Freq: Once | INTRAVENOUS | Status: AC | PRN
  Administered 2021-09-24: 100 mL via INTRAVENOUS

## 2021-09-24 MED ORDER — NITROGLYCERIN 0.4 MG SL SUBL
0.4000 mg | SUBLINGUAL_TABLET | Freq: Once | SUBLINGUAL | Status: AC
  Administered 2021-09-24: 0.4 mg via SUBLINGUAL
  Filled 2021-09-24: qty 1

## 2021-09-24 MED ORDER — SODIUM CHLORIDE 0.9 % IV BOLUS
500.0000 mL | Freq: Once | INTRAVENOUS | Status: AC
  Administered 2021-09-24: 500 mL via INTRAVENOUS

## 2021-09-24 MED ORDER — ASPIRIN 81 MG PO CHEW
324.0000 mg | CHEWABLE_TABLET | Freq: Once | ORAL | Status: AC
  Administered 2021-09-24: 324 mg via ORAL
  Filled 2021-09-24: qty 4

## 2021-09-24 NOTE — ED Triage Notes (Signed)
Pt reports she was walking the dog and had sudden onset of diaphoresis, intermittently felt like she was going to pass out with tunnel vision, chest and back pressure and sob. Pt says she feels like something is wrong and it may be cardiac related.

## 2021-09-24 NOTE — ED Provider Notes (Signed)
[facility] EMERGENCY DEPARTMENT Provider Note   CSN: [id number] Arrival date & time: 09/24/21  2008     History  Chief Complaint  Patient presents with   Shortness of Breath   Chest Pain    Back pressure/pain    Lisa Ryan is a 46 y.o. female.  Patient presents for evaluation of chest pain.  Patient reports that she has had multiple episodes of substernal chest pressure associated with diaphoresis, tunnel vision and feeling like she is going to pass out.  These occurred with exertion throughout the day.  She has had some mild shortness of breath.  Patient has developed a cough since she has been here but no coughing prior to arrival.  Pain is in the center of the chest but radiates to the neck and straight through her back.   HPI: A 46 year old patient with a history of obesity presents for evaluation of chest pain. Initial onset of pain was more than 6 hours ago. The patient's chest pain is worse with exertion and is relieved by nitroglycerin. The patient reports some diaphoresis. The patient's chest pain is middle- or left-sided, is not well-localized, is not described as heaviness/pressure/tightness, is not sharp and does radiate to the arms/jaw/neck. The patient does not complain of nausea. The patient has a family history of coronary artery disease in a first-degree relative with onset less than age 39. The patient has no history of stroke, has no history of peripheral artery disease, has not smoked in the past 90 days, denies any history of treated diabetes, is not hypertensive and has no history of hypercholesterolemia.   Home Medications Prior to Admission medications   Medication Sig Start Date End Date Taking? Authorizing Provider  acetaminophen (TYLENOL) 650 MG CR tablet Take 1,300 mg by mouth every 8 (eight) hours as needed for pain.    [provider]  amoxicillin-clavulanate (AUGMENTIN) 875-125 MG tablet Take 1 tablet by mouth every 12 (twelve) hours.  09/06/21   Noemi Chapel, MD  azithromycin (ZITHROMAX) 250 MG tablet Take by mouth. 09/03/21   [provider]  benzonatate (TESSALON) 100 MG capsule Take 1 capsule (100 mg total) by mouth every 8 (eight) hours. 09/06/21   Noemi Chapel, MD  DULoxetine (CYMBALTA) 30 MG capsule TAKE 3 CAPSULES BY MOUTH EVERY DAY AT BEDTIME Patient taking differently: 60 mg at bedtime. 12/08/20   Ailene Ards, NP  ipratropium-albuterol (DUONEB) 0.5-2.5 (3) MG/3ML SOLN Take 3 mLs by nebulization every 6 (six) hours as needed. 04/17/18   Domenic Polite, MD  levalbuterol [facility] HFA) 45 MCG/ACT inhaler Inhale 1-2 puffs into the lungs daily as needed for wheezing or shortness of breath.    [provider]  Melatonin 10 MG TABS Take 20 mg by mouth at bedtime.    [provider]  naproxen (NAPROSYN) 500 MG tablet TAKE 1 TABLET(500 MG) BY MOUTH TWICE DAILY WITH A MEAL Patient taking differently: Take 500 mg by mouth 2 (two) times daily with a meal. 10/28/20   Gosrani, Nimish C, MD  omeprazole (PRILOSEC) 20 MG capsule Take 1 capsule (20 mg total) by mouth daily. 09/22/21   Zehr, Janett Billow D, PA-C  ondansetron (ZOFRAN) 4 MG tablet Take 1 tablet (4 mg total) by mouth every 6 (six) hours. Patient not taking: Reported on 09/06/2021 08/26/21   Dion Saucier A, PA  oxyCODONE-acetaminophen (PERCOCET/ROXICET) 5-325 MG tablet Take 1 tablet by mouth every 6 (six) hours as needed for severe pain. Patient not taking: Reported on 09/06/2021 08/26/21  Dion Saucier A, PA  predniSONE (DELTASONE) 20 MG tablet Take 20 mg by mouth daily. 09/03/21   [provider]  progesterone (PROMETRIUM) 200 MG capsule Take 3 capsules (600 mg total) by mouth at bedtime. 10/13/20   Doree Albee, MD  sucralfate (CARAFATE) 1 g tablet Take 1 tablet (1 g total) by mouth 4 (four) times daily -  with meals and at bedtime. 08/26/21   Wilnette Kales, PA  sucralfate (CARAFATE) 1 g tablet Take 1 tablet (1 g total) by mouth 4 (four) times  daily -  with meals and at bedtime. Patient not taking: Reported on 09/06/2021 09/01/21   Zehr, Laban Emperor, PA-C      Allergies    Albuterol, Hydrocodone-acetaminophen, Morphine, and Adhesive [tape]    Review of Systems   Review of Systems  Physical Exam Updated Vital Signs BP 119/74   Pulse 88   Temp 97.6 F (36.4 C) (Oral)   Resp 18   Ht '5\' 7"'$  (1.702 m)   Wt 81.6 kg   SpO2 92%   BMI 28.19 kg/m  Physical Exam Vitals and nursing note reviewed.  Constitutional:      General: She is not in acute distress.    Appearance: She is well-developed.  HENT:     Head: Normocephalic and atraumatic.     Mouth/Throat:     Mouth: Mucous membranes are moist.  Eyes:     General: Vision grossly intact. Gaze aligned appropriately.     Extraocular Movements: Extraocular movements intact.     Conjunctiva/sclera: Conjunctivae normal.  Cardiovascular:     Rate and Rhythm: Regular rhythm. Tachycardia present.     Pulses: Normal pulses.     Heart sounds: Normal heart sounds, S1 normal and S2 normal. No murmur heard.   No friction rub. No gallop.  Pulmonary:     Effort: Pulmonary effort is normal. No respiratory distress.     Breath sounds: Normal breath sounds.  Abdominal:     General: Bowel sounds are normal.     Palpations: Abdomen is soft.     Tenderness: There is no abdominal tenderness. There is no guarding or rebound.     Hernia: No hernia is present.  Musculoskeletal:        General: No swelling.     Cervical back: Full passive range of motion without pain, normal range of motion and neck supple. No spinous process tenderness or muscular tenderness. Normal range of motion.     Right lower leg: No edema.     Left lower leg: No edema.  Skin:    General: Skin is warm and dry.     Capillary Refill: Capillary refill takes less than 2 seconds.     Findings: No ecchymosis, erythema, rash or wound.  Neurological:     General: No focal deficit present.     Mental Status: She is alert and  oriented to person, place, and time.     GCS: GCS eye subscore is 4. GCS verbal subscore is 5. GCS motor subscore is 6.     Cranial Nerves: Cranial nerves 2-12 are intact.     Sensory: Sensation is intact.     Motor: Motor function is intact.     Coordination: Coordination is intact.  Psychiatric:        Attention and Perception: Attention normal.        Mood and Affect: Mood normal.        Speech: Speech normal.  Behavior: Behavior normal.    ED Results / Procedures / Treatments   Labs (all labs ordered are listed, but only abnormal results are displayed) Labs Reviewed  CBC - Abnormal; Notable for the following components:      Result Value   WBC 13.6 (*)    MCV 100.7 (*)    All other components within normal limits  BRAIN NATRIURETIC PEPTIDE - Abnormal; Notable for the following components:   B Natriuretic Peptide 233.0 (*)    All other components within normal limits  BASIC METABOLIC PANEL  LIPASE, BLOOD  HEPATIC FUNCTION PANEL  TROPONIN I (HIGH SENSITIVITY)  TROPONIN I (HIGH SENSITIVITY)    EKG EKG Interpretation  Date/Time:  Thursday Sep 24 2021 20:53:06 EDT Ventricular Rate:  109 PR Interval:  132 QRS Duration: 72 QT Interval:  290 QTC Calculation: 390 R Axis:   70 Text Interpretation: Sinus tachycardia Otherwise normal ECG When compared with ECG of 06-Sep-2021 15:08, PREVIOUS ECG IS PRESENT Confirmed by Orpah Greek [phone]) on 09/24/2021 11:08:26 PM  Radiology CT Angio Chest Pulmonary Embolism (PE) W or WO Contrast  Result Date: 09/24/2021 CLINICAL DATA:  Chest pain, shortness of breath, diaphoresis, evaluate for PE EXAM: CT ANGIOGRAPHY CHEST WITH CONTRAST TECHNIQUE: Multidetector CT imaging of the chest was performed using the standard protocol during bolus administration of intravenous contrast. Multiplanar CT image reconstructions and MIPs were obtained to evaluate the vascular anatomy. RADIATION DOSE REDUCTION: This exam was performed according  to the departmental dose-optimization program which includes automated exposure control, adjustment of the mA and/or kV according to patient size and/or use of iterative reconstruction technique. CONTRAST:  165m OMNIPAQUE IOHEXOL 350 MG/ML SOLN COMPARISON:  Chest radiograph dated 09/24/2021. CTA chest dated 04/10/2018. FINDINGS: Cardiovascular: Satisfactory opacification the bilateral pulmonary arteries to the segmental level. No evidence of pulmonary embolism. Although not tailored for evaluation of the thoracic aorta, there is no evidence of thoracic aortic aneurysm or dissection. The heart is normal in size.  No pericardial effusion. Mediastinum/Nodes: No suspicious mediastinal lymphadenopathy. Visualized thyroid is unremarkable. Lungs/Pleura: Mild biapical pleural-parenchymal scarring. No focal consolidation. Evaluation lung parenchyma is constrained by respiratory motion. Within that constraint, there are no suspicious pulmonary nodules. No pleural effusion or pneumothorax. Upper Abdomen: Visualized upper abdomen is grossly unremarkable. Musculoskeletal: Visualized osseous structures are within normal limits. Review of the MIP images confirms the above findings. IMPRESSION: No evidence of pulmonary embolism. Negative CT chest. Electronically Signed   By: SJulian HyM.D.   On: 09/24/2021 23:56   DG Chest Port 1 View  Result Date: 09/24/2021 CLINICAL DATA:  Chest pain, shortness of breath, near syncope EXAM: PORTABLE CHEST 1 VIEW COMPARISON:  09/06/2021 FINDINGS: The heart size and mediastinal contours are within normal limits. Both lungs are clear. The visualized skeletal structures are unremarkable. IMPRESSION: No active disease. Electronically Signed   By: KRolm BaptiseM.D.   On: 09/24/2021 22:04    Procedures Procedures    Medications Ordered in ED Medications  aspirin chewable tablet 324 mg (324 mg Oral Given 09/24/21 2329)  nitroGLYCERIN (NITROSTAT) SL tablet 0.4 mg (0.4 mg Sublingual  Given 09/24/21 2329)  sodium chloride 0.9 % bolus 500 mL (500 mLs Intravenous New Bag/Given 09/24/21 2329)  iohexol (OMNIPAQUE) 350 MG/ML injection 100 mL (100 mLs Intravenous Contrast Given 09/24/21 2337)    ED Course/ Medical Decision Making/ A&P   HEAR Score: 4  Medical Decision Making Amount and/or Complexity of Data Reviewed Labs: ordered. Radiology: ordered.  Risk OTC drugs. Prescription drug management.   Patient presents to the emergency department for evaluation of chest pain.  Patient with complaints of pain at his in the center of the chest, radiated to the neck and back.  She has been having pain intermittently for a number of days and then more persistently today.  Patient's hear score is 5.  Cardiac risk factors are increased BMI, smoking and family history of heart disease.  Patient is EKG at arrival does not show ischemia or infarct.  Troponins negative x2.  She did undergo PE study to further evaluate for her pain, no evidence of aortic syndrome or PE.  I discussed disposition with the patient.  She does have an increased risk based on her presentation and risk factors.  I did offer her admission.  Additionally, however, I did discuss with her the possibility of outpatient follow-up properly.  Given both of these options, she would prefer to go home.  She is currently pain-free.  She was therefore counseled to take an aspirin a day, follow-up with cardiology promptly and return if there is any recurrent chest pain.        Final Clinical Impression(s) / ED Diagnoses Final diagnoses:  Chest pain, unspecified type    Rx / DC Orders ED Discharge Orders          Ordered    Ambulatory referral to Cardiology        09/25/21 0218              Orpah Greek, MD 09/25/21 [phone]

## 2021-09-25 LAB — HEPATIC FUNCTION PANEL
ALT: 28 U/L (ref 0–44)
AST: 17 U/L (ref 15–41)
Albumin: 3.7 g/dL (ref 3.5–5.0)
Alkaline Phosphatase: 78 U/L (ref 38–126)
Bilirubin, Direct: 0.1 mg/dL (ref 0.0–0.2)
Indirect Bilirubin: 0 mg/dL — ABNORMAL LOW (ref 0.3–0.9)
Total Bilirubin: 0.1 mg/dL — ABNORMAL LOW (ref 0.3–1.2)
Total Protein: 6.6 g/dL (ref 6.5–8.1)

## 2021-09-25 LAB — BRAIN NATRIURETIC PEPTIDE: B Natriuretic Peptide: 233 pg/mL — ABNORMAL HIGH (ref 0.0–100.0)

## 2021-09-25 LAB — TROPONIN I (HIGH SENSITIVITY): Troponin I (High Sensitivity): 3 ng/L (ref <18)

## 2021-09-25 LAB — LIPASE, BLOOD: Lipase: 24 U/L (ref 11–51)

## 2021-09-25 NOTE — Discharge Instructions (Signed)
Take an aspirin daily and follow-up with cardiology.  Return to the ER immediately for recurrent chest pain.

## 2021-09-30 ENCOUNTER — Ambulatory Visit (HOSPITAL_BASED_OUTPATIENT_CLINIC_OR_DEPARTMENT_OTHER): Payer: 59 | Admitting: Orthopaedic Surgery

## 2021-10-05 ENCOUNTER — Encounter: Payer: Self-pay | Admitting: Gastroenterology

## 2021-10-05 NOTE — Telephone Encounter (Signed)
Noted on chart that patient is a same day cancellation for Pre Visit appt- will wait for patient to call back to the office to reschedule PV appt- PV and procedure cancelled at this time

## 2021-10-08 ENCOUNTER — Encounter: Payer: Self-pay | Admitting: Internal Medicine

## 2021-10-08 ENCOUNTER — Ambulatory Visit (INDEPENDENT_AMBULATORY_CARE_PROVIDER_SITE_OTHER): Payer: 59 | Admitting: Internal Medicine

## 2021-10-08 VITALS — BP 104/70 | HR 82 | Ht 66.0 in | Wt 185.0 lb

## 2021-10-08 DIAGNOSIS — R072 Precordial pain: Secondary | ICD-10-CM

## 2021-10-08 DIAGNOSIS — R1013 Epigastric pain: Secondary | ICD-10-CM | POA: Diagnosis not present

## 2021-10-08 DIAGNOSIS — O903 Peripartum cardiomyopathy: Secondary | ICD-10-CM

## 2021-10-08 DIAGNOSIS — Z720 Tobacco use: Secondary | ICD-10-CM | POA: Diagnosis not present

## 2021-10-08 MED ORDER — NITROGLYCERIN 0.4 MG SL SUBL: 0.4000 mg | SUBLINGUAL_TABLET | SUBLINGUAL | 3 refills | Status: DC | PRN

## 2021-10-08 NOTE — Patient Instructions (Addendum)
Medication Instructions:  Your physician has recommended you make the following change in your medication:  START: Nitroglycerin 0.4 mg under your tongue as needed for Chest pain   If you have chest pain place 1 tablet under your tongue, wait 5 min If chest pain continues place a 2nd tablet under your tongue, wait 5 min If chest pain continues place a 3rd tablet under your tongue, wait 5 min If chest pain continues call 911  *If you need a refill on your cardiac medications before your next appointment, please call your pharmacy*   Lab Work: NONE If you have labs (blood work) drawn today and your tests are completely normal, you will receive your results only by: [place] (if you have MyChart) OR A paper copy in the mail If you have any lab test that is abnormal or we need to change your treatment, we will call you to review the results.   Testing/Procedures: Your physician has requested that you have a Cardiac Pet scan.  Please see instructions listed below.    Follow-Up: At [facility], you and your health needs are our priority.  As part of our continuing mission to provide you with exceptional heart care, we have created designated Provider Care Teams.  These Care Teams include your primary Cardiologist (physician) and Advanced Practice Providers (APPs -  Physician Assistants and Nurse Practitioners) who all work together to provide you with the care you need, when you need it.   Your next appointment:   4-5 month(s)  The format for your next appointment:   In Person  Provider:  Rudean Haskell, MD, Ermalinda Barrios, PA or Melina Copa, PA      Other Instructions How to Prepare for Your Cardiac PET/CT Stress Test:  1. Please do not take these medications before your test:   Medications that may interfere with the cardiac pharmacological stress agent (ex. nitrates - including erectile dysfunction medications or beta-blockers) the day of the exam. (Erectile  dysfunction medication should be held for at least 72 hrs prior to test) Theophylline containing medications for 12 hours. Dipyridamole 48 hours prior to the test. Your remaining medications may be taken with water.  2. Nothing to eat or drink, except water, 3 hours prior to arrival time.   NO caffeine/decaffeinated products, or chocolate 12 hours prior to arrival.  3. NO perfume, cologne or lotion  4. Total time is 1 to 2 hours; you may want to bring reading material for the waiting time.  5. Please report to Admitting at the [facility] Main Entrance 60 minutes early for your test.  [address], [place] [address]  Diabetic Preparation:  Hold oral medications. You may take NPH and Lantus insulin. Do not take Humalog or Humulin R (Regular Insulin) the day of your test. Check blood sugars prior to leaving the house. If able to eat breakfast prior to 3 hour fasting, you may take all medications, including your insulin, Do not worry if you miss your breakfast dose of insulin - start at your next meal.  IF YOU THINK YOU MAY BE PREGNANT, OR ARE NURSING PLEASE INFORM THE TECHNOLOGIST.  In preparation for your appointment, medication and supplies will be purchased.  Appointment availability is limited, so if you need to cancel or reschedule, please call the Radiology Department at [phone]  24 hours in advance to avoid a cancellation fee of $100.00  What to Expect After you Arrive:  Once you arrive and check in  for your appointment, you will be taken to a preparation room within the Radiology Department.  A technologist or Nurse will obtain your medical history, verify that you are correctly prepped for the exam, and explain the procedure.  Afterwards,  an IV will be started in your arm and electrodes will be placed on your skin for EKG monitoring during the stress portion of the exam. Then you will be escorted to the PET/CT scanner.  There, staff will get you  positioned on the scanner and obtain a blood pressure and EKG.  During the exam, you will continue to be connected to the EKG and blood pressure machines.  A small, safe amount of a radioactive tracer will be injected in your IV to obtain a series of pictures of your heart along with an injection of a stress agent.    After your Exam:  It is recommended that you eat a meal and drink a caffeinated beverage to counter act any effects of the stress agent.  Drink plenty of fluids for the remainder of the day and urinate frequently for the first couple of hours after the exam.  Your doctor will inform you of your test results within 7-10 business days.  For questions about your test or how to prepare for your test, please call: Marchia Bond, Cardiac Imaging Nurse Navigator  Gordy Clement, Cardiac Imaging Nurse Navigator Office: [phone]   Important Information About Sugar

## 2021-10-08 NOTE — Progress Notes (Signed)
Cardiology Office Note:    Date:  10/08/2021   ID:  Lisa Ryan, DOB 08-02-1975, MRN [id number]  PCP:  [place], [place] [facility] Cardiologist:  Werner Lean, MD     Referring MD: Orpah Greek,*   CC: Chest Pain Consulted for the evaluation of chest pain at the behest of Dr. Betsey Holiday  History of Present Illness:    Lisa Ryan is a 46 y.o. female with a hx of active tobacco abuse, peripartum cardiomyopathy with recovery (documented in 2011 and 2015) presenting with chest pain.  Patient notes that she is feeling better after getting nitroglycerin.   Was last feeling well six months ago. Able to go horseback riding, rope steers with no symptoms; would ride bikes with her children.  Has had new abdominal pain- to the point she has an EGD coming up.   09/24/21 felt like someone was trying to push her on her back.  Had two different episodes.  Has nausea and diaphoresis.  ED eval was unremarkable.She has improved after starting ASA.  Post her ED Eval is very fatigued- to the point she has to take naps.    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  No PND or orthopnea.  No weight gain, leg swelling, or abdominal swelling.  No syncope or near syncope. Notes  no palpitations or funny heart beats.     Past Medical History:  Diagnosis Date   Anemia    Anginal pain ([place])    due to asthma meds   Anxiety state, unspecified 10/12/2007   Arthritis    Asthma    CHEST PAIN, ATYPICAL 02/18/2010   after using albuterol   CHEST PAIN-PRECORDIAL 02/19/2010   after albuterol   CHF (congestive heart failure) ([place]) 1999   During Pregnancy, After C section   COPD (chronic obstructive pulmonary disease) ([place])    Diverticulosis    FATIGUE 09/28/2007   Fatty liver    GASTROENTERITIS 10/03/2008   GERD (gastroesophageal reflux disease)    occ  takes OTC ttums   History of vitamin D deficiency    Hypothyroidism    MIGRAINE HEADACHE 03/04/2010   Obesity    PEDAL EDEMA 02/18/2010   comes and goes    PEPTIC ULCER DISEASE 09/28/2007   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   PONV (postoperative nausea and vomiting)    SEIZURE DISORDER 09/28/2007   SUI (stress urinary incontinence, female)    Thyroid nodule    Tobacco abuse    WEIGHT GAIN 09/28/2007    Past Surgical History:  Procedure Laterality Date   ANKLE SURGERY     left   APPENDECTOMY     CARDIAC CATHETERIZATION  2015   CESAREAN SECTION     x3   CHOLECYSTECTOMY     DILITATION & CURRETTAGE/HYSTROSCOPY WITH NOVASURE ABLATION N/A 12/26/2015   Procedure: DILATATION & CURETTAGE/HYSTEROSCOPY WITH ENDOMETRIAL ABLATION;  Surgeon: Jonnie Kind, MD;  Location: [facility];  Service: Gynecology;  Laterality: N/A;   ESOPHAGOGASTRODUODENOSCOPY  2012   KNEE ARTHROSCOPY Left    x2   MOUTH LESION EXCISIONAL BIOPSY  04/2011   pre-cancerous, pallet of mouth   SHOULDER ARTHROSCOPY WITH ROTATOR CUFF REPAIR Right 08/16/2019   Procedure: Right shoulder arthroscopy, subacromial decompression, distal clavicle resection, rotator cuff repair;  Surgeon: Justice Britain, MD;  Location: [facility];  Service: Orthopedics;  Laterality: Right;  168mn  SHOULDER ARTHROSCOPY WITH ROTATOR CUFF REPAIR Right 10/02/2019   Procedure: SHOULDER ARTHROSCOPY WITH ROTATOR CUFF REPAIR;  Surgeon: Justice Britain, MD;  Location: [facility];  Service: Orthopedics;  Laterality: Right;  96mn    Current Medications: Current Meds  Medication Sig   acetaminophen (TYLENOL) 650 MG CR tablet Take 1,300 mg by mouth every 8 (eight) hours as needed for pain.   DULoxetine (CYMBALTA) 30 MG capsule TAKE 3 CAPSULES BY MOUTH EVERY DAY AT BEDTIME (Patient taking differently: 60 mg at bedtime.)   ipratropium-albuterol (DUONEB) 0.5-2.5 (3) MG/3ML SOLN Take 3 mLs by nebulization every 6 (six) hours as needed.   levalbuterol  (XOPENEX HFA) 45 MCG/ACT inhaler Inhale 1-2 puffs into the lungs daily as needed for wheezing or shortness of breath.   Melatonin 10 MG TABS Take 20 mg by mouth at bedtime.   nitroGLYCERIN (NITROSTAT) 0.4 MG SL tablet Place 1 tablet (0.4 mg total) under the tongue every 5 (five) minutes as needed for chest pain.   omeprazole (PRILOSEC) 20 MG capsule Take 1 capsule (20 mg total) by mouth daily.   progesterone (PROMETRIUM) 200 MG capsule Take 3 capsules (600 mg total) by mouth at bedtime.   sucralfate (CARAFATE) 1 g tablet Take 1 tablet (1 g total) by mouth 4 (four) times daily -  with meals and at bedtime.   sucralfate (CARAFATE) 1 g tablet Take 1 tablet (1 g total) by mouth 4 (four) times daily -  with meals and at bedtime. (Patient taking differently: Take 1 g by mouth 2 (two) times daily after a meal.)     Allergies:   Albuterol, Hydrocodone-acetaminophen, Morphine, and Adhesive [tape]   Social History   Socioeconomic History   Marital status: Married    Spouse name: Not on file   Number of children: 3   Years of education: Not on file   Highest education level: Not on file  Occupational History    Employer: [employer]  Tobacco Use   Smoking status: Every Day    Packs/day: 1.00    Years: 15.00    Total pack years: 15.00    Types: Cigarettes   Smokeless tobacco: Never  Vaping Use   Vaping Use: Never used  Substance and Sexual Activity   Alcohol use: Yes    Comment: occ   Drug use: No   Sexual activity: Yes    Birth control/protection: Surgical  Other Topics Concern   Not on file  Social History Narrative   Not on file   Social Determinants of Health   Financial Resource Strain: Not on file  Food Insecurity: Not on file  Transportation Needs: Not on file  Physical Activity: Not on file  Stress: Not on file  Social Connections: Not on file    Social: Sister his a cardiology nurse, adopting a ten year hold; she has 7 children; husband had a MI 01/2021; he is  doing great.  Family History: The patient's family history includes Atrial fibrillation in her sister; Cerebral aneurysm in her mother; Colon cancer in her maternal aunt and maternal grandmother; Colon cancer (age of onset: 420 in her mother; Diabetes in her brother and sister; Healthy in her sister and son; Heart disease in her father; Hypertension in her sister; Kidney disease in her maternal grandmother; Liver disease in her father; Muscular dystrophy in her brother; Rheum arthritis in her mother.  ROS:   Please see the history of present illness.     All other systems reviewed and are negative.  EKGs/Labs/Other Studies Reviewed:    The following studies were reviewed today:  EKG:  EKG is  ordered today.  The ekg ordered today demonstrates  10/08/21: SR rate 82 WNL  Recent Labs: 09/24/2021: ALT 28; B Natriuretic Peptide 233.0; BUN 14; Creatinine, Ser 0.95; Hemoglobin 14.7; Platelets 318; Potassium 3.7; Sodium 138  Recent Lipid Panel    Component Value Date/Time   CHOL 160 04/23/2020 1130   TRIG 95 04/23/2020 1130   HDL 44 (L) 04/23/2020 1130   CHOLHDL 3.6 04/23/2020 1130   VLDL 9 01/30/2010 0425   LDLCALC 97 04/23/2020 1130        Physical Exam:    VS:  BP 104/70   Pulse 82   Ht '5\' 6"'$  (1.676 m)   Wt 185 lb (83.9 kg)   SpO2 96%   BMI 29.86 kg/m     Wt Readings from Last 3 Encounters:  10/08/21 185 lb (83.9 kg)  09/24/21 180 lb (81.6 kg)  09/06/21 180 lb (81.6 kg)    Gen: no distress  Neck: No JVD Cardiac: No Rubs or Gallops, No Murmur, RRR +2 radial pulses Respiratory: Clear to auscultation bilaterally, normal effort, normal  respiratory rate GI: Soft, nontender, non-distended  MS: No  edema;  moves all extremities  Integument: Skin feels warm; small left ankle scar Neuro:  At time of evaluation, alert and oriented to person/place/time/situation  Psych: Normal affect, patient feels well   ASSESSMENT:    1. Precordial chest pain   2. Precordial pain   3.  Abdominal pain, epigastric   4. Tobacco abuse   5. Peripartum cardiomyopathy    PLAN:    Precordial Pain Abdominal Pain Tobacco Abuse Hx of peripartum cardiomyopathy- Stage A, NYHA I - The patient presents with possibly cardiac pain (improved with nitro) and potentially related to soft plaque disease vs microvascular disease   The 10-year ASCVD risk score (Arnett DK, et al., 2019) is: 2.1%   Values used to calculate the score:     Age: 1 years     Sex: Female     Is Non-Hispanic African American: No     Diabetic: No     Tobacco smoker: Yes     Systolic Blood Pressure: 245 mmHg     Is BP treated: No     HDL Cholesterol: 44 mg/dL     Total Cholesterol: 160 mg/dL  -  ASA 81 mg QD can be continued; would stop if worrisome findings on upcoming EGD - Sublingual nitroglycerin as need for chest pain.   - Would recommend pharmacological nuclear medicine stress test (PET MPI)  - (NPO at midnight, hold caffeine/chocolate for 24 hours); discussed risks, benefits, and alternatives of the diagnostic procedure including chest pain, arrhythmia, and death.  Patient amenable for testing.  Reviewed CTPE with PT. Patient is contemplative for smoking cessation  Fall f/u me or APP       Shared Decision Making/Informed Consent The risks [chest pain, shortness of breath, cardiac arrhythmias, dizziness, blood pressure fluctuations, myocardial infarction, stroke/transient ischemic attack, nausea, vomiting, allergic reaction, radiation exposure, metallic taste sensation and life-threatening complications (estimated to be 1 in 10,000)], benefits (risk stratification, diagnosing coronary artery disease, treatment guidance) and alternatives of a nuclear stress test were discussed in detail with Ms. Star and she agrees to proceed.    Medication Adjustments/Labs and Tests Ordered: Current medicines are reviewed at length with the patient today.  Concerns regarding medicines are outlined above.   Orders Placed This Encounter  Procedures   NM PET CT CARDIAC PERFUSION MULTI W/ABSOLUTE BLOODFLOW   Cardiac Stress Test: Informed Consent Details: Physician/Practitioner Attestation; Transcribe to consent form and obtain patient signature   EKG 12-Lead   Meds ordered this encounter  Medications   nitroGLYCERIN (NITROSTAT) 0.4 MG SL tablet    Sig: Place 1 tablet (0.4 mg total) under the tongue every 5 (five) minutes as needed for chest pain.    Dispense:  25 tablet    Refill:  3    Patient Instructions  Medication Instructions:  Your physician has recommended you make the following change in your medication:  START: Nitroglycerin 0.4 mg under your tongue as needed for Chest pain   If you have chest pain place 1 tablet under your tongue, wait 5 min If chest pain continues place a 2nd tablet under your tongue, wait 5 min If chest pain continues place a 3rd tablet under your tongue, wait 5 min If chest pain continues call 911  *If you need a refill on your cardiac medications before your next appointment, please call your pharmacy*   Lab Work: NONE If you have labs (blood work) drawn today and your tests are completely normal, you will receive your results only by: [place] (if you have MyChart) OR A paper copy in the mail If you have any lab test that is abnormal or we need to change your treatment, we will call you to review the results.   Testing/Procedures: Your physician has requested that you have a Cardiac Pet scan.  Please see instructions listed below.    Follow-Up: At [facility], you and your health needs are our priority.  As part of our continuing mission to provide you with exceptional heart care, we have created designated Provider Care Teams.  These Care Teams include your primary Cardiologist (physician) and Advanced Practice Providers (APPs -  Physician Assistants and Nurse Practitioners) who all work together to provide you with the care you need,  when you need it.   Your next appointment:   4-5 month(s)  The format for your next appointment:   In Person  Provider:  Rudean Haskell, MD, Ermalinda Barrios, PA or Melina Copa, PA      Other Instructions How to Prepare for Your Cardiac PET/CT Stress Test:  1. Please do not take these medications before your test:   Medications that may interfere with the cardiac pharmacological stress agent (ex. nitrates - including erectile dysfunction medications or beta-blockers) the day of the exam. (Erectile dysfunction medication should be held for at least 72 hrs prior to test) Theophylline containing medications for 12 hours. Dipyridamole 48 hours prior to the test. Your remaining medications may be taken with water.  2. Nothing to eat or drink, except water, 3 hours prior to arrival time.   NO caffeine/decaffeinated products, or chocolate 12 hours prior to arrival.  3. NO perfume, cologne or lotion  4. Total time is 1 to 2 hours; you may want to bring reading material for the waiting time.  5. Please report to Admitting at the [facility] Main Entrance 60 minutes early for your test.  [place], [place] [address]  Diabetic Preparation:  Hold oral medications. You may take NPH and Lantus insulin. Do not take Humalog or Humulin R (Regular Insulin) the day of your test. Check blood sugars prior to leaving the house. If able to eat breakfast prior to 3 hour fasting, you may take all medications, including your insulin,  Do not worry if you miss your breakfast dose of insulin - start at your next meal.  IF YOU THINK YOU MAY BE PREGNANT, OR ARE NURSING PLEASE INFORM THE TECHNOLOGIST.  In preparation for your appointment, medication and supplies will be purchased.  Appointment availability is limited, so if you need to cancel or reschedule, please call the Radiology Department at [phone]  24 hours in advance to avoid a cancellation fee of $100.00  What  to Expect After you Arrive:  Once you arrive and check in for your appointment, you will be taken to a preparation room within the Radiology Department.  A technologist or Nurse will obtain your medical history, verify that you are correctly prepped for the exam, and explain the procedure.  Afterwards,  an IV will be started in your arm and electrodes will be placed on your skin for EKG monitoring during the stress portion of the exam. Then you will be escorted to the PET/CT scanner.  There, staff will get you positioned on the scanner and obtain a blood pressure and EKG.  During the exam, you will continue to be connected to the EKG and blood pressure machines.  A small, safe amount of a radioactive tracer will be injected in your IV to obtain a series of pictures of your heart along with an injection of a stress agent.    After your Exam:  It is recommended that you eat a meal and drink a caffeinated beverage to counter act any effects of the stress agent.  Drink plenty of fluids for the remainder of the day and urinate frequently for the first couple of hours after the exam.  Your doctor will inform you of your test results within 7-10 business days.  For questions about your test or how to prepare for your test, please call: Marchia Bond, Cardiac Imaging Nurse Navigator  Gordy Clement, Cardiac Imaging Nurse Navigator Office: [phone]   Important Information About Sugar         Signed, Werner Lean, MD  10/08/2021 8:45 AM    [place]

## 2021-11-04 ENCOUNTER — Encounter: Payer: 59 | Admitting: Gastroenterology

## 2021-11-05 ENCOUNTER — Encounter: Payer: Self-pay | Admitting: Internal Medicine

## 2021-12-05 ENCOUNTER — Encounter (HOSPITAL_BASED_OUTPATIENT_CLINIC_OR_DEPARTMENT_OTHER): Payer: Self-pay | Admitting: Orthopaedic Surgery

## 2022-01-18 ENCOUNTER — Telehealth (HOSPITAL_COMMUNITY): Payer: Self-pay | Admitting: *Deleted

## 2022-01-18 NOTE — Telephone Encounter (Signed)
Reaching out to patient to offer assistance regarding upcoming cardiac imaging study; pt verbalizes understanding of appt date/time, parking situation and where to check in, pre-test NPO status  and verified current allergies; name and call back number provided for further questions should they arise  Kambrie Eddleman RN Navigator Cardiac Imaging  Heart and Vascular [phone] office [phone] cell  Patient aware to avoid caffeine 12 hours prior to her cardiac PET scan. 

## 2022-01-19 ENCOUNTER — Ambulatory Visit (HOSPITAL_COMMUNITY)
Admission: RE | Admit: 2022-01-19 | Discharge: 2022-01-19 | Disposition: A | Discharge status: Home / Self Care | Payer: Self-pay | Source: Ambulatory Visit | Attending: Internal Medicine | Admitting: Internal Medicine

## 2022-01-19 DIAGNOSIS — R072 Precordial pain: Secondary | ICD-10-CM | POA: Insufficient documentation

## 2022-01-19 MED ORDER — RUBIDIUM RB82 GENERATOR (RUBYFILL)
21.7800 | PACK | Freq: Once | INTRAVENOUS | Status: AC
  Administered 2022-01-19: 21.78 via INTRAVENOUS

## 2022-01-19 MED ORDER — RUBIDIUM RB82 GENERATOR (RUBYFILL)
21.8100 | PACK | Freq: Once | INTRAVENOUS | Status: AC
  Administered 2022-01-19: 21.81 via INTRAVENOUS

## 2022-01-19 MED ORDER — REGADENOSON 0.4 MG/5ML IV SOLN
INTRAVENOUS | Status: AC
  Administered 2022-01-19: 0.4 mg via INTRAVENOUS
  Filled 2022-01-19: qty 5

## 2022-01-19 MED ORDER — REGADENOSON 0.4 MG/5ML IV SOLN: 0.4000 mg | Freq: Once | INTRAVENOUS | Status: AC

## 2022-01-19 NOTE — Progress Notes (Signed)
Patient presents for a cardiac PET stress test and tolerated procedure without incident. Patient 's BP did drop to 87/61 near the end of the test.  Patient was pulled out of the scanner and sat up.  Patient did report some left upper chest pain but that resolved after bringing her arms down and sitting.  BP was re-checked after patient sat up and offered caffeine.  Pt's BP was 137/86.  Patient ambulated out of department with a steady gait.

## 2022-01-20 LAB — NM PET CT CARDIAC PERFUSION MULTI W/ABSOLUTE BLOODFLOW
LV dias vol: 68 mL (ref 46–106)
LV sys vol: 18 mL
MBFR: 2.49
Nuc Rest EF: 69 %
Nuc Stress EF: 74 %
Peak HR: 97 {beats}/min
Rest HR: 72 {beats}/min
Rest MBF: 0.94 ml/g/min
Rest Nuclear Isotope Dose: 21.8 mCi
ST Depression (mm): 0 mm
Stress MBF: 2.34 ml/g/min
Stress Nuclear Isotope Dose: 21.8 mCi
TID: 1.07

## 2022-02-01 NOTE — Progress Notes (Deleted)
Cardiology Office Note:    Date:  02/01/2022   ID:  Lisa Ryan, DOB July 27, 1975, MRN [id number]  PCP:  [facility], [place] [facility] Cardiologist:  Werner Lean, MD     Referring MD: [facility]   CC: Chest Pain follow up  History of Present Illness:    Lisa Ryan is a 46 y.o. female with a hx of active tobacco abuse, peripartum cardiomyopathy with recovery (documented in 2011 and 2015) presenting with chest pain.  Seen in 2023 and had normal PET study.  Patient notes that she is doing ***.   Since last visit notes *** . There are no*** interval hospital/ED visit.    No chest pain or pressure ***.  No SOB/DOE*** and no PND/Orthopnea***.  No weight gain or leg swelling***.  No palpitations or syncope ***.  Ambulatory blood pressure ***.   Past Medical History:  Diagnosis Date   Anemia    Anginal pain ([place])    due to asthma meds   Anxiety state, unspecified 10/12/2007   Arthritis    Asthma    CHEST PAIN, ATYPICAL 02/18/2010   after using albuterol   CHEST PAIN-PRECORDIAL 02/19/2010   after albuterol   CHF (congestive heart failure) ([place]) 1999   During Pregnancy, After C section   COPD (chronic obstructive pulmonary disease) ([place])    Diverticulosis    FATIGUE 09/28/2007   Fatty liver    GASTROENTERITIS 10/03/2008   GERD (gastroesophageal reflux disease)    occ takes OTC ttums   History of vitamin D deficiency    Hypothyroidism    MIGRAINE HEADACHE 03/04/2010   Obesity    PEDAL EDEMA 02/18/2010   comes and goes    PEPTIC ULCER DISEASE 09/28/2007   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   PONV (postoperative nausea and vomiting)    SEIZURE DISORDER 09/28/2007   SUI (stress urinary incontinence, female)    Thyroid nodule    Tobacco abuse    WEIGHT GAIN 09/28/2007    Past Surgical History:  Procedure Laterality Date   ANKLE SURGERY     left   APPENDECTOMY     CARDIAC  CATHETERIZATION  2015   CESAREAN SECTION     x3   CHOLECYSTECTOMY     DILITATION & CURRETTAGE/HYSTROSCOPY WITH NOVASURE ABLATION N/A 12/26/2015   Procedure: DILATATION & CURETTAGE/HYSTEROSCOPY WITH ENDOMETRIAL ABLATION;  Surgeon: Jonnie Kind, MD;  Location: [facility];  Service: Gynecology;  Laterality: N/A;   ESOPHAGOGASTRODUODENOSCOPY  2012   KNEE ARTHROSCOPY Left    x2   MOUTH LESION EXCISIONAL BIOPSY  04/2011   pre-cancerous, pallet of mouth   SHOULDER ARTHROSCOPY WITH ROTATOR CUFF REPAIR Right 08/16/2019   Procedure: Right shoulder arthroscopy, subacromial decompression, distal clavicle resection, rotator cuff repair;  Surgeon: Justice Britain, MD;  Location: [facility];  Service: Orthopedics;  Laterality: Right;  163mn   SHOULDER ARTHROSCOPY WITH ROTATOR CUFF REPAIR Right 10/02/2019   Procedure: SHOULDER ARTHROSCOPY WITH ROTATOR CUFF REPAIR;  Surgeon: SJustice Britain MD;  Location: [facility];  Service: Orthopedics;  Laterality: Right;  949m    Current Medications: No outpatient medications have been marked as taking for the 02/02/22 encounter (Appointment) with ChWerner LeanMD.     Allergies:   Albuterol, Hydrocodone-acetaminophen, Morphine, and Adhesive [tape]   Social History   Socioeconomic History   Marital status: Married    Spouse name: Not on file   Number of children: 3   Years of  education: Not on file   Highest education level: Not on file  Occupational History    Employer: [employer]  Tobacco Use   Smoking status: Every Day    Packs/day: 1.00    Years: 15.00    Total pack years: 15.00    Types: Cigarettes   Smokeless tobacco: Never  Vaping Use   Vaping Use: Never used  Substance and Sexual Activity   Alcohol use: Yes    Comment: occ   Drug use: No   Sexual activity: Yes    Birth control/protection: Surgical  Other Topics Concern   Not on file  Social History Narrative   Not on file   Social Determinants of Health   Financial Resource  Strain: Not on file  Food Insecurity: Not on file  Transportation Needs: Not on file  Physical Activity: Not on file  Stress: Not on file  Social Connections: Not on file    Social: Sister is a cardiology nurse, adopting a ten year hold; she has 7 children; husband had a MI 01/2021; he is doing great.  Family History: The patient's family history includes Atrial fibrillation in her sister; Cerebral aneurysm in her mother; Colon cancer in her maternal aunt and maternal grandmother; Colon cancer (age of onset: 45) in her mother; Diabetes in her brother and sister; Healthy in her sister and son; Heart disease in her father; Hypertension in her sister; Kidney disease in her maternal grandmother; Liver disease in her father; Muscular dystrophy in her brother; Rheum arthritis in her mother.  ROS:   Please see the history of present illness.     All other systems reviewed and are negative.  EKGs/Labs/Other Studies Reviewed:    The following studies were reviewed today:  EKG:   10/08/21: SR rate 82 WNL  NM PET CT CARDIAC PERFUSION MULTI W/ABSOLUTE BLOODFLOW 01/19/2022  Narrative   The study is normal. The study is low risk.   LV perfusion is normal. There is no evidence of ischemia. There is no evidence of infarction.   Rest left ventricular function is normal. Rest EF: 69 %. Stress left ventricular function is normal. Stress EF: 74 %. End diastolic cavity size is normal. End systolic cavity size is normal. No evidence of transient ischemic dilation (TID) noted.   Myocardial blood flow was computed to be 0.63m/g/min at rest and 2.351mg/min at stress. Global myocardial blood flow reserve was 2.49 and was normal.   Coronary calcium was absent on the attenuation correction CT images.   Electronically signed by: GaElouise MunroeMD  CLINICAL DATA:  This over-read does not include interpretation of cardiac or coronary anatomy or pathology. The Cardiac PET CT interpretation by the cardiologist  is attached.  COMPARISON:  CT angiography of the chest which was performed on Sep 24, 2021  FINDINGS: Vascular: Please see dedicated report for cardiovascular details.  Mediastinum/Nodes: No adenopathy or acute process in the mediastinum.  Lungs/Pleura: Visualized airways are patent lungs are clear to the extent evaluated.  Upper Abdomen: Incidental imaging of upper abdominal contents without acute process. Mild hepatic steatosis.  Musculoskeletal: No acute or destructive bone findings.  IMPRESSION: No acute or significant extracardiac findings.   Electronically Signed By: GeZetta Bills.D. On: 01/19/2022 12:14     Recent Labs: 09/24/2021: ALT 28; B Natriuretic Peptide 233.0; BUN 14; Creatinine, Ser 0.95; Hemoglobin 14.7; Platelets 318; Potassium 3.7; Sodium 138  Recent Lipid Panel    Component Value Date/Time   CHOL 160 04/23/2020 1130  TRIG 95 04/23/2020 1130   HDL 44 (L) 04/23/2020 1130   CHOLHDL 3.6 04/23/2020 1130   VLDL 9 01/30/2010 0425   LDLCALC 97 04/23/2020 1130        Physical Exam:    VS:  There were no vitals taken for this visit.    Wt Readings from Last 3 Encounters:  10/08/21 185 lb (83.9 kg)  09/24/21 180 lb (81.6 kg)  09/06/21 180 lb (81.6 kg)    Gen: no distress  Neck: No JVD Cardiac: No Rubs or Gallops, No murmur, RRR +2 radial pulses Respiratory: Clear to auscultation bilaterally, normal effort, normal  respiratory rate GI: Soft, nontender, non-distended  MS: No  edema;  moves all extremities  Integument: Skin feels warm; small left ankle scar Neuro:  At time of evaluation, alert and oriented to person/place/time/situation  Psych: Normal affect, patient feels well  ASSESSMENT:    No diagnosis found.  PLAN:     Tobacco Abuse Hx of peripartum cardiomyopathy- Stage A, NYHA I  -  ASA 81 mg QD can be continued; would stop if worrisome findings on upcoming EGD *** - Sublingual nitroglycerin as need for chest pain.   Patient  is contemplative for smoking cessation        Shared Decision Making/Informed Consent The risks [chest pain, shortness of breath, cardiac arrhythmias, dizziness, blood pressure fluctuations, myocardial infarction, stroke/transient ischemic attack, nausea, vomiting, allergic reaction, radiation exposure, metallic taste sensation and life-threatening complications (estimated to be 1 in 10,000)], benefits (risk stratification, diagnosing coronary artery disease, treatment guidance) and alternatives of a nuclear stress test were discussed in detail with Lisa Ryan and she agrees to proceed.    Medication Adjustments/Labs and Tests Ordered: Current medicines are reviewed at length with the patient today.  Concerns regarding medicines are outlined above.  No orders of the defined types were placed in this encounter.  No orders of the defined types were placed in this encounter.   There are no Patient Instructions on file for this visit.   Signed, Werner Lean, MD  02/01/2022 8:40 AM    [place]

## 2022-02-02 ENCOUNTER — Ambulatory Visit: Payer: Self-pay | Attending: Internal Medicine | Admitting: Internal Medicine

## 2022-07-01 ENCOUNTER — Encounter: Payer: Self-pay | Admitting: Radiology

## 2023-03-08 ENCOUNTER — Telehealth: Payer: Self-pay | Admitting: Physician Assistant

## 2023-03-08 NOTE — Progress Notes (Signed)
The patient no-showed for appointment despite this provider sending direct link with no response and waiting for at least 10 minutes from appointment time for patient to join. They will be marked as a NS for this appointment/time.   Maryon Kemnitz M Kenzie Flakes, PA-C    

## 2023-03-10 ENCOUNTER — Telehealth: Payer: Self-pay | Admitting: Nurse Practitioner

## 2023-03-10 DIAGNOSIS — R232 Flushing: Secondary | ICD-10-CM

## 2023-03-10 DIAGNOSIS — F32A Depression, unspecified: Secondary | ICD-10-CM

## 2023-03-10 DIAGNOSIS — G479 Sleep disorder, unspecified: Secondary | ICD-10-CM

## 2023-03-10 MED ORDER — BUPROPION HCL ER (XL) 300 MG PO TB24: 300.0000 mg | ORAL_TABLET | Freq: Every day | ORAL | 0 refills | Status: DC

## 2023-03-10 MED ORDER — DULOXETINE HCL 30 MG PO CPEP: ORAL_CAPSULE | ORAL | 0 refills | Status: DC

## 2023-03-10 MED ORDER — PROGESTERONE 200 MG PO CAPS: 600.0000 mg | ORAL_CAPSULE | Freq: Every evening | ORAL | 0 refills | Status: DC

## 2023-03-10 NOTE — Progress Notes (Signed)
Virtual Visit Consent   Lisa Ryan, you are scheduled for a virtual visit with a [facility] provider today. Just as with appointments in the office, your consent must be obtained to participate. Your consent will be active for this visit and any virtual visit you may have with one of our providers in the next 365 days. If you have a MyChart account, a copy of this consent can be sent to you electronically.  As this is a virtual visit, video technology does not allow for your provider to perform a traditional examination. This may limit your provider's ability to fully assess your condition. If your provider identifies any concerns that need to be evaluated in person or the need to arrange testing (such as labs, EKG, etc.), we will make arrangements to do so. Although advances in technology are sophisticated, we cannot ensure that it will always work on either your end or our end. If the connection with a video visit is poor, the visit may have to be switched to a telephone visit. With either a video or telephone visit, we are not always able to ensure that we have a secure connection.  By engaging in this virtual visit, you consent to the provision of healthcare and authorize for your insurance to be billed (if applicable) for the services provided during this visit. Depending on your insurance coverage, you may receive a charge related to this service.  I need to obtain your verbal consent now. Are you willing to proceed with your visit today? Lisa Ryan has provided verbal consent on 03/10/2023 for a virtual visit (video or telephone). Viviano Simas, FNP  Date: 03/10/2023 6:09 PM  Virtual Visit via Video Note   I, Viviano Simas, connected with  Lisa Ryan  ([id number], 04-15-1976) on 03/10/23 at  6:15 PM EST by a video-enabled telemedicine application and verified that I am speaking with the correct person using two identifiers.  Location: Patient: Virtual Visit  Location Patient: Home Provider: Virtual Visit Location Provider: Home Office   I discussed the limitations of evaluation and management by telemedicine and the availability of in person appointments. The patient expressed understanding and agreed to proceed.    History of Present Illness: Lisa Ryan is a 47 y.o. who identifies as a female who was assigned female at birth, and is being seen today for assistance with medication refills  She has been unable to see her doctor who is in [place] and she has not been seen in a year  She is seen at [facility]   She ran out of her Wellbutrin XL and her Cymbalta 2 days ago  Her Cymbalta is for pain and depression  Her Wellbutrin was initiated to help with smoking cessation, she has continued to smoke but feels improvement with her depression while she is on it.   She takes Progesterone to sleep  She has been on this since 2021   Medications have been stable without SE for the past 3 years  She has had a hard time getting into PCP due to work schedule  Denies SE  Aware of 30 day refill policy and need for in person follow up for future refills   Problems:  Patient Active Problem List   Diagnosis Date Noted   Abdominal pain, epigastric 09/01/2021   Gastroesophageal reflux disease 09/01/2021   LUQ pain 09/01/2021   NSAID long-term use 09/01/2021   Primary osteoarthritis of both hands 11/07/2020   Primary osteoarthritis of right  knee 11/07/2020   Chronic pain of both feet 11/07/2020   S/P right rotator cuff repair 10/02/2019   Hypoxemia    Shortness of breath    Asthma with acute exacerbation 04/09/2018   Tobacco abuse    Abnormal uterine bleeding (AUB) on  12/08/2015   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   Cough 10/08/2013   Bronchitis 10/05/2013   Reactive airway disease 10/05/2013   Former heavy tobacco smoker 10/05/2013   Disorder of thyroid 04/18/2012   Flushing 03/07/2012   Atypical  chest pain 03/07/2012   Gastritis 07/06/2011   Family hx of colon cancer 07/06/2011   Migraine headache 03/04/2010   Precordial chest pain 02/19/2010   PEDAL EDEMA 02/18/2010   Anxiety 10/12/2007   PEPTIC ULCER DISEASE 09/28/2007   SEIZURE DISORDER 09/28/2007   WEIGHT GAIN 09/28/2007    Allergies:  Allergies  Allergen Reactions   Albuterol Anxiety and Other (See Comments)    Increased HR 130s (pt tolerates duoneb)   Hydrocodone-Acetaminophen Nausea And Vomiting   Morphine Other (See Comments)    hallucinations   Adhesive [Tape] Rash    Paper tape is ok   Medications:  Current Outpatient Medications:    acetaminophen (TYLENOL) 650 MG CR tablet, Take 1,300 mg by mouth every 8 (eight) hours as needed for pain., Disp: , Rfl:    DULoxetine (CYMBALTA) 30 MG capsule, TAKE 3 CAPSULES BY MOUTH EVERY DAY AT BEDTIME (Patient taking differently: 60 mg at bedtime.), Disp: 90 capsule, Rfl: 2   ipratropium-albuterol (DUONEB) 0.5-2.5 (3) MG/3ML SOLN, Take 3 mLs by nebulization every 6 (six) hours as needed., Disp: 360 mL, Rfl: 0   levalbuterol (XOPENEX HFA) 45 MCG/ACT inhaler, Inhale 1-2 puffs into the lungs daily as needed for wheezing or shortness of breath., Disp: , Rfl:    Melatonin 10 MG TABS, Take 20 mg by mouth at bedtime., Disp: , Rfl:    nitroGLYCERIN (NITROSTAT) 0.4 MG SL tablet, Place 1 tablet (0.4 mg total) under the tongue every 5 (five) minutes as needed for chest pain., Disp: 25 tablet, Rfl: 3   omeprazole (PRILOSEC) 20 MG capsule, Take 1 capsule (20 mg total) by mouth daily., Disp: 90 capsule, Rfl: 3   progesterone (PROMETRIUM) 200 MG capsule, Take 3 capsules (600 mg total) by mouth at bedtime., Disp: 90 capsule, Rfl: 3   sucralfate (CARAFATE) 1 g tablet, Take 1 tablet (1 g total) by mouth 4 (four) times daily -  with meals and at bedtime., Disp: 15 tablet, Rfl: 0   sucralfate (CARAFATE) 1 g tablet, Take 1 tablet (1 g total) by mouth 4 (four) times daily -  with meals and at bedtime.  (Patient taking differently: Take 1 g by mouth 2 (two) times daily after a meal.), Disp: 120 tablet, Rfl: 3  Observations/Objective: Patient is well-developed, well-nourished in no acute distress.  Resting comfortably  at home.  Head is normocephalic, atraumatic.  No labored breathing.  Speech is clear and coherent with logical content.  Patient is alert and oriented at baseline.    Assessment and Plan:  1. Depression, unspecified depression type  - buPROPion (WELLBUTRIN XL) 300 MG 24 hr tablet; Take 1 tablet (300 mg total) by mouth daily.  Dispense: 30 tablet; Refill: 0 - DULoxetine (CYMBALTA) 30 MG capsule; TAKE 3 CAPSULES BY MOUTH EVERY DAY AT BEDTIME  Dispense: 90 capsule; Refill: 0  2. Hot flashes  - progesterone (PROMETRIUM) 200 MG capsule; Take 3 capsules (600 mg total) by mouth  at bedtime.  Dispense: 90 capsule; Refill: 0  3. Sleep disturbance  - progesterone (PROMETRIUM) 200 MG capsule; Take 3 capsules (600 mg total) by mouth at bedtime.  Dispense: 90 capsule; Refill: 0    Scheduled with new PCP December 6th    Follow Up Instructions: I discussed the assessment and treatment plan with the patient. The patient was provided an opportunity to ask questions and all were answered. The patient agreed with the plan and demonstrated an understanding of the instructions.  A copy of instructions were sent to the patient via MyChart unless otherwise noted below.    The patient was advised to call back or seek an in-person evaluation if the symptoms worsen or if the condition fails to improve as anticipated.    Viviano Simas, FNP

## 2023-03-10 NOTE — Patient Instructions (Addendum)
New Patient Visit:  with Dulce Sellar Friday April 08, 2023 Arrive by 1:45 PM EST Starts at 2:00 PM EST (30 minutes)  [facility] [address] [place] [address] ?[phone]?

## 2023-04-08 ENCOUNTER — Ambulatory Visit: Payer: Self-pay | Admitting: Family

## 2023-04-12 ENCOUNTER — Telehealth: Payer: Self-pay | Admitting: Physician Assistant

## 2023-04-12 DIAGNOSIS — G479 Sleep disorder, unspecified: Secondary | ICD-10-CM

## 2023-04-12 DIAGNOSIS — J069 Acute upper respiratory infection, unspecified: Secondary | ICD-10-CM

## 2023-04-12 DIAGNOSIS — B9689 Other specified bacterial agents as the cause of diseases classified elsewhere: Secondary | ICD-10-CM

## 2023-04-12 DIAGNOSIS — R232 Flushing: Secondary | ICD-10-CM

## 2023-04-12 DIAGNOSIS — F32A Depression, unspecified: Secondary | ICD-10-CM

## 2023-04-12 MED ORDER — DULOXETINE HCL 30 MG PO CPEP: ORAL_CAPSULE | ORAL | 0 refills | Status: AC

## 2023-04-12 MED ORDER — PROMETHAZINE-DM 6.25-15 MG/5ML PO SYRP: 5.0000 mL | ORAL_SOLUTION | Freq: Four times a day (QID) | ORAL | 0 refills | Status: AC | PRN

## 2023-04-12 MED ORDER — BENZONATATE 100 MG PO CAPS: 100.0000 mg | ORAL_CAPSULE | Freq: Three times a day (TID) | ORAL | 0 refills | Status: DC | PRN

## 2023-04-12 MED ORDER — PREDNISONE 20 MG PO TABS: 40.0000 mg | ORAL_TABLET | Freq: Every day | ORAL | 0 refills | Status: DC

## 2023-04-12 MED ORDER — PROGESTERONE 200 MG PO CAPS: 600.0000 mg | ORAL_CAPSULE | Freq: Every evening | ORAL | 0 refills | Status: AC

## 2023-04-12 MED ORDER — AZITHROMYCIN 250 MG PO TABS: ORAL_TABLET | ORAL | 0 refills | Status: AC

## 2023-04-12 MED ORDER — BUPROPION HCL ER (XL) 300 MG PO TB24
300.0000 mg | ORAL_TABLET | Freq: Every day | ORAL | 0 refills | Status: AC
Start: 2023-04-12 — End: 2023-05-12

## 2023-04-12 NOTE — Patient Instructions (Signed)
Lisa Ryan, thank you for joining [name], PA-C for today's virtual visit.  While this provider is not your primary care provider (PCP), if your PCP is located in our provider database this encounter information will be shared with them immediately following your visit.   A [facility] MyChart account gives you access to today's visit and all your visits, tests, and labs performed at [facility] " click here if you don't have a [facility] MyChart account or go to [email or web address]  Consent: (Patient) Asher Clovis Ryan provided verbal consent for this virtual visit at the beginning of the encounter.  Current Medications:  Current Outpatient Medications:    azithromycin (ZITHROMAX) 250 MG tablet, Take 2 tablets on day 1, then 1 tablet daily on days 2 through 5, Disp: 6 tablet, Rfl: 0   benzonatate (TESSALON) 100 MG capsule, Take 1-2 capsules (100-200 mg total) by mouth 3 (three) times daily as needed., Disp: 30 capsule, Rfl: 0   predniSONE (DELTASONE) 20 MG tablet, Take 2 tablets (40 mg total) by mouth daily with breakfast., Disp: 14 tablet, Rfl: 0   promethazine-dextromethorphan (PROMETHAZINE-DM) 6.25-15 MG/5ML syrup, Take 5 mLs by mouth 4 (four) times daily as needed., Disp: 118 mL, Rfl: 0   acetaminophen (TYLENOL) 650 MG CR tablet, Take 1,300 mg by mouth every 8 (eight) hours as needed for pain., Disp: , Rfl:    buPROPion (WELLBUTRIN XL) 300 MG 24 hr tablet, Take 1 tablet (300 mg total) by mouth daily., Disp: 30 tablet, Rfl: 0   DULoxetine (CYMBALTA) 30 MG capsule, TAKE 3 CAPSULES BY MOUTH EVERY DAY AT BEDTIME, Disp: 90 capsule, Rfl: 0   ipratropium-albuterol (DUONEB) 0.5-2.5 (3) MG/3ML SOLN, Take 3 mLs by nebulization every 6 (six) hours as needed., Disp: 360 mL, Rfl: 0   levalbuterol (XOPENEX HFA) 45 MCG/ACT inhaler, Inhale 1-2 puffs into the lungs daily as needed for wheezing or shortness of breath., Disp: , Rfl:    Melatonin 10 MG TABS, Take  20 mg by mouth at bedtime., Disp: , Rfl:    progesterone (PROMETRIUM) 200 MG capsule, Take 3 capsules (600 mg total) by mouth at bedtime., Disp: 90 capsule, Rfl: 0   Medications ordered in this encounter:  Meds ordered this encounter  Medications   azithromycin (ZITHROMAX) 250 MG tablet    Sig: Take 2 tablets on day 1, then 1 tablet daily on days 2 through 5    Dispense:  6 tablet    Refill:  0    Order Specific Question:   Supervising Provider    Answer:   Merrilee Jansky [[id number]]   predniSONE (DELTASONE) 20 MG tablet    Sig: Take 2 tablets (40 mg total) by mouth daily with breakfast.    Dispense:  14 tablet    Refill:  0    Order Specific Question:   Supervising Provider    Answer:   Merrilee Jansky [[id number]]   promethazine-dextromethorphan (PROMETHAZINE-DM) 6.25-15 MG/5ML syrup    Sig: Take 5 mLs by mouth 4 (four) times daily as needed.    Dispense:  118 mL    Refill:  0    Order Specific Question:   Supervising Provider    Answer:   Merrilee Jansky [[id number]]   benzonatate (TESSALON) 100 MG capsule    Sig: Take 1-2 capsules (100-200 mg total) by mouth 3 (three) times daily as needed.    Dispense:  30 capsule    Refill:  0    Order Specific  Question:   Supervising Provider    Answer:   Merrilee Jansky [[id number]]   progesterone (PROMETRIUM) 200 MG capsule    Sig: Take 3 capsules (600 mg total) by mouth at bedtime.    Dispense:  90 capsule    Refill:  0    Please instruct the patient to make follow up appointment before any further refills can be given.    Order Specific Question:   Supervising Provider    Answer:   Merrilee Jansky [[id number]]   buPROPion (WELLBUTRIN XL) 300 MG 24 hr tablet    Sig: Take 1 tablet (300 mg total) by mouth daily.    Dispense:  30 tablet    Refill:  0    Order Specific Question:   Supervising Provider    Answer:   Merrilee Jansky [[id number]]   DULoxetine (CYMBALTA) 30 MG capsule    Sig: TAKE 3 CAPSULES BY MOUTH EVERY DAY AT BEDTIME     Dispense:  90 capsule    Refill:  0    Order Specific Question:   Supervising Provider    Answer:   Merrilee Jansky [id number]     *If you need refills on other medications prior to your next appointment, please contact your pharmacy*  Follow-Up: Call back or seek an in-person evaluation if the symptoms worsen or if the condition fails to improve as anticipated.  [facility] [phone]  Other Instructions Upper Respiratory Infection, Adult An upper respiratory infection (URI) is a common viral infection of the nose, throat, and upper air passages that lead to the lungs. The most common type of URI is the common cold. URIs usually get better on their own, without medical treatment. What are the causes? A URI is caused by a virus. You may catch a virus by: Breathing in droplets from an infected person's cough or sneeze. Touching something that has been exposed to the virus (is contaminated) and then touching your mouth, nose, or eyes. What increases the risk? You are more likely to get a URI if: You are very young or very old. You have close contact with others, such as at work, school, or a health care facility. You smoke. You have long-term (chronic) heart or lung disease. You have a weakened disease-fighting system (immune system). You have nasal allergies or asthma. You are experiencing a lot of stress. You have poor nutrition. What are the signs or symptoms? A URI usually involves some of the following symptoms: Runny or stuffy (congested) nose. Cough. Sneezing. Sore throat. Headache. Fatigue. Fever. Loss of appetite. Pain in your forehead, behind your eyes, and over your cheekbones (sinus pain). Muscle aches. Redness or irritation of the eyes. Pressure in the ears or face. How is this diagnosed? This condition may be diagnosed based on your medical history and symptoms, and a physical exam. Your health care provider may use a swab to take a mucus  sample from your nose (nasal swab). This sample can be tested to determine what virus is causing the illness. How is this treated? URIs usually get better on their own within 7-10 days. Medicines cannot cure URIs, but your health care provider may recommend certain medicines to help relieve symptoms, such as: Over-the-counter cold medicines. Cough suppressants. Coughing is a type of defense against infection that helps to clear the respiratory system, so take these medicines only as recommended by your health care provider. Fever-reducing medicines. Follow these instructions at home: Activity Rest as needed. If  you have a fever, stay home from work or school until your fever is gone or until your health care provider says your URI cannot spread to other people (is no longer contagious). Your health care provider may have you wear a face mask to prevent your infection from spreading. Relieving symptoms Gargle with a mixture of salt and water 3-4 times a day or as needed. To make salt water, completely dissolve -1 tsp (3-6 g) of salt in 1 cup (237 mL) of warm water. Use a cool-mist humidifier to add moisture to the air. This can help you breathe more easily. Eating and drinking  Drink enough fluid to keep your urine pale yellow. Eat soups and other clear broths. General instructions  Take over-the-counter and prescription medicines only as told by your health care provider. These include cold medicines, fever reducers, and cough suppressants. Do not use any products that contain nicotine or tobacco. These products include cigarettes, chewing tobacco, and vaping devices, such as e-cigarettes. If you need help quitting, ask your health care provider. Stay away from secondhand smoke. Stay up to date on all immunizations, including the yearly (annual) flu vaccine. Keep all follow-up visits. This is important. How to prevent the spread of infection to others URIs can be contagious. To prevent the  infection from spreading: Wash your hands with soap and water for at least 20 seconds. If soap and water are not available, use hand sanitizer. Avoid touching your mouth, face, eyes, or nose. Cough or sneeze into a tissue or your sleeve or elbow instead of into your hand or into the air.  Contact a health care provider if: You are getting worse instead of better. You have a fever or chills. Your mucus is brown or red. You have yellow or brown discharge coming from your nose. You have pain in your face, especially when you bend forward. You have swollen neck glands. You have pain while swallowing. You have white areas in the back of your throat. Get help right away if: You have shortness of breath that gets worse. You have severe or persistent: Headache. Ear pain. Sinus pain. Chest pain. You have chronic lung disease along with any of the following: Making high-pitched whistling sounds when you breathe, most often when you breathe out (wheezing). Prolonged cough (more than 14 days). Coughing up blood. A change in your usual mucus. You have a stiff neck. You have changes in your: Vision. Hearing. Thinking. Mood. These symptoms may be an emergency. Get help right away. Call 911. Do not wait to see if the symptoms will go away. Do not drive yourself to the hospital. Summary An upper respiratory infection (URI) is a common infection of the nose, throat, and upper air passages that lead to the lungs. A URI is caused by a virus. URIs usually get better on their own within 7-10 days. Medicines cannot cure URIs, but your health care provider may recommend certain medicines to help relieve symptoms. This information is not intended to replace advice given to you by your health care provider. Make sure you discuss any questions you have with your health care provider. Document Revised: 11/19/2020 Document Reviewed: 11/19/2020 Elsevier Patient Education  2024 Elsevier Inc.    If  you have been instructed to have an in-person evaluation today at a local Urgent Care facility, please use the link below. It will take you to a list of all of our available [facility], including address, phone number and hours of operation.  Please do not delay care.  [facility]  If you or a family member do not have a primary care provider, use the link below to schedule a visit and establish care. When you choose a [facility] primary care physician or advanced practice provider, you gain a long-term partner in health. Find a Primary Care Provider  Learn more about [facility] in-office and virtual care options: [facility] - Get Care Now

## 2023-04-12 NOTE — Progress Notes (Signed)
Virtual Visit Consent   Lisa Ryan, you are scheduled for a virtual visit with a [facility] provider today. Just as with appointments in the office, your consent must be obtained to participate. Your consent will be active for this visit and any virtual visit you may have with one of our providers in the next 365 days. If you have a MyChart account, a copy of this consent can be sent to you electronically.  As this is a virtual visit, video technology does not allow for your provider to perform a traditional examination. This may limit your provider's ability to fully assess your condition. If your provider identifies any concerns that need to be evaluated in person or the need to arrange testing (such as labs, EKG, etc.), we will make arrangements to do so. Although advances in technology are sophisticated, we cannot ensure that it will always work on either your end or our end. If the connection with a video visit is poor, the visit may have to be switched to a telephone visit. With either a video or telephone visit, we are not always able to ensure that we have a secure connection.  By engaging in this virtual visit, you consent to the provision of healthcare and authorize for your insurance to be billed (if applicable) for the services provided during this visit. Depending on your insurance coverage, you may receive a charge related to this service.  I need to obtain your verbal consent now. Are you willing to proceed with your visit today? Lisa Ryan has provided verbal consent on 04/12/2023 for a virtual visit (video or telephone). Lisa Loveless, PA-C  Date: 04/12/2023 2:53 PM  Virtual Visit via Video Note   I, Lisa Ryan, connected with  Lisa Ryan  ([id number], 1975/11/25) on 04/12/23 at  2:45 PM EST by a video-enabled telemedicine application and verified that I am speaking with the correct person using two identifiers.  Location: Patient:  Virtual Visit Location Patient: Home Provider: Virtual Visit Location Provider: Home Office   I discussed the limitations of evaluation and management by telemedicine and the availability of in person appointments. The patient expressed understanding and agreed to proceed.    History of Present Illness: Lisa Ryan is a 47 y.o. who identifies as a female who was assigned female at birth, and is being seen today for URI symptoms.  HPI: URI  This is a new problem. The current episode started 1 to 4 weeks ago. The problem has been gradually worsening. The maximum temperature recorded prior to her arrival was 102 - 102.9 F (102.3 highest). The fever has been present for 1 to 2 days. Associated symptoms include congestion, coughing, ear pain (left), headaches, nausea, rhinorrhea, sinus pain, a sore throat and wheezing. Pertinent negatives include no diarrhea, plugged ear sensation or vomiting. She has tried acetaminophen (nyquil, mucinex) for the symptoms. The treatment provided no relief.    PMH: COPD  Also requesting medication refills since she just ran out again. She was seen on 03/10/23, virtually, by our department and given a one-time 30 day refill. 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   Intrinsic  asthma 10/30/2013   Cough 10/08/2013   Bronchitis 10/05/2013   Reactive airway disease 10/05/2013   Former heavy tobacco smoker 10/05/2013   Disorder of thyroid 04/18/2012   Flushing 03/07/2012   Atypical chest pain 03/07/2012   Gastritis 07/06/2011   Family hx of colon cancer 07/06/2011   Migraine headache 03/04/2010   Precordial chest pain 02/19/2010   PEDAL EDEMA 02/18/2010   Anxiety 10/12/2007   PEPTIC ULCER DISEASE 09/28/2007   SEIZURE DISORDER 09/28/2007   WEIGHT GAIN 09/28/2007    Allergies:  Allergies  Allergen Reactions   Albuterol Anxiety and Other (See Comments)    Increased HR 130s (pt tolerates duoneb)   Hydrocodone-Acetaminophen Nausea And Vomiting   Morphine Other (See Comments)    hallucinations   Adhesive [Tape] Rash    Paper tape is ok   Medications:  Current Outpatient Medications:    azithromycin (ZITHROMAX) 250 MG tablet, Take 2 tablets on day 1, then 1 tablet daily on days 2 through 5, Disp: 6 tablet, Rfl: 0   benzonatate (TESSALON) 100 MG capsule, Take 1-2 capsules (100-200 mg total) by mouth 3 (three) times daily as needed., Disp: 30 capsule, Rfl: 0   predniSONE (DELTASONE) 20 MG tablet, Take 2 tablets (40 mg total) by mouth daily with breakfast., Disp: 14 tablet, Rfl: 0   promethazine-dextromethorphan (PROMETHAZINE-DM) 6.25-15 MG/5ML syrup, Take 5 mLs by mouth 4 (four) times daily as needed., Disp: 118 mL, Rfl: 0   acetaminophen (TYLENOL) 650 MG CR tablet, Take 1,300 mg by mouth every 8 (eight) hours as needed for pain., Disp: , Rfl:    buPROPion (WELLBUTRIN XL) 300 MG 24 hr tablet, Take 1 tablet (300 mg total) by mouth daily., Disp: 30 tablet, Rfl: 0   DULoxetine (CYMBALTA) 30 MG capsule, TAKE 3 CAPSULES BY MOUTH EVERY DAY AT BEDTIME, Disp: 90 capsule, Rfl: 0   ipratropium-albuterol (DUONEB) 0.5-2.5 (3) MG/3ML SOLN, Take 3 mLs by nebulization every 6 (six) hours as needed., Disp: 360 mL, Rfl: 0   levalbuterol (XOPENEX HFA) 45 MCG/ACT inhaler, Inhale  1-2 puffs into the lungs daily as needed for wheezing or shortness of breath., Disp: , Rfl:    Melatonin 10 MG TABS, Take 20 mg by mouth at bedtime., Disp: , Rfl:    progesterone (PROMETRIUM) 200 MG capsule, Take 3 capsules (600 mg total) by mouth at bedtime., Disp: 90 capsule, Rfl: 0  Observations/Objective: Patient is well-developed, well-nourished in no acute distress.  Resting comfortably at home.  Head is normocephalic, atraumatic.  No labored breathing.  Speech is clear and coherent with logical content.  Patient is alert and oriented at baseline.    Assessment and Plan: 1. Bacterial upper respiratory infection - azithromycin (ZITHROMAX) 250 MG tablet; Take 2 tablets on day 1, then 1 tablet daily on days 2 through 5  Dispense: 6 tablet; Refill: 0 - predniSONE (DELTASONE) 20 MG tablet; Take 2 tablets (40 mg total) by mouth daily with breakfast.  Dispense: 14 tablet; Refill: 0 - promethazine-dextromethorphan (PROMETHAZINE-DM) 6.25-15 MG/5ML syrup; Take 5 mLs by mouth 4 (  four) times daily as needed.  Dispense: 118 mL; Refill: 0 - benzonatate (TESSALON) 100 MG capsule; Take 1-2 capsules (100-200 mg total) by mouth 3 (three) times daily as needed.  Dispense: 30 capsule; Refill: 0  2. Hot flashes - progesterone (PROMETRIUM) 200 MG capsule; Take 3 capsules (600 mg total) by mouth at bedtime.  Dispense: 90 capsule; Refill: 0  3. Sleep disturbance - progesterone (PROMETRIUM) 200 MG capsule; Take 3 capsules (600 mg total) by mouth at bedtime.  Dispense: 90 capsule; Refill: 0  4. Depression, unspecified depression type - buPROPion (WELLBUTRIN XL) 300 MG 24 hr tablet; Take 1 tablet (300 mg total) by mouth daily.  Dispense: 30 tablet; Refill: 0 - DULoxetine (CYMBALTA) 30 MG capsule; TAKE 3 CAPSULES BY MOUTH EVERY DAY AT BEDTIME  Dispense: 90 capsule; Refill: 0  - Worsening symptoms that have not responded to OTC medications.  - Will give Azithromycin and Prednisone - Promethazine DM and  Tessalon for cough - Continue PLAIN Mucinex as needed - Steam and humidifier can help - Stay well hydrated and get plenty of rest.  - Medications refilled x 30 days once more; Advised they will not be refilled again by our department and to keep scheduled follow up with new PCP or will require an in-person Urgent Care appt for more refills if not seen by PCP; She voiced understanding - Seek in person evaluation if no symptom improvement or if symptoms worsen   Follow Up Instructions: I discussed the assessment and treatment plan with the patient. The patient was provided an opportunity to ask questions and all were answered. The patient agreed with the plan and demonstrated an understanding of the instructions.  A copy of instructions were sent to the patient via MyChart unless otherwise noted below.    The patient was advised to call back or seek an in-person evaluation if the symptoms worsen or if the condition fails to improve as anticipated.    Lisa Loveless, PA-C

## 2023-04-21 ENCOUNTER — Telehealth: Payer: Self-pay | Admitting: Physician Assistant

## 2023-04-21 DIAGNOSIS — J22 Unspecified acute lower respiratory infection: Secondary | ICD-10-CM

## 2023-04-21 DIAGNOSIS — R051 Acute cough: Secondary | ICD-10-CM

## 2023-04-21 MED ORDER — BENZONATATE 100 MG PO CAPS: 100.0000 mg | ORAL_CAPSULE | Freq: Three times a day (TID) | ORAL | 0 refills | Status: DC | PRN

## 2023-04-21 MED ORDER — METHYLPREDNISOLONE 4 MG PO TBPK: ORAL_TABLET | ORAL | 0 refills | Status: DC

## 2023-04-21 MED ORDER — AZITHROMYCIN 250 MG PO TABS: ORAL_TABLET | ORAL | 0 refills | Status: DC

## 2023-04-21 NOTE — Progress Notes (Signed)
E-Visit for Cough   We are sorry that you are not feeling well.  Here is how we plan to help!  Based on your presentation I believe you most likely have A cough due to bacteria.  When patients have a fever and a productive cough with a change in color or increased sputum production, we are concerned about bacterial bronchitis.  If left untreated it can progress to pneumonia.  If your symptoms do not improve with your treatment plan it is important that you contact your provider.   I have prescribed Azithromyin 250 mg: two tablets now and then one tablet daily for 4 additonal days    In addition you may use A prescription cough medication called Tessalon Perles 100mg . You may take 1-2 capsules every 8 hours as needed for your cough.  Prednisone 4 mg daily for 6 days  From your responses in the eVisit questionnaire you describe inflammation in the upper respiratory tract which is causing a significant cough.  This is commonly called Bronchitis and has four common causes:   Allergies Viral Infections Acid Reflux Bacterial Infection Allergies, viruses and acid reflux are treated by controlling symptoms or eliminating the cause. An example might be a cough caused by taking certain blood pressure medications. You stop the cough by changing the medication. Another example might be a cough caused by acid reflux. Controlling the reflux helps control the cough.  USE OF BRONCHODILATOR ("RESCUE") INHALERS: There is a risk from using your bronchodilator too frequently.  The risk is that over-reliance on a medication which only relaxes the muscles surrounding the breathing tubes can reduce the effectiveness of medications prescribed to reduce swelling and congestion of the tubes themselves.  Although you feel brief relief from the bronchodilator inhaler, your asthma may actually be worsening with the tubes becoming more swollen and filled with mucus.  This can delay other crucial treatments, such as oral  steroid medications. If you need to use a bronchodilator inhaler daily, several times per day, you should discuss this with your provider.  There are probably better treatments that could be used to keep your asthma under control.     HOME CARE Only take medications as instructed by your medical team. Complete the entire course of an antibiotic. Drink plenty of fluids and get plenty of rest. Avoid close contacts especially the very young and the elderly Cover your mouth if you cough or cough into your sleeve. Always remember to wash your hands A steam or ultrasonic humidifier can help congestion.   GET HELP RIGHT AWAY IF: You develop worsening fever. You become short of breath You cough up blood. Your symptoms persist after you have completed your treatment plan MAKE SURE YOU  Understand these instructions. Will watch your condition. Will get help right away if you are not doing well or get worse.    Thank you for choosing an e-visit.  Your e-visit answers were reviewed by a board certified advanced clinical practitioner to complete your personal care plan. Depending upon the condition, your plan could have included both over the counter or prescription medications.  Please review your pharmacy choice. Make sure the pharmacy is open so you can pick up prescription now. If there is a problem, you may contact your provider through [employer] and have the prescription routed to another pharmacy.  Your safety is important to [place]. If you have drug allergies check your prescription carefully.   For the next 24 hours you can use MyChart to  ask questions about today's visit, request a non-urgent call back, or ask for a work or school excuse. You will get an email in the next two days asking about your experience. I hope that your e-visit has been valuable and will speed your recovery.   I have spent 5 minutes in review of e-visit questionnaire, review and updating patient chart, medical  decision making and response to patient.   Gilberto Better, PA-C

## 2024-03-17 ENCOUNTER — Encounter (HOSPITAL_BASED_OUTPATIENT_CLINIC_OR_DEPARTMENT_OTHER): Payer: Self-pay | Admitting: Emergency Medicine

## 2024-03-17 ENCOUNTER — Emergency Department (HOSPITAL_BASED_OUTPATIENT_CLINIC_OR_DEPARTMENT_OTHER)
Admission: EM | Admit: 2024-03-17 | Discharge: 2024-03-17 | Disposition: A | Discharge status: Home / Self Care | Attending: Emergency Medicine | Admitting: Emergency Medicine

## 2024-03-17 DIAGNOSIS — W540XXA Bitten by dog, initial encounter: Secondary | ICD-10-CM | POA: Diagnosis not present

## 2024-03-17 DIAGNOSIS — Z23 Encounter for immunization: Secondary | ICD-10-CM | POA: Diagnosis not present

## 2024-03-17 DIAGNOSIS — S41111A Laceration without foreign body of right upper arm, initial encounter: Secondary | ICD-10-CM | POA: Insufficient documentation

## 2024-03-17 DIAGNOSIS — S81012A Laceration without foreign body, left knee, initial encounter: Secondary | ICD-10-CM | POA: Diagnosis not present

## 2024-03-17 MED ORDER — TETANUS-DIPHTH-ACELL PERTUSSIS 5-2-15.5 LF-MCG/0.5 IM SUSP
0.5000 mL | Freq: Once | INTRAMUSCULAR | Status: AC
  Administered 2024-03-17: 0.5 mL via INTRAMUSCULAR
  Filled 2024-03-17: qty 0.5

## 2024-03-17 MED ORDER — AMOXICILLIN-POT CLAVULANATE 875-125 MG PO TABS
1.0000 | ORAL_TABLET | Freq: Once | ORAL | Status: AC
  Administered 2024-03-17: 1 via ORAL
  Filled 2024-03-17: qty 1

## 2024-03-17 MED ORDER — OXYCODONE HCL 5 MG PO TABS: 5.0000 mg | ORAL_TABLET | Freq: Four times a day (QID) | ORAL | 0 refills | Status: AC | PRN

## 2024-03-17 MED ORDER — AMOXICILLIN-POT CLAVULANATE 875-125 MG PO TABS: 1.0000 | ORAL_TABLET | Freq: Two times a day (BID) | ORAL | 0 refills | Status: DC

## 2024-03-17 MED ORDER — KETOROLAC TROMETHAMINE 15 MG/ML IJ SOLN
15.0000 mg | Freq: Once | INTRAMUSCULAR | Status: AC
  Administered 2024-03-17: 15 mg via INTRAMUSCULAR
  Filled 2024-03-17: qty 1

## 2024-03-17 MED ORDER — LIDOCAINE-EPINEPHRINE (PF) 2 %-1:200000 IJ SOLN
10.0000 mL | Freq: Once | INTRAMUSCULAR | Status: AC
  Administered 2024-03-17: 10 mL
  Filled 2024-03-17: qty 20

## 2024-03-17 MED ORDER — OXYCODONE HCL 5 MG PO TABS
5.0000 mg | ORAL_TABLET | Freq: Once | ORAL | Status: AC
  Administered 2024-03-17: 5 mg via ORAL
  Filled 2024-03-17: qty 1

## 2024-03-17 NOTE — ED Provider Notes (Signed)
 [place] EMERGENCY DEPARTMENT AT [facility] Provider Note   CSN: [id number] Arrival date & time: 03/17/24  1139     Patient presents with: Animal Bite   Lisa Ryan is a 48 y.o. female.   Patient presents today for evaluation of bite wounds from her dog.  Vaccines are up-to-date.  Injury occurred about 45 minutes prior to arrival.  Patient did not perform any wound care at home.  Unknown last tetanus but she is checking on this.  She is able to range all joints.  She sustained bite wounds to her left knee, right wrist and distal forearm area, right upper arm.       Prior to Admission medications   Medication Sig Start Date End Date Taking? Authorizing Provider  acetaminophen  (TYLENOL ) 650 MG CR tablet Take 1,300 mg by mouth every 8 (eight) hours as needed for pain.    [provider]  azithromycin  (ZITHROMAX ) 250 MG tablet Take 2 tablets on day 1, then 1 tablet daily on days 2 through 5 04/21/23   Gandhi, Safal, PA-C  benzonatate  (TESSALON ) 100 MG capsule Take 1-2 capsules (100-200 mg total) by mouth 3 (three) times daily as needed. 04/12/23   Vivienne Delon HERO, PA-C  benzonatate  (TESSALON ) 100 MG capsule Take 1-2 capsules (100-200 mg total) by mouth 3 (three) times daily as needed for cough. 04/21/23   Gandhi, Safal, PA-C  buPROPion  (WELLBUTRIN  XL) 300 MG 24 hr tablet Take 1 tablet (300 mg total) by mouth daily. 04/12/23 05/12/23  Vivienne Delon HERO, PA-C  DULoxetine  (CYMBALTA ) 30 MG capsule TAKE 3 CAPSULES BY MOUTH EVERY DAY AT BEDTIME 04/12/23   Vivienne Delon HERO, PA-C  ipratropium-albuterol  (DUONEB) 0.5-2.5 (3) MG/3ML SOLN Take 3 mLs by nebulization every 6 (six) hours as needed. 04/17/18   Fairy Frames, MD  levalbuterol  (XOPENEX  HFA) 45 MCG/ACT inhaler Inhale 1-2 puffs into the lungs daily as needed for wheezing or shortness of breath.    [provider]  Melatonin 10 MG TABS Take 20 mg by mouth at bedtime.    [provider]   methylPREDNISolone  (MEDROL  DOSEPAK) 4 MG TBPK tablet Take as directed 04/21/23   Gandhi, Safal, PA-C  predniSONE  (DELTASONE ) 20 MG tablet Take 2 tablets (40 mg total) by mouth daily with breakfast. 04/12/23   Vivienne Delon HERO, PA-C  progesterone  (PROMETRIUM ) 200 MG capsule Take 3 capsules (600 mg total) by mouth at bedtime. 04/12/23 05/12/23  Vivienne Delon HERO, PA-C  promethazine -dextromethorphan  (PROMETHAZINE -DM) 6.25-15 MG/5ML syrup Take 5 mLs by mouth 4 (four) times daily as needed. 04/12/23   Vivienne Delon HERO, PA-C    Allergies: Albuterol , Hydrocodone -acetaminophen , Morphine , and Adhesive [tape]    Review of Systems  Updated Vital Signs BP (!) 155/78   Pulse 98   Temp 98.4 F (36.9 C) (Oral)   Resp 20   SpO2 99%   Physical Exam Vitals and nursing note reviewed.  Constitutional:      Appearance: She is well-developed.  HENT:     Head: Normocephalic and atraumatic.  Eyes:     Pupils: Pupils are equal, round, and reactive to light.  Cardiovascular:     Pulses: Normal pulses. No decreased pulses.  Musculoskeletal:        General: Tenderness present.     Cervical back: Normal range of motion and neck supple.     Comments: L knee: 2 small puncture wounds, mild venous oozing.  Right wrist: Multiple small superficial punctures, minor venous oozing.  Full active range of  motion of right wrist.  Right upper arm: Multiple lacerations to the right triceps area.  There is some exposed subcutaneous tissue.  Each approximately 1cm in length. Mild venous oozing.  Skin:    General: Skin is warm and dry.  Neurological:     Mental Status: She is alert.     Sensory: No sensory deficit.     Comments: Motor, sensation, and vascular distal to the injury is fully intact.   Psychiatric:        Mood and Affect: Mood normal.          (all labs ordered are listed, but only abnormal results are displayed) Labs Reviewed - No data to display  EKG: None  Radiology: No  results found.   .Laceration Repair  Date/Time: 03/17/2024 1:09 PM  Performed by: Desiderio Chew, PA-C Authorized by: Desiderio Chew, PA-C   Consent:    Consent obtained:  Verbal   Consent given by:  Patient   Risks discussed:  Infection, pain and retained foreign body   Alternatives discussed:  No treatment Universal protocol:    Patient identity confirmed:  Verbally with patient and provided demographic data Anesthesia:    Anesthesia method:  Local infiltration   Local anesthetic:  Lidocaine  2% WITH epi Laceration details:    Location:  Shoulder/arm   Shoulder/arm location:  R upper arm   Length (cm):  3 Pre-procedure details:    Preparation:  Patient was prepped and draped in usual sterile fashion Exploration:    Wound extent: no foreign body   Treatment:    Area cleansed with:  Shur-Clens   Amount of cleaning:  Standard Skin repair:    Repair method:  Sutures   Suture size:  4-0   Suture material:  Chromic gut   Suture technique:  Simple interrupted   Number of sutures:  3 Approximation:    Approximation:  Loose Repair type:    Repair type:  Simple Post-procedure details:    Dressing:  Open (no dressing)   Procedure completion:  Tolerated well, no immediate complications Comments:     Patient with three 1 cm lacerations that were separate, each closed loosely with 1 suture.  The 2cm linear area with exposed subcutaneous fat, was just subcutaneous tissue that was stuck to the skin.  No underlying laceration. .Laceration Repair  Date/Time: 03/17/2024 1:10 PM  Performed by: Desiderio Chew, PA-C Authorized by: Desiderio Chew, PA-C   Consent:    Consent obtained:  Verbal   Consent given by:  Patient   Risks discussed:  Infection, pain and retained foreign body   Alternatives discussed:  No treatment Universal protocol:    Patient identity confirmed:  Verbally with patient and provided demographic data Anesthesia:    Anesthesia method:  Local infiltration    Local anesthetic:  Lidocaine  2% WITH epi Laceration details:    Location:  Leg   Leg location:  L knee   Length (cm):  1 Exploration:    Wound exploration: wound explored through full range of motion and entire depth of wound visualized     Wound extent: no foreign body   Treatment:    Area cleansed with:  Shur-Clens   Amount of cleaning:  Standard Skin repair:    Repair method:  Sutures   Suture size:  4-0   Suture material:  Chromic gut   Suture technique:  Horizontal mattress   Number of sutures:  1 Approximation:    Approximation:  Close Repair type:  Repair type:  Simple Post-procedure details:    Procedure completion:  Tolerated well, no immediate complications    Medications Ordered in the ED  ketorolac  (TORADOL ) 15 MG/ML injection 15 mg (has no administration in time range)  oxyCODONE  (Oxy IR/ROXICODONE ) immediate release tablet 5 mg (has no administration in time range)  amoxicillin -clavulanate (AUGMENTIN ) 875-125 MG per tablet 1 tablet (has no administration in time range)  lidocaine -EPINEPHrine  (XYLOCAINE  W/EPI) 2 %-1:200000 (PF) injection 10 mL (has no administration in time range)   ED Course  Patient seen and examined. History obtained directly from patient.   Labs/EKG: None ordered  Imaging: Ordered considered x-ray however patient has full range of motion of areas.  No concern for fracture.  Medications/Fluids: Ordered: Toradol , p.o. oxycodone , Augmentin .  Patient will likely need Tdap but she is checking on her last dose in MyChart.  Most recent vital signs reviewed and are as follows: BP (!) 155/78   Pulse 98   Temp 98.4 F (36.9 C) (Oral)   Resp 20   SpO2 99%   Initial impression: Lacerations from dog bite, known animal, vaccines up-to-date per patient report.  We had a discussion in regards to wound closure.  We discussed elevated risk of infection with closure, we discussed possible cosmetic concerns.  After reviewing the wounds, we will  anesthetize and loosely close the right upper arm wounds, especially since there is some exposed subcutaneous tissue in these areas.  1:12 PM Reassessment performed. Patient appears stable.  Wounds were closely closed and cleaned after anesthesia.  Upon flexion of the knee, the laceration on the medial aspect of the knee, opened widely, and decision made to close this as well.  Reviewed pertinent lab work and imaging with patient at bedside. Questions answered.   Most current vital signs reviewed and are as follows: BP (!) 155/78   Pulse 98   Temp 98.4 F (36.9 C) (Oral)   Resp 20   SpO2 99%   Plan: Discharge to home.   Prescriptions written for: Augmentin  x 5 days, oxycodone   Patient counseled on use of narcotic pain medications. Counseled not to combine these medications with others containing tylenol . Urged not to drink alcohol, drive, or perform any other activities that requires focus while taking these medications. The patient verbalizes understanding and agrees with the plan.  Other home care instructions discussed: Patient counseled on wound care.    ED return instructions discussed: Patient was urged to return to the Emergency Department urgently with worsening pain, swelling, expanding erythema especially if it streaks away from the affected area, fever, or if they have any other concerns.   Follow-up instructions discussed: Discussed follow-up with PCP as needed.  Discussed that gut sutures will likely be able to be gently removed manually in about 7-14 days.                                    Medical Decision Making Risk Prescription drug management.   Patient here with multiple dog bites as documented above.  Due to poor approximation of upper arm wounds and left knee wound, decision made to loosely close these with gut suture.  These were cleaned prior to closure.  Discussed increased possibility of infection risk given these areas.  Patient agrees to closure.  She  has full range of motion of all joints.  Wounds explored, low concern for foreign body, underlying fracture.  Patient was started on  Augmentin .  Tetanus was updated.  She was provided with pain control.  The patient's vital signs, pertinent lab work and imaging were reviewed and interpreted as discussed in the ED course. Hospitalization was considered for further testing, treatments, or serial exams/observation. However as patient is well-appearing, has a stable exam, and reassuring studies today, I do not feel that they warrant admission at this time. This plan was discussed with the patient who verbalizes agreement and comfort with this plan and seems reliable and able to return to the Emergency Department with worsening or changing symptoms.       Final diagnoses:  Dog bite, initial encounter    ED Discharge Orders          Ordered    oxyCODONE  (OXY IR/ROXICODONE ) 5 MG immediate release tablet  Every 6 hours PRN        03/17/24 1315    amoxicillin -clavulanate (AUGMENTIN ) 875-125 MG tablet  Every 12 hours        03/17/24 1315               Arthi Mcdonald, PA-C 03/17/24 1317    Dean Clarity, MD 03/17/24 1406

## 2024-03-17 NOTE — ED Notes (Signed)
 Wounds dressed with Telfa guaze and coban

## 2024-03-17 NOTE — ED Triage Notes (Signed)
 Pt caox4 reporting she was bitten by her dog this morning at approx 1100 while sitting in bed. Puncture wounds to R upper arm, R forearm, and L knee. Pt reports dog is vaccinated. Last Tdap unknown.

## 2024-03-17 NOTE — Discharge Instructions (Signed)
 Please read and follow all provided instructions.  Your diagnoses today include:  1. Dog bite, initial encounter     Tests performed today include: Vital signs. See below for your results today.   Medications prescribed:  Augmentin  - antibiotic  You have been prescribed an antibiotic medicine: take the entire course of medicine even if you are feeling better. Stopping early can cause the antibiotic not to work.  Oxycodone  - narcotic pain medication  DO NOT drive or perform any activities that require you to be awake and alert because this medicine can make you drowsy.   Take any prescribed medications only as directed.   Home care instructions:  Follow any educational materials and wound care instructions contained in this packet.   Keep affected area above the level of your heart when possible to minimize swelling. Wash area gently twice a day with warm soapy water. Do not apply alcohol or hydrogen peroxide. Cover the area if it draining or weeping.   Follow-up instructions: Suture Removal: Return to the Emergency Department or see your primary care care doctor as needed for a recheck of your wound.  Return instructions:  Return to the Emergency Department if you have: Fever Worsening pain Worsening swelling of the wound Pus draining from the wound Redness of the skin that moves away from the wound, especially if it streaks away from the affected area  Any other emergent concerns  Your vital signs today were: BP (!) 155/78   Pulse 98   Temp 98.4 F (36.9 C) (Oral)   Resp 20   SpO2 99%  If your blood pressure (BP) was elevated above 135/85 this visit, please have this repeated by your doctor within one month. --------------

## 2024-04-09 ENCOUNTER — Telehealth: Admitting: Physician Assistant

## 2024-04-09 DIAGNOSIS — J441 Chronic obstructive pulmonary disease with (acute) exacerbation: Secondary | ICD-10-CM

## 2024-04-09 MED ORDER — PREDNISONE 20 MG PO TABS: 40.0000 mg | ORAL_TABLET | Freq: Every day | ORAL | 0 refills | Status: AC

## 2024-04-09 MED ORDER — BENZONATATE 100 MG PO CAPS: 100.0000 mg | ORAL_CAPSULE | Freq: Three times a day (TID) | ORAL | 0 refills | Status: AC | PRN

## 2024-04-09 NOTE — Patient Instructions (Signed)
 Lisa Ryan, thank you for joining Lisa Velma Lunger, PA-C for today's virtual visit.  While this provider is not your primary care provider (PCP), if your PCP is located in our provider database this encounter information will be shared with them immediately following your visit.   [name] MyChart account gives you access to today's visit and all your visits, tests, and labs performed at [facility]  click here if you don't have a [facility] MyChart account or go to [email or web address]  Consent: (Patient) Lisa Ryan provided verbal consent for this virtual visit at the beginning of the encounter.  Current Medications:  Current Outpatient Medications:    acetaminophen  (TYLENOL ) 650 MG CR tablet, Take 1,300 mg by mouth every 8 (eight) hours as needed for pain., Disp: , Rfl:    amoxicillin -clavulanate (AUGMENTIN ) 875-125 MG tablet, Take 1 tablet by mouth every 12 (twelve) hours., Disp: 10 tablet, Rfl: 0   azithromycin  (ZITHROMAX ) 250 MG tablet, Take 2 tablets on day 1, then 1 tablet daily on days 2 through 5, Disp: 6 tablet, Rfl: 0   benzonatate  (TESSALON ) 100 MG capsule, Take 1-2 capsules (100-200 mg total) by mouth 3 (three) times daily as needed., Disp: 30 capsule, Rfl: 0   benzonatate  (TESSALON ) 100 MG capsule, Take 1-2 capsules (100-200 mg total) by mouth 3 (three) times daily as needed for cough., Disp: 30 capsule, Rfl: 0   buPROPion  (WELLBUTRIN  XL) 300 MG 24 hr tablet, Take 1 tablet (300 mg total) by mouth daily., Disp: 30 tablet, Rfl: 0   DULoxetine  (CYMBALTA ) 30 MG capsule, TAKE 3 CAPSULES BY MOUTH EVERY DAY AT BEDTIME, Disp: 90 capsule, Rfl: 0   ipratropium-albuterol  (DUONEB) 0.5-2.5 (3) MG/3ML SOLN, Take 3 mLs by nebulization every 6 (six) hours as needed., Disp: 360 mL, Rfl: 0   levalbuterol  (XOPENEX  HFA) 45 MCG/ACT inhaler, Inhale 1-2 puffs into the lungs daily as needed for wheezing or shortness of breath., Disp: , Rfl:    Melatonin 10  MG TABS, Take 20 mg by mouth at bedtime., Disp: , Rfl:    methylPREDNISolone  (MEDROL  DOSEPAK) 4 MG TBPK tablet, Take as directed, Disp: 1 each, Rfl: 0   oxyCODONE  (OXY IR/ROXICODONE ) 5 MG immediate release tablet, Take 1 tablet (5 mg total) by mouth every 6 (six) hours as needed., Disp: 6 tablet, Rfl: 0   predniSONE  (DELTASONE ) 20 MG tablet, Take 2 tablets (40 mg total) by mouth daily with breakfast., Disp: 14 tablet, Rfl: 0   progesterone  (PROMETRIUM ) 200 MG capsule, Take 3 capsules (600 mg total) by mouth at bedtime., Disp: 90 capsule, Rfl: 0   promethazine -dextromethorphan  (PROMETHAZINE -DM) 6.25-15 MG/5ML syrup, Take 5 mLs by mouth 4 (four) times daily as needed., Disp: 118 mL, Rfl: 0   Medications ordered in this encounter:  No orders of the defined types were placed in this encounter.    *If you need refills on other medications prior to your next appointment, please contact your pharmacy*  Follow-Up: Call back or seek an in-person evaluation if the symptoms worsen or if the condition fails to improve as anticipated.  [facility] [phone]  Other Instructions Chronic Obstructive Pulmonary Disease Exacerbation  Chronic obstructive pulmonary disease (COPD) is a long-term (chronic) lung problem. When you have COPD, it can feel harder to breathe in or out. COPD exacerbation is a flare-up of symptoms when breathing gets worse and more treatment may be needed. Without treatment, flare-ups can be life-threatening. If they happen often, your lungs can become  more damaged. What are the causes? Not taking your usual COPD medicines as told by your health care provider. A cold or the flu, which can cause infection in your lungs. Being exposed to things that make your breathing worse, such as: Smoke. Air pollution. Fumes. Dust. Allergies. Weather changes. What are the signs or symptoms? Symptoms do not get better or get worse even if you take your medicines as told by  your provider. Symptoms may include: More shortness of breath. You may only be able to speak one or two words at a time. More coughing or mucus from your lungs. More wheezing or chest tightness. Being more tired and having less energy. Confusion. How is this diagnosed? This condition is diagnosed based on: Symptoms that get worse. Your medical history. A physical exam. You may also have tests, including: A chest X-ray. Blood or mucus tests. How is this treated? You may be able to stay home or you may need to go to the hospital. Treatment may include: Taking medicines. These may include: Inhalers. These have medicines in them that you breathe in. These may be more of what you already take or they may be new. Steroids. These reduce inflammation in the airways. These may be inhaled, taken by mouth, or given in an IV. Antibiotics. These treat infection. Using oxygen. Using a device to help you clear mucus. Follow these instructions at home: Medicines Take your medicines only as told by your provider. If you were given antibiotics or steroids, take them as told by your provider. Do not stop taking them even if you start to feel better. Lifestyle Several times a day, wash your hands with soap and water for at least 20 seconds. If you cannot use soap and water, use hand sanitizer. This may help keep you from getting an infection. Avoid being around crowds or people who are sick. Do not smoke or use any products that contain nicotine  or tobacco. If you need help quitting, ask your provider. Return to your normal activities when your provider says that it's safe. Use breathing methods to control your stress and catch your breath. How is this prevented? Follow your COPD action plan. The action plan tells you what to do if you're feeling good and what to do when you start feeling worse. Discuss the plan often with your provider. Make sure you get all the shots, also called vaccines, that  your provider recommends. Ask your provider about a flu shot and a pneumonia shot. Use oxygen therapy if told by your provider. If you need home oxygen therapy, ask your provider how often to check your oxygen level with a device called an oximeter. Keep all follow-up visits to review your COPD action plan. Your provider will want to check on your condition often to keep you healthy and out of the hospital. Contact a health care provider if: Your COPD symptoms get worse. You have a fever or chills. You have trouble doing daily activities. You have trouble breathing even when you are resting. Get help right away if: You are short of breath and cannot: Talk in full sentences. Do normal activities. You have chest pain. You feel confused. These symptoms may be an emergency. Call 911 right away. Do not wait to see if the symptoms will go away. Do not drive yourself to the hospital. This information is not intended to replace advice given to you by your health care provider. Make sure you discuss any questions you have with your health care  provider. Document Revised: 01/20/2023 Document Reviewed: 07/05/2022 Elsevier Patient Education  2024 [employer]   If you have been instructed to have an in-person evaluation today at a local Urgent Care facility, please use the link below. It will take you to a list of all of our available [facility], including address, phone number and hours of operation. Please do not delay care.  [facility]  If you or a family member do not have a primary care provider, use the link below to schedule a visit and establish care. When you choose a [place] primary care physician or advanced practice provider, you gain a long-term partner in health. Find a Primary Care Provider  Learn more about [facility] in-office and virtual care options:  - Get Care Now

## 2024-04-09 NOTE — Progress Notes (Signed)
 Virtual Visit Consent   Lisa Ryan, you are scheduled for a virtual visit with a [facility] provider today. Just as with appointments in the office, your consent must be obtained to participate. Your consent will be active for this visit and any virtual visit you may have with one of our providers in the next 365 days. If you have a MyChart account, a copy of this consent can be sent to you electronically.  As this is a virtual visit, video technology does not allow for your provider to perform a traditional examination. This may limit your provider's ability to fully assess your condition. If your provider identifies any concerns that need to be evaluated in person or the need to arrange testing (such as labs, EKG, etc.), we will make arrangements to do so. Although advances in technology are sophisticated, we cannot ensure that it will always work on either your end or our end. If the connection with a video visit is poor, the visit may have to be switched to a telephone visit. With either a video or telephone visit, we are not always able to ensure that we have a secure connection.  By engaging in this virtual visit, you consent to the provision of healthcare and authorize for your insurance to be billed (if applicable) for the services provided during this visit. Depending on your insurance coverage, you may receive a charge related to this service.  I need to obtain your verbal consent now. Are you willing to proceed with your visit today? Lisa Ryan has provided verbal consent on 04/09/2024 for a virtual visit (video or telephone). Lisa Ryan, [place]  Date: 04/09/2024 8:50 AM   Virtual Visit via Video Note   I, Lisa Ryan, connected with  Lisa Ryan  ([id number], 08-29-1975) on 04/09/24 at  8:45 AM EST by a video-enabled telemedicine application and verified that I am speaking with the correct person using two identifiers.  Location: Patient:  Virtual Visit Location Patient: Home Provider: Virtual Visit Location Provider: Home Office   I discussed the limitations of evaluation and management by telemedicine and the availability of in person appointments. The patient expressed understanding and agreed to proceed.    History of Present Illness: Lisa Ryan is a 48 y.o. who identifies as a female who was assigned female at birth, and is being seen today for concern of possible COPD exacerbation. Notes increased tightness and wheezing this morning. No change in sputum so far. Denies fever, chills, aches.  Denies recent travel and no sick contact.  Notes she is trying to get ahead of things because typically if not treated quickly she ends up in the hospital.     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   Cough 10/08/2013   Bronchitis 10/05/2013   Reactive airway disease 10/05/2013   Former heavy tobacco smoker 10/05/2013   Disorder  of thyroid  04/18/2012   Flushing 03/07/2012   Atypical chest pain 03/07/2012   Gastritis 07/06/2011   Family hx of colon cancer 07/06/2011   Migraine headache 03/04/2010   Precordial chest pain 02/19/2010   PEDAL EDEMA 02/18/2010   Anxiety 10/12/2007   PEPTIC ULCER DISEASE 09/28/2007   SEIZURE DISORDER 09/28/2007   WEIGHT GAIN 09/28/2007    Allergies:  Allergies  Allergen Reactions   Albuterol  Anxiety and Other (See Comments)     Increased HR 130s (pt tolerates duoneb)   Hydrocodone -Acetaminophen  Nausea And Vomiting   Morphine  Other (See Comments)    hallucinations   Adhesive [Tape] Rash    Paper tape is ok   Medications:  Current Outpatient Medications:    benzonatate  (TESSALON ) 100 MG capsule, Take 1 capsule (100 mg total) by mouth 3 (three) times daily as needed for cough., Disp: 30 capsule, Rfl: 0   predniSONE  (DELTASONE ) 20 MG tablet, Take 2 tablets (40 mg total) by mouth daily with breakfast., Disp: 10 tablet, Rfl: 0   acetaminophen  (TYLENOL ) 650 MG CR tablet, Take 1,300 mg by mouth every 8 (eight) hours as needed for pain., Disp: , Rfl:    buPROPion  (WELLBUTRIN  XL) 300 MG 24 hr tablet, Take 1 tablet (300 mg total) by mouth daily., Disp: 30 tablet, Rfl: 0   DULoxetine  (CYMBALTA ) 30 MG capsule, TAKE 3 CAPSULES BY MOUTH EVERY DAY AT BEDTIME, Disp: 90 capsule, Rfl: 0   ipratropium-albuterol  (DUONEB) 0.5-2.5 (3) MG/3ML SOLN, Take 3 mLs by nebulization every 6 (six) hours as needed., Disp: 360 mL, Rfl: 0   levalbuterol  (XOPENEX  HFA) 45 MCG/ACT inhaler, Inhale 1-2 puffs into the lungs daily as needed for wheezing or shortness of breath., Disp: , Rfl:    Melatonin 10 MG TABS, Take 20 mg by mouth at bedtime., Disp: , Rfl:    oxyCODONE  (OXY IR/ROXICODONE ) 5 MG immediate release tablet, Take 1 tablet (5 mg total) by mouth every 6 (six) hours as needed., Disp: 6 tablet, Rfl: 0   progesterone  (PROMETRIUM ) 200 MG capsule, Take 3 capsules (600 mg total) by mouth at bedtime., Disp: 90 capsule, Rfl: 0   promethazine -dextromethorphan  (PROMETHAZINE -DM) 6.25-15 MG/5ML syrup, Take 5 mLs by mouth 4 (four) times daily as needed., Disp: 118 mL, Rfl: 0  Observations/Objective: Patient is well-developed, well-nourished in no acute distress.  Resting comfortably  at home.  Head is normocephalic, atraumatic.  No labored breathing.  Speech is clear and coherent with logical content.  Patient is alert and oriented at baseline.    Assessment and Plan: 1. COPD exacerbation (HCC) (Primary) - predniSONE  (DELTASONE ) 20 MG tablet; Take 2 tablets (40 mg total) by mouth daily with breakfast.  Dispense: 10 tablet; Refill: 0 - benzonatate  (TESSALON ) 100 MG capsule; Take 1 capsule (100 mg total) by mouth 3 (three) times daily as needed for cough.  Dispense: 30 capsule; Refill: 0  Mild start of COPD exacerbation.  No fever, chills or change in sputum.  She does have a pulse ox at home and has been encouraged to keep a check on her oxygen, seeking an in person evaluation for any decrease in oxygen level (below 90%) despite treatment.  Will start burst of prednisone  to calm down exacerbation.  Tessalon  for cough.  She is to report any new or changing symptoms ASAP.  Strict in person evaluation precautions reviewed.  Follow Up Instructions: I discussed the assessment and treatment plan with the patient. The patient was provided an opportunity to ask questions and all were answered. The  patient agreed with the plan and demonstrated an understanding of the instructions.  A copy of instructions were sent to the patient via MyChart unless otherwise noted below.   The patient was advised to call back or seek an in-person evaluation if the symptoms worsen or if the condition fails to improve as anticipated.    Lisa Velma Lunger, PA-C
